# Patient Record
Sex: Male | Born: 1960
Health system: Southern US, Community
[De-identification: ages and names within clinical notes are randomized; demographics above are authoritative.]

## PROBLEM LIST (undated history)

## (undated) DIAGNOSIS — G4730 Sleep apnea, unspecified: Secondary | ICD-10-CM

## (undated) DIAGNOSIS — J189 Pneumonia, unspecified organism: Secondary | ICD-10-CM

## (undated) DIAGNOSIS — R0902 Hypoxemia: Secondary | ICD-10-CM

## (undated) DIAGNOSIS — G8929 Other chronic pain: Secondary | ICD-10-CM

## (undated) DIAGNOSIS — K9041 Non-celiac gluten sensitivity: Secondary | ICD-10-CM

## (undated) DIAGNOSIS — J449 Chronic obstructive pulmonary disease, unspecified: Secondary | ICD-10-CM

## (undated) DIAGNOSIS — T7840XA Allergy, unspecified, initial encounter: Secondary | ICD-10-CM

## (undated) DIAGNOSIS — M1990 Unspecified osteoarthritis, unspecified site: Secondary | ICD-10-CM

## (undated) HISTORY — DX: Sleep apnea, unspecified: G47.30

## (undated) HISTORY — DX: Unspecified osteoarthritis, unspecified site: M19.90

## (undated) HISTORY — DX: Hypoxemia: R09.02

## (undated) HISTORY — PX: MOLE REMOVAL: SHX2046

## (undated) HISTORY — DX: Non-celiac gluten sensitivity: K90.41

## (undated) HISTORY — DX: Pneumonia, unspecified organism: J18.9

## (undated) HISTORY — PX: BACK SURGERY: SHX140

## (undated) HISTORY — DX: Allergy, unspecified, initial encounter: T78.40XA

## (undated) HISTORY — PX: INGUINAL HERNIA REPAIR: SUR1180

## (undated) HISTORY — DX: Other chronic pain: G89.29

---

## 1981-08-06 DIAGNOSIS — F32A Depression, unspecified: Secondary | ICD-10-CM

## 1981-08-06 HISTORY — DX: Depression, unspecified: F32.A

## 2001-12-15 HISTORY — PX: SPINE SURGERY: SHX786

## 2003-03-04 HISTORY — PX: OTHER SURGICAL HISTORY: SHX169

## 2012-05-12 ENCOUNTER — Ambulatory Visit: Payer: Self-pay | Admitting: Surgery

## 2012-05-12 DIAGNOSIS — Z01810 Encounter for preprocedural cardiovascular examination: Secondary | ICD-10-CM

## 2012-05-12 LAB — BASIC METABOLIC PANEL
Calcium, Total: 8.8 mg/dL (ref 8.5–10.1)
Co2: 28 mmol/L (ref 21–32)
Creatinine: 0.9 mg/dL (ref 0.60–1.30)
Glucose: 65 mg/dL (ref 65–99)
Potassium: 4 mmol/L (ref 3.5–5.1)
Sodium: 138 mmol/L (ref 136–145)

## 2012-05-12 LAB — CBC WITH DIFFERENTIAL/PLATELET
Basophil #: 0.1 10*3/uL (ref 0.0–0.1)
Basophil %: 1 %
HCT: 46 % (ref 40.0–52.0)
HGB: 15.5 g/dL (ref 13.0–18.0)
Lymphocyte #: 2.3 10*3/uL (ref 1.0–3.6)
Lymphocyte %: 29.9 %
MCHC: 33.6 g/dL (ref 32.0–36.0)
Monocyte #: 0.8 x10 3/mm (ref 0.2–1.0)
Neutrophil #: 4.3 10*3/uL (ref 1.4–6.5)
Neutrophil %: 56.3 %
RBC: 4.62 10*6/uL (ref 4.40–5.90)

## 2012-05-17 ENCOUNTER — Ambulatory Visit: Payer: Self-pay | Admitting: Surgery

## 2012-11-11 ENCOUNTER — Ambulatory Visit: Payer: Self-pay | Admitting: Family Medicine

## 2013-03-03 HISTORY — PX: BACK SURGERY: SHX140

## 2013-05-30 ENCOUNTER — Ambulatory Visit: Payer: Self-pay | Admitting: Family Medicine

## 2013-08-31 ENCOUNTER — Ambulatory Visit: Payer: Self-pay | Admitting: Family Medicine

## 2014-02-01 ENCOUNTER — Ambulatory Visit: Payer: Self-pay | Admitting: Family Medicine

## 2014-03-03 HISTORY — PX: NECK SURGERY: SHX720

## 2014-06-23 NOTE — Op Note (Signed)
PATIENT NAME:  Ronald Ellis, Ronald Ellis MR#:  [id number] DATE OF BIRTH:  06-24-1960  DATE OF PROCEDURE:  05/17/2012  PREOPERATIVE DIAGNOSIS: Bilateral recurrent inguinal hernias.   POSTOPERATIVE  DIAGNOSIS:  Bilateral recurrent inguinal hernias.   PROCEDURE:  Laparoscopic preperitoneal repair of bilateral recurrent inguinal hernias.  SURGEON: Phoebe Perch, M.D.   ANESTHESIA: General with endotracheal tube.   INDICATIONS: This is a patient with bilateral recurrent inguinal hernias. He has had  3 operations on the left and 1 on the right. Mesh is present on the left, according to the patient.   Preoperatively, we discussed the rationale for surgery, the options of observation versus risks of bleeding, infection, recurrence, ischemic orchitis, chronic pain syndrome and neuroma. This was all reviewed for he and his wife in the preop holding area. They understood and agreed to proceed.   FINDINGS: Bilateral direct space recurrences. Extensive scar on the left side with palpable but not visible mesh present around the internal ring, extensive scar around the internal ring, and a small indirect space recurrent inguinal indirect sac. On the right side was noted a small indirect sac as well.   DESCRIPTION OF PROCEDURE: The patient was induced to general anesthesia, and given IV antibiotics. VTE prophylaxis was in place. He was prepped and draped in a sterile fashion. Marcaine was infiltrated in the skin and subcutaneous tissues around the periumbilical area (a Foley catheter had been placed as well). An incision was made. Dissection down to the right rectus fascia was performed. It was incised. The muscle retracted laterally. The [place] Surgical dissecting balloon was placed, followed by the [place] Surgical structural balloon, and the preperitoneal space was insufflated; and under direct vision, 2 midline 5 mm ports were placed. Lucious Zou's ligament was delineated. The lateral extent of dissection was determined. The cord was  skeletonized on the right and a small indirect sac was retracted cephalad. The nerve was kept in view at all times laterally.   On the left side, scarified material was brought down bluntly. The hernia sac was retracted cephalad after dissecting it free without making an obvious or visible rent in the hernia sac.   On each side, the pseudosacs from the direct space hernias were retracted and tacked to Eryx Zane's ligament to prevent seroma.   Into the preperitoneal space on each side was placed a large-sized 4 x 6 Atrium laterally-scissored mesh, which was held in place with the [place] Surgical tacker, avoiding the area of the nerve. The preperitoneal space was then desufflated under direct vision. All ports were removed. Fascial edges were approximated with 0 Vicryl figure-of-eight sutures and 4-0 subcuticular Monocryl was used at all skin edges. Steri-Strips, Mastisol and sterile dressings were placed.   The patient tolerated the procedure well. There were no complications. He was taken to the recovery room in stable condition to be discharged in the care of his family. Follow up in 10 days.     ____________________________ Jerrol Banana Burt Knack, MD rec:dm D: 05/17/2012 13:12:00 ET T: 05/17/2012 13:51:45 ET JOB#: [id number]  cc: Jerrol Banana. Burt Knack, MD, <Dictator> Florene Glen MD ELECTRONICALLY SIGNED 05/19/2012 12:28

## 2015-05-13 ENCOUNTER — Emergency Department: Payer: Self-pay

## 2015-05-13 ENCOUNTER — Encounter: Payer: Self-pay | Admitting: Emergency Medicine

## 2015-05-13 ENCOUNTER — Emergency Department
Admission: EM | Admit: 2015-05-13 | Discharge: 2015-05-13 | Disposition: A | Discharge status: Home / Self Care | Payer: Self-pay | Attending: Emergency Medicine | Admitting: Emergency Medicine

## 2015-05-13 DIAGNOSIS — R109 Unspecified abdominal pain: Secondary | ICD-10-CM

## 2015-05-13 DIAGNOSIS — Z87891 Personal history of nicotine dependence: Secondary | ICD-10-CM | POA: Insufficient documentation

## 2015-05-13 DIAGNOSIS — N200 Calculus of kidney: Secondary | ICD-10-CM

## 2015-05-13 HISTORY — DX: Chronic obstructive pulmonary disease, unspecified: J44.9

## 2015-05-13 LAB — URINALYSIS COMPLETE WITH MICROSCOPIC (ARMC ONLY)
BILIRUBIN URINE: NEGATIVE
Bacteria, UA: NONE SEEN
Glucose, UA: NEGATIVE mg/dL
LEUKOCYTES UA: NEGATIVE
Nitrite: NEGATIVE
PH: 5 (ref 5.0–8.0)
PROTEIN: 30 mg/dL — AB
Specific Gravity, Urine: 1.029 (ref 1.005–1.030)

## 2015-05-13 LAB — CBC WITH DIFFERENTIAL/PLATELET
BASOS PCT: 1 %
Basophils Absolute: 0.1 10*3/uL (ref 0–0.1)
EOS ABS: 0.3 10*3/uL (ref 0–0.7)
EOS PCT: 3 %
HCT: 48.2 % (ref 40.0–52.0)
Hemoglobin: 16.5 g/dL (ref 13.0–18.0)
Lymphocytes Relative: 38 %
Lymphs Abs: 4.6 10*3/uL — ABNORMAL HIGH (ref 1.0–3.6)
MCH: 33.5 pg (ref 26.0–34.0)
MCHC: 34.2 g/dL (ref 32.0–36.0)
MCV: 98 fL (ref 80.0–100.0)
MONO ABS: 0.8 10*3/uL (ref 0.2–1.0)
MONOS PCT: 7 %
Neutro Abs: 6.1 10*3/uL (ref 1.4–6.5)
Neutrophils Relative %: 51 %
PLATELETS: 367 10*3/uL (ref 150–440)
RBC: 4.91 MIL/uL (ref 4.40–5.90)
RDW: 13.3 % (ref 11.5–14.5)
WBC: 12 10*3/uL — ABNORMAL HIGH (ref 3.8–10.6)

## 2015-05-13 LAB — COMPREHENSIVE METABOLIC PANEL
ALBUMIN: 4.1 g/dL (ref 3.5–5.0)
ALT: 35 U/L (ref 17–63)
ANION GAP: 8 (ref 5–15)
AST: 26 U/L (ref 15–41)
Alkaline Phosphatase: 65 U/L (ref 38–126)
BILIRUBIN TOTAL: 1.1 mg/dL (ref 0.3–1.2)
BUN: 14 mg/dL (ref 6–20)
CHLORIDE: 108 mmol/L (ref 101–111)
CO2: 22 mmol/L (ref 22–32)
Calcium: 8.8 mg/dL — ABNORMAL LOW (ref 8.9–10.3)
Creatinine, Ser: 1.08 mg/dL (ref 0.61–1.24)
GFR calc Af Amer: >60 mL/min (ref >60)
GFR calc non Af Amer: >60 mL/min (ref >60)
GLUCOSE: 101 mg/dL — AB (ref 65–99)
POTASSIUM: 4.1 mmol/L (ref 3.5–5.1)
SODIUM: 138 mmol/L (ref 135–145)
TOTAL PROTEIN: 7 g/dL (ref 6.5–8.1)

## 2015-05-13 LAB — LIPASE, BLOOD: Lipase: 14 U/L (ref 11–51)

## 2015-05-13 MED ORDER — MORPHINE SULFATE (PF) 4 MG/ML IV SOLN
4.0000 mg | Freq: Once | INTRAVENOUS | Status: AC
  Administered 2015-05-13: 4 mg via INTRAVENOUS
  Filled 2015-05-13: qty 1

## 2015-05-13 MED ORDER — TAMSULOSIN HCL 0.4 MG PO CAPS: 0.4000 mg | ORAL_CAPSULE | Freq: Every day | ORAL | Status: DC

## 2015-05-13 MED ORDER — ONDANSETRON HCL 4 MG/2ML IJ SOLN
4.0000 mg | Freq: Once | INTRAMUSCULAR | Status: AC
  Administered 2015-05-13: 4 mg via INTRAVENOUS
  Filled 2015-05-13: qty 2

## 2015-05-13 MED ORDER — OXYCODONE-ACETAMINOPHEN 5-325 MG PO TABS: 1.0000 | ORAL_TABLET | Freq: Four times a day (QID) | ORAL | Status: DC | PRN

## 2015-05-13 MED ORDER — SODIUM CHLORIDE 0.9 % IV BOLUS (SEPSIS)
1000.0000 mL | Freq: Once | INTRAVENOUS | Status: AC
  Administered 2015-05-13: 1000 mL via INTRAVENOUS

## 2015-05-13 MED ORDER — KETOROLAC TROMETHAMINE 30 MG/ML IJ SOLN
30.0000 mg | Freq: Once | INTRAMUSCULAR | Status: AC
  Administered 2015-05-13: 30 mg via INTRAVENOUS
  Filled 2015-05-13: qty 1

## 2015-05-13 NOTE — ED Notes (Signed)
Pt arrived by POV to ED w/ c/o right side abdominal pain rated at a 10 w/ n/v. Pt has a hx of kidney stones.

## 2015-05-13 NOTE — ED Provider Notes (Signed)
[facility] Emergency Department Provider Note  ____________________________________________  Time seen: Approximately 1:45 PM  I have reviewed the triage vital signs and the nursing notes.   HISTORY  Chief Complaint Abdominal Pain    HPI Ronald Ellis is a 55 y.o. male with a history of kidney stones with presenting to the emergency department today with sudden onset right lower quadrant abdominal pain. He says the pain is sharp and a 10 out of 10. He has not noticed any blood in his urine for the last time he had a stone was about 12-13 years ago. Has vomited multiple times since the pain started. No radiation noted. No difficulty urinating.   Past Medical History  Diagnosis Date  . COPD (chronic obstructive pulmonary disease) (HCC)     There are no active problems to display for this patient.   Past Surgical History  Procedure Laterality Date  . Back surgery    . Hernia repair    . Neck surgery      No current outpatient prescriptions on file.  Allergies Review of patient's allergies indicates no known allergies.  History reviewed. No pertinent family history.  Social History Social History  Substance Use Topics  . Smoking status: Former Research scientist (life sciences)  . Smokeless tobacco: None  . Alcohol Use: None    Review of Systems Constitutional: No fever/chills Eyes: No visual changes. ENT: No sore throat. Cardiovascular: Denies chest pain. Respiratory: Denies shortness of breath. Gastrointestinal: No diarrhea.  No constipation. Genitourinary: Negative for dysuria. Musculoskeletal: Negative for back pain. Skin: Negative for rash. Neurological: Negative for headaches, focal weakness or numbness.  10-point ROS otherwise negative.  ____________________________________________   PHYSICAL EXAM:  VITAL SIGNS: ED Triage Vitals  Enc Vitals Group     BP --      Pulse --      Resp --      Temp --      Temp src --      SpO2 --      Weight --       Height --      Head Cir --      Peak Flow --      Pain Score --      Pain Loc --      Pain Edu? --      Excl. in [place]? --     Constitutional: Alert and oriented. Mild to moderate distress. Cannot find a position of comfort. Has an emesis bag with him on the bed with about 30 cc of clear to brownish tinged vomitus. No coffee grounds. No bright red blood. Eyes: Conjunctivae are normal. PERRL. EOMI. Head: Atraumatic. Nose: No congestion/rhinnorhea. Mouth/Throat: Mucous membranes are moist.   Neck: No stridor.   Cardiovascular: Normal rate, regular rhythm. Grossly normal heart sounds.  Good peripheral circulation. Respiratory: Normal respiratory effort.  No retractions. Lungs CTAB. Gastrointestinal: Soft with right lower quadrant tenderness to palpation. No distention.  No CVA tenderness. Musculoskeletal: No lower extremity tenderness nor edema.  No joint effusions. Neurologic:  Normal speech and language. No gross focal neurologic deficits are appreciated. No gait instability. Skin:  Skin is warm, dry and intact. No rash noted. Psychiatric: Mood and affect are normal. Speech and behavior are normal.  ____________________________________________   LABS (all labs ordered are listed, but only abnormal results are displayed)  Labs Reviewed  URINALYSIS COMPLETEWITH MICROSCOPIC ([facility] ONLY) - Abnormal; Notable for the following:    Color, Urine AMBER (*)    APPearance HAZY (*)  Ketones, ur TRACE (*)    Hgb urine dipstick 3+ (*)    Protein, ur 30 (*)    Squamous Epithelial / LPF 0-5 (*)    All other components within normal limits  CBC WITH DIFFERENTIAL/PLATELET - Abnormal; Notable for the following:    WBC 12.0 (*)    Lymphs Abs 4.6 (*)    All other components within normal limits  COMPREHENSIVE METABOLIC PANEL - Abnormal; Notable for the following:    Glucose, Bld 101 (*)    Calcium 8.8 (*)    All other components within normal limits  LIPASE, BLOOD    ____________________________________________  EKG   ____________________________________________  RADIOLOGY   IMPRESSION: 1. 2 mm right UVJ stone causes mild right hydroureteronephrosis. 2. Other tiny nonobstructing renal calculi.   Electronically Signed By: Misty Stanley M.D. On: 05/13/2015 14:48 ____________________________________________   PROCEDURES   ____________________________________________   INITIAL IMPRESSION / ASSESSMENT AND PLAN / ED COURSE  Pertinent labs & imaging results that were available during my care of the patient were reviewed by me and considered in my medical decision making (see chart for details).  ----------------------------------------- 3:19 PM on 05/13/2015 -----------------------------------------  Patient with 0 out of 10 pain now after Toradol and morphine. He is no longer vomiting. I discussed his lab results as well as his CT imaging which shows a 2 mm right UVJ stone with right mild hydroureteronephrosis. He knows drinking plenty of water home. He'll be discharged with Flomax as well as Percocet. He will follow-up with Dr. Jacqlyn Larsen, who he knows to his wife. We also discussed return precautions is any worsening or concerning symptoms, especially fever, chills worsening pain or nausea or vomiting. Given strainer. ____________________________________________   FINAL CLINICAL IMPRESSION(S) / ED DIAGNOSES  Final diagnoses:  Right flank pain   right-sided kidney stone.    Orbie Pyo, MD 05/13/15 1520

## 2015-05-13 NOTE — Discharge Instructions (Signed)
Kidney Stones °Kidney stones (urolithiasis) are deposits that form inside your kidneys. The intense pain is caused by the stone moving through the urinary tract. When the stone moves, the ureter goes into spasm around the stone. The stone is usually passed in the urine.  °CAUSES  °· A disorder that makes certain neck glands produce too much parathyroid hormone (primary hyperparathyroidism). °· A buildup of uric acid crystals, similar to gout in your joints. °· Narrowing (stricture) of the ureter. °· A kidney obstruction present at birth (congenital obstruction). °· Previous surgery on the kidney or ureters. °· Numerous kidney infections. °SYMPTOMS  °· Feeling sick to your stomach (nauseous). °· Throwing up (vomiting). °· Blood in the urine (hematuria). °· Pain that usually spreads (radiates) to the groin. °· Frequency or urgency of urination. °DIAGNOSIS  °· Taking a history and physical exam. °· Blood or urine tests. °· CT scan. °· Occasionally, an examination of the inside of the urinary bladder (cystoscopy) is performed. °TREATMENT  °· Observation. °· Increasing your fluid intake. °· Extracorporeal shock wave lithotripsy--This is a noninvasive procedure that uses shock waves to break up kidney stones. °· Surgery may be needed if you have severe pain or persistent obstruction. There are various surgical procedures. Most of the procedures are performed with the use of small instruments. Only small incisions are needed to accommodate these instruments, so recovery time is minimized. °The size, location, and chemical composition are all important variables that will determine the proper choice of action for you. Talk to your health care provider to better understand your situation so that you will minimize the risk of injury to yourself and your kidney.  °HOME CARE INSTRUCTIONS  °· Drink enough water and fluids to keep your urine clear or pale yellow. This will help you to pass the stone or stone fragments. °· Strain  all urine through the provided strainer. Keep all particulate matter and stones for your health care provider to see. The stone causing the pain may be as small as a grain of salt. It is very important to use the strainer each and every time you pass your urine. The collection of your stone will allow your health care provider to analyze it and verify that a stone has actually passed. The stone analysis will often identify what you can do to reduce the incidence of recurrences. °· Only take over-the-counter or prescription medicines for pain, discomfort, or fever as directed by your health care provider. °· Keep all follow-up visits as told by your health care provider. This is important. °· Get follow-up X-rays if required. The absence of pain does not always mean that the stone has passed. It may have only stopped moving. If the urine remains completely obstructed, it can cause loss of kidney function or even complete destruction of the kidney. It is your responsibility to make sure X-rays and follow-ups are completed. Ultrasounds of the kidney can show blockages and the status of the kidney. Ultrasounds are not associated with any radiation and can be performed easily in a matter of minutes. °· Make changes to your daily diet as told by your health care provider. You may be told to: °¨ Limit the amount of salt that you eat. °¨ Eat 5 or more servings of fruits and vegetables each day. °¨ Limit the amount of meat, poultry, fish, and eggs that you eat. °· Collect a 24-hour urine sample as told by your health care provider. You may need to collect another urine sample every 6-12   months. °SEEK MEDICAL CARE IF: °· You experience pain that is progressive and unresponsive to any pain medicine you have been prescribed. °SEEK IMMEDIATE MEDICAL CARE IF:  °· Pain cannot be controlled with the prescribed medicine. °· You have a fever or shaking chills. °· The severity or intensity of pain increases over 18 hours and is not  relieved by pain medicine. °· You develop a new onset of abdominal pain. °· You feel faint or pass out. °· You are unable to urinate. °  °This information is not intended to replace advice given to you by your health care provider. Make sure you discuss any questions you have with your health care provider. °  °Document Released: 02/17/2005 Document Revised: 11/08/2014 Document Reviewed: 07/21/2012 °Elsevier Interactive Patient Education ©2016 Else[employer] Inc[employer] ° °

## 2015-07-19 ENCOUNTER — Encounter: Payer: Self-pay | Admitting: Family Medicine

## 2015-07-19 ENCOUNTER — Ambulatory Visit (INDEPENDENT_AMBULATORY_CARE_PROVIDER_SITE_OTHER): Payer: Self-pay | Admitting: Family Medicine

## 2015-07-19 VITALS — BP 108/70 | HR 76 | Temp 98.2°F | Resp 18 | Ht 73.0 in | Wt 196.7 lb

## 2015-07-19 DIAGNOSIS — R5383 Other fatigue: Secondary | ICD-10-CM | POA: Insufficient documentation

## 2015-07-19 DIAGNOSIS — F1190 Opioid use, unspecified, uncomplicated: Secondary | ICD-10-CM

## 2015-07-19 DIAGNOSIS — G8929 Other chronic pain: Principal | ICD-10-CM

## 2015-07-19 DIAGNOSIS — M542 Cervicalgia: Secondary | ICD-10-CM

## 2015-07-19 DIAGNOSIS — M549 Dorsalgia, unspecified: Secondary | ICD-10-CM

## 2015-07-19 NOTE — Progress Notes (Signed)
Name: Ronald Ellis   MRN: [id number]    DOB: 27-Jul-1960   Date:07/19/2015       Progress Note  Subjective  Chief Complaint  Chief Complaint  Patient presents with  . Back Pain    follow up from workers comp  . Neck Pain  . COPD    HPI  Fatigue: Pt. Reports no energy, feels not able to be as active as before (playing with grandkids etc), feeling tired and sluggish, feels like his memory is failing him.  Recently has multiple back and neck surgeries (fusion in cervical spine, and lumbar spine). Now on chronic pain medications (tramadol, Oxycodone, and Tylenol).    Past Medical History  Diagnosis Date  . COPD (chronic obstructive pulmonary disease) [facility])     Past Surgical History  Procedure Laterality Date  . Back surgery    . Hernia repair    . Neck surgery      History reviewed. No pertinent family history.  Social History   Social History  . Marital Status: Single    Spouse Name: N/A  . Number of Children: N/A  . Years of Education: N/A   Occupational History  . Not on file.   Social History Main Topics  . Smoking status: Former Research scientist (life sciences)  . Smokeless tobacco: Not on file  . Alcohol Use: Not on file  . Drug Use: Not on file  . Sexual Activity: Not on file   Other Topics Concern  . Not on file   Social History Narrative     Current outpatient prescriptions:  .  traMADol (ULTRAM-ER) 100 MG 24 hr tablet, Take 100 mg by mouth 4 (four) times daily., Disp: , Rfl:  .  oxyCODONE-acetaminophen (ROXICET) 5-325 MG tablet, Take 1-2 tablets by mouth every 6 (six) hours as needed., Disp: 12 tablet, Rfl: 0  No Known Allergies   Review of Systems  Constitutional: Positive for malaise/fatigue. Negative for fever, chills and weight loss.  Respiratory: Negative for cough.   Cardiovascular: Positive for chest pain (occasional chest tightness.).  Gastrointestinal: Negative for abdominal pain, blood in stool and melena.  Musculoskeletal: Positive for myalgias, back pain  and neck pain.    Objective  Filed Vitals:   07/19/15 1044  BP: 108/70  Pulse: 76  Temp: 98.2 F (36.8 C)  TempSrc: Oral  Resp: 18  Height: 6' 1"  (1.854 m)  Weight: 196 lb 11.2 oz (89.223 kg)  SpO2: 96%    Physical Exam  Constitutional: He is oriented to person, place, and time and well-developed, well-nourished, and in no distress.  HENT:  Head: Normocephalic and atraumatic.  Cardiovascular: Normal rate and regular rhythm.   Pulmonary/Chest: Effort normal and breath sounds normal.  Abdominal: Soft. Bowel sounds are normal.  Musculoskeletal:       Cervical back: He exhibits tenderness, pain and spasm.       Back:  Neurological: He is alert and oriented to person, place, and time.  Psychiatric: Mood, memory, affect and judgment normal.  Nursing note and vitals reviewed.      Assessment & Plan  1. Chronic neck and back pain We'll request records from orthopedic surgery.  2. Chronic, continuous use of opioids We will refer to pain clinic to be able to continue to receive his opioids - Ambulatory referral to Pain Clinic  3. Other fatigue Could be multifactorial (patient has history of depression and has been on SSRI), obtain laboratory evaluation and consider starting back on SSRI if unremarkable - CBC with Differential -  Comprehensive Metabolic Panel (CMET) - TSH - Vitamin D (25 hydroxy)   Ojas Coone Asad A[facility] 07/19/2015 10:50 AM

## 2015-10-15 ENCOUNTER — Ambulatory Visit (INDEPENDENT_AMBULATORY_CARE_PROVIDER_SITE_OTHER): Payer: Self-pay | Admitting: Family Medicine

## 2015-10-15 DIAGNOSIS — M722 Plantar fascial fibromatosis: Secondary | ICD-10-CM | POA: Insufficient documentation

## 2015-10-15 MED ORDER — MELOXICAM 15 MG PO TABS: 15.0000 mg | ORAL_TABLET | Freq: Every day | ORAL | 0 refills | Status: DC

## 2015-10-15 NOTE — Progress Notes (Signed)
Name: Ronald Ellis   MRN: [id number]    DOB: 05/27/1960   Date:10/15/2015       Progress Note  Subjective  Chief Complaint  Chief Complaint  Patient presents with  . Pain    bilateral feet, neck and back    Lower Extremity Injury   Right Foot Pain: Gradual onset,Present for last 2-3 weeks mouth unchanged in intensity, pain present in the heel of right foot, radiates forward to the distal foot, worse in the morning and when he sits down for prolonged periods, no leg pain.  Walking makes it worse,    Past Medical History:  Diagnosis Date  . COPD (chronic obstructive pulmonary disease) ([place])     Past Surgical History:  Procedure Laterality Date  . BACK SURGERY    . HERNIA REPAIR    . NECK SURGERY      No family history on file.  Social History   Social History  . Marital status: Single    Spouse name: N/A  . Number of children: N/A  . Years of education: N/A   Occupational History  . Not on file.   Social History Main Topics  . Smoking status: Former Research scientist (life sciences)  . Smokeless tobacco: Not on file  . Alcohol use Not on file  . Drug use: Unknown  . Sexual activity: Not on file   Other Topics Concern  . Not on file   Social History Narrative  . No narrative on file     Current Outpatient Prescriptions:  .  traMADol (ULTRAM-ER) 100 MG 24 hr tablet, Take 100 mg by mouth 4 (four) times daily., Disp: , Rfl:   No Known Allergies   Review of Systems  Constitutional: Negative for chills and fever.  Musculoskeletal: Positive for joint pain.     Objective  Vitals:   10/15/15 1428  BP: 104/72  Pulse: 100  Resp: 14  Temp: 98.4 F (36.9 C)  TempSrc: Oral  SpO2: 96%  Weight: 194 lb 4.8 oz (88.1 kg)    Physical Exam  Constitutional: He is well-developed, well-nourished, and in no distress.  Musculoskeletal:       Right foot: There is tenderness. There is no swelling.       Feet:  Marked tenderness to palpation over the medial base of the right heel, no  visible erythema, foot is neurovascularly intact, normal range of motion  Psychiatric: Mood, memory, affect and judgment normal.  Nursing note and vitals reviewed.     Assessment & Plan  1. Plantar fasciitis of right foot Obtain x-ray to rule out osseous pathology. Started on NSAID therapy, encouraged stretching exercises, explained the mechanism and possible etiologies for plantar fasciitis. If no relief on conservative therapy, may need referral to podiatry - DG Foot Complete Right; Future - meloxicam (MOBIC) 15 MG tablet; Take 1 tablet (15 mg total) by mouth daily.  Dispense: 30 tablet; Refill: 0   Nita Whitmire Asad A. [facility] 10/15/2015 3:20 PM

## 2015-10-19 ENCOUNTER — Ambulatory Visit: Payer: Self-pay | Admitting: Family Medicine

## 2015-12-05 ENCOUNTER — Encounter: Payer: Self-pay | Admitting: Family Medicine

## 2015-12-05 ENCOUNTER — Ambulatory Visit
Admission: RE | Admit: 2015-12-05 | Discharge: 2015-12-05 | Disposition: A | Discharge status: Home / Self Care | Payer: Self-pay | Source: Ambulatory Visit | Attending: Family Medicine | Admitting: Family Medicine

## 2015-12-05 ENCOUNTER — Ambulatory Visit (INDEPENDENT_AMBULATORY_CARE_PROVIDER_SITE_OTHER): Payer: Self-pay | Admitting: Family Medicine

## 2015-12-05 VITALS — BP 108/70 | HR 73 | Temp 98.9°F | Resp 18 | Ht 73.0 in | Wt 194.2 lb

## 2015-12-05 DIAGNOSIS — M5441 Lumbago with sciatica, right side: Secondary | ICD-10-CM

## 2015-12-05 DIAGNOSIS — M5136 Other intervertebral disc degeneration, lumbar region: Secondary | ICD-10-CM | POA: Insufficient documentation

## 2015-12-05 DIAGNOSIS — R5383 Other fatigue: Secondary | ICD-10-CM

## 2015-12-05 DIAGNOSIS — Z9889 Other specified postprocedural states: Secondary | ICD-10-CM | POA: Insufficient documentation

## 2015-12-05 DIAGNOSIS — F32A Depression, unspecified: Secondary | ICD-10-CM

## 2015-12-05 DIAGNOSIS — M722 Plantar fascial fibromatosis: Secondary | ICD-10-CM

## 2015-12-05 DIAGNOSIS — F329 Major depressive disorder, single episode, unspecified: Secondary | ICD-10-CM

## 2015-12-05 DIAGNOSIS — M79671 Pain in right foot: Secondary | ICD-10-CM | POA: Insufficient documentation

## 2015-12-05 DIAGNOSIS — M7731 Calcaneal spur, right foot: Secondary | ICD-10-CM | POA: Insufficient documentation

## 2015-12-05 MED ORDER — ESCITALOPRAM OXALATE 10 MG PO TABS: 10.0000 mg | ORAL_TABLET | Freq: Every day | ORAL | 0 refills | Status: DC

## 2015-12-05 NOTE — Progress Notes (Signed)
Name: Ronald Ellis   MRN: [id number]    DOB: 07-Jun-1960   Date:12/05/2015       Progress Note  Subjective  Chief Complaint  Chief Complaint  Patient presents with  . Back Pain    pain now radiateing down legs   . Foot Pain    follow up  . Fatigue    Back Pain  This is a chronic problem. The pain is present in the lumbar spine. The pain radiates to the right thigh and right foot. The pain is at a severity of 6/10. Associated symptoms include headaches and leg pain. Pertinent negatives include no bladder incontinence or bowel incontinence. He has tried analgesics (had had lumbar fusion in 2015, now taking Tramadol 50 mg tablets.) for the symptoms.     Past Medical History:  Diagnosis Date  . COPD (chronic obstructive pulmonary disease) ([place])     Past Surgical History:  Procedure Laterality Date  . BACK SURGERY    . HERNIA REPAIR    . NECK SURGERY      No family history on file.  Social History   Social History  . Marital status: Single    Spouse name: N/A  . Number of children: N/A  . Years of education: N/A   Occupational History  . Not on file.   Social History Main Topics  . Smoking status: Former Research scientist (life sciences)  . Smokeless tobacco: Not on file  . Alcohol use Not on file  . Drug use: Unknown  . Sexual activity: Not on file   Other Topics Concern  . Not on file   Social History Narrative  . No narrative on file     Current Outpatient Prescriptions:  .  traMADol (ULTRAM-ER) 100 MG 24 hr tablet, Take 100 mg by mouth 4 (four) times daily., Disp: , Rfl:   No Known Allergies   Review of Systems  Constitutional: Positive for malaise/fatigue.  Gastrointestinal: Negative for bowel incontinence.  Genitourinary: Negative for bladder incontinence.  Musculoskeletal: Positive for back pain and joint pain.  Neurological: Positive for headaches.     Objective  Vitals:   12/05/15 1046  BP: 108/70  Pulse: 73  Resp: 18  Temp: 98.9 F (37.2 C)  TempSrc: Oral    SpO2: 97%  Weight: 194 lb 3.2 oz (88.1 kg)  Height: 6' 1"  (1.854 m)    Physical Exam  Constitutional: He is well-developed, well-nourished, and in no distress.  HENT:  Head: Normocephalic and atraumatic.  Cardiovascular: Normal rate, regular rhythm, S1 normal, S2 normal and normal heart sounds.   No murmur heard. Pulmonary/Chest: Effort normal and breath sounds normal. He has no wheezes. He has no rhonchi.  Musculoskeletal:       Lumbar back: He exhibits tenderness, pain and spasm.       Back:  Right low back tenderness with associated muscle spasm, SLR positive on right side at 60 degrees.  Psychiatric: Mood, memory, affect and judgment normal.  Nursing note and vitals reviewed.      Assessment & Plan  1. Plantar fasciitis of right foot Patient has not been able to obtain the x-ray ordered at last visit. We'll reauthorize x-ray and evaluate.  2. Acute right-sided low back pain with right-sided sciatica Status post lumbar fusion, obtain x-ray of lumbar spine for evaluation - DG Lumbar Spine Complete; Future  3. Fatigue due to depression Started on Lexapro to increase energy, obtain lab work ordered previously to rule out any organic etiology for fatigue. discontinue tramadol. -  escitalopram (LEXAPRO) 10 MG tablet; Take 1 tablet (10 mg total) by mouth at bedtime.  Dispense: 90 tablet; Refill: 0   Caidence Kaseman Asad A[facility] 12/05/2015 11:00 AM

## 2016-01-16 ENCOUNTER — Encounter: Payer: Self-pay | Admitting: Family Medicine

## 2016-01-16 ENCOUNTER — Ambulatory Visit (INDEPENDENT_AMBULATORY_CARE_PROVIDER_SITE_OTHER): Payer: Self-pay | Admitting: Family Medicine

## 2016-01-16 VITALS — BP 110/75 | HR 88 | Temp 98.1°F | Resp 17 | Ht 73.0 in | Wt 200.2 lb

## 2016-01-16 DIAGNOSIS — M722 Plantar fascial fibromatosis: Secondary | ICD-10-CM

## 2016-01-16 DIAGNOSIS — F32A Depression, unspecified: Secondary | ICD-10-CM

## 2016-01-16 DIAGNOSIS — F329 Major depressive disorder, single episode, unspecified: Secondary | ICD-10-CM

## 2016-01-16 DIAGNOSIS — R5383 Other fatigue: Secondary | ICD-10-CM

## 2016-01-16 LAB — COMPREHENSIVE METABOLIC PANEL
ALK PHOS: 72 U/L (ref 40–115)
ALT: 26 U/L (ref 9–46)
AST: 19 U/L (ref 10–35)
Albumin: 4.3 g/dL (ref 3.6–5.1)
BILIRUBIN TOTAL: 0.9 mg/dL (ref 0.2–1.2)
BUN: 11 mg/dL (ref 7–25)
CALCIUM: 9.9 mg/dL (ref 8.6–10.3)
CO2: 26 mmol/L (ref 20–31)
Chloride: 105 mmol/L (ref 98–110)
Creat: 1 mg/dL (ref 0.70–1.33)
GLUCOSE: 101 mg/dL — AB (ref 65–99)
POTASSIUM: 4.9 mmol/L (ref 3.5–5.3)
Sodium: 139 mmol/L (ref 135–146)
TOTAL PROTEIN: 7 g/dL (ref 6.1–8.1)

## 2016-01-16 LAB — TSH: TSH: 1.2 m[IU]/L (ref 0.40–4.50)

## 2016-01-16 MED ORDER — ESCITALOPRAM OXALATE 20 MG PO TABS: 20.0000 mg | ORAL_TABLET | Freq: Every day | ORAL | 0 refills | Status: DC

## 2016-01-16 MED ORDER — MELOXICAM 15 MG PO TABS: 15.0000 mg | ORAL_TABLET | Freq: Every day | ORAL | 2 refills | Status: DC

## 2016-01-16 NOTE — Progress Notes (Signed)
Name: Ronald Ellis   MRN: [id number]    DOB: Nov 25, 1960   Date:01/16/2016       Progress Note  Subjective  Chief Complaint  Chief Complaint  Patient presents with  . Follow-up    6 wk  . Medication Refill    HPI  Fatigue: Pt. Presents for evaluation of fatigue, described as being 'no energy zero focus', started feeling this way in March 2017. He gets 'winter depression' every year but this year, he did not get better with spring and summer. He stays tired all day, lab work to evaluate for the causes of fatigue has not been done. He was started on Lexapro 10 mg daily which does not seem to be helping (but pt. admits to not taking the medication as prescribed).   Past Medical History:  Diagnosis Date  . COPD (chronic obstructive pulmonary disease) ([place])     Past Surgical History:  Procedure Laterality Date  . BACK SURGERY    . HERNIA REPAIR    . NECK SURGERY      History reviewed. No pertinent family history.  Social History   Social History  . Marital status: Single    Spouse name: N/A  . Number of children: N/A  . Years of education: N/A   Occupational History  . Not on file.   Social History Main Topics  . Smoking status: Former Research scientist (life sciences)  . Smokeless tobacco: Never Used  . Alcohol use Not on file  . Drug use: Unknown  . Sexual activity: Not on file   Other Topics Concern  . Not on file   Social History Narrative  . No narrative on file     Current Outpatient Prescriptions:  .  escitalopram (LEXAPRO) 10 MG tablet, Take 1 tablet (10 mg total) by mouth at bedtime., Disp: 90 tablet, Rfl: 0  No Known Allergies   Review of Systems  Constitutional: Positive for malaise/fatigue.  Respiratory: Negative for cough.   Cardiovascular: Negative for chest pain.  Gastrointestinal: Negative for abdominal pain.  Musculoskeletal: Positive for joint pain.  Psychiatric/Behavioral: Positive for depression.    Objective  Vitals:   01/16/16 0959  BP: 110/75    Pulse: 88  Resp: 17  Temp: 98.1 F (36.7 C)  TempSrc: Oral  SpO2: 98%  Weight: 200 lb 3.2 oz (90.8 kg)  Height: 6' 1"  (1.854 m)    Physical Exam  Constitutional: He is oriented to person, place, and time and well-developed, well-nourished, and in no distress.  HENT:  Head: Normocephalic and atraumatic.  Cardiovascular: Normal rate, regular rhythm and normal heart sounds.   No murmur heard. Pulmonary/Chest: Effort normal and breath sounds normal.  Musculoskeletal:       Right foot: There is tenderness. There is no swelling.       Feet:  Neurological: He is alert and oriented to person, place, and time.  Psychiatric: Mood, memory, affect and judgment normal.  Nursing note and vitals reviewed.    Assessment & Plan  1. Fatigue due to depression Increase Lexapro to 20 mg daily, obtain lab work for evaluation of potential causes of fatigue including low testosterone levels - escitalopram (LEXAPRO) 20 MG tablet; Take 1 tablet (20 mg total) by mouth at bedtime.  Dispense: 90 tablet; Refill: 0 - Testosterone  2. Plantar fasciitis of right foot X-ray reveals a calcaneal spur, patient has been taking meloxicam 15 mg daily. Referred to podiatry. - meloxicam (MOBIC) 15 MG tablet; Take 1 tablet (15 mg total) by mouth daily.  Dispense: 30 tablet; Refill: 2 - Ambulatory referral to [place]. [facility] 01/16/2016 10:10 AM

## 2016-01-17 LAB — TESTOSTERONE: Testosterone: 542 ng/dL (ref 250–827)

## 2016-01-17 LAB — CBC WITH DIFFERENTIAL/PLATELET
BASOS PCT: 1 %
Basophils Absolute: 78 cells/uL (ref 0–200)
EOS ABS: 234 {cells}/uL (ref 15–500)
Eosinophils Relative: 3 %
HEMATOCRIT: 50.3 % — AB (ref 38.5–50.0)
HEMOGLOBIN: 16.8 g/dL (ref 13.2–17.1)
LYMPHS ABS: 3042 {cells}/uL (ref 850–3900)
LYMPHS PCT: 39 %
MCH: 33.5 pg — ABNORMAL HIGH (ref 27.0–33.0)
MCHC: 33.4 g/dL (ref 32.0–36.0)
MCV: 100.2 fL — AB (ref 80.0–100.0)
MONO ABS: 624 {cells}/uL (ref 200–950)
MPV: 9.3 fL (ref 7.5–12.5)
Monocytes Relative: 8 %
NEUTROS ABS: 3822 {cells}/uL (ref 1500–7800)
Neutrophils Relative %: 49 %
Platelets: 314 10*3/uL (ref 140–400)
RBC: 5.02 MIL/uL (ref 4.20–5.80)
RDW: 13.8 % (ref 11.0–15.0)
WBC: 7.8 10*3/uL (ref 3.8–10.8)

## 2016-01-17 LAB — VITAMIN D 25 HYDROXY (VIT D DEFICIENCY, FRACTURES): Vit D, 25-Hydroxy: 35 ng/mL (ref 30–100)

## 2016-02-11 ENCOUNTER — Ambulatory Visit: Payer: Self-pay | Admitting: Podiatry

## 2016-02-15 ENCOUNTER — Ambulatory Visit (INDEPENDENT_AMBULATORY_CARE_PROVIDER_SITE_OTHER): Payer: Self-pay | Admitting: Family Medicine

## 2016-02-15 ENCOUNTER — Encounter: Payer: Self-pay | Admitting: Family Medicine

## 2016-02-15 VITALS — BP 120/80 | HR 83 | Temp 98.6°F | Resp 18 | Ht 73.0 in | Wt 200.9 lb

## 2016-02-15 DIAGNOSIS — M549 Dorsalgia, unspecified: Secondary | ICD-10-CM

## 2016-02-15 DIAGNOSIS — M722 Plantar fascial fibromatosis: Secondary | ICD-10-CM

## 2016-02-15 DIAGNOSIS — M542 Cervicalgia: Secondary | ICD-10-CM

## 2016-02-15 DIAGNOSIS — F329 Major depressive disorder, single episode, unspecified: Secondary | ICD-10-CM

## 2016-02-15 DIAGNOSIS — G8929 Other chronic pain: Secondary | ICD-10-CM

## 2016-02-15 DIAGNOSIS — R5383 Other fatigue: Secondary | ICD-10-CM

## 2016-02-15 MED ORDER — HYDROCODONE-ACETAMINOPHEN 5-325 MG PO TABS: 1.0000 | ORAL_TABLET | Freq: Two times a day (BID) | ORAL | 0 refills | Status: DC | PRN

## 2016-02-15 MED ORDER — MELOXICAM 15 MG PO TABS: 15.0000 mg | ORAL_TABLET | Freq: Every day | ORAL | 2 refills | Status: DC

## 2016-02-15 MED ORDER — ESCITALOPRAM OXALATE 20 MG PO TABS: 20.0000 mg | ORAL_TABLET | Freq: Every day | ORAL | 0 refills | Status: DC

## 2016-02-15 NOTE — Progress Notes (Signed)
Name: Ronald Ellis   MRN: [id number]    DOB: 04-May-1960   Date:02/15/2016       Progress Note  Subjective  Chief Complaint  Chief Complaint  Patient presents with  . Follow-up    1 month recheck, medication refills    HPI  Fatigue Has been diagnosed with fatigue, likely caused by depression, started on Lexapro initially at 10 mg, then increase to 20 mg at last visit, feels better, has more energy, no side effects, request consideration of therapy.  Foot Pain Patient has foot pain, rule been diagnosed with plantar fasciitis, taking meloxicam but taking 15 mg twice daily instead of once daily as prescribed because it helps with neck pain, back pain and foot pain. He has been referred to podiatrist but could not keep the appointment and has a follow-up scheduled on December 27.    Past Medical History:  Diagnosis Date  . COPD (chronic obstructive pulmonary disease) ([place])     Past Surgical History:  Procedure Laterality Date  . BACK SURGERY    . HERNIA REPAIR    . NECK SURGERY      No family history on file.  Social History   Social History  . Marital status: Single    Spouse name: N/A  . Number of children: N/A  . Years of education: N/A   Occupational History  . Not on file.   Social History Main Topics  . Smoking status: Former Research scientist (life sciences)  . Smokeless tobacco: Never Used  . Alcohol use Not on file  . Drug use: Unknown  . Sexual activity: Not on file   Other Topics Concern  . Not on file   Social History Narrative  . No narrative on file     Current Outpatient Prescriptions:  .  escitalopram (LEXAPRO) 20 MG tablet, Take 1 tablet (20 mg total) by mouth at bedtime., Disp: 90 tablet, Rfl: 0 .  meloxicam (MOBIC) 15 MG tablet, Take 1 tablet (15 mg total) by mouth daily., Disp: 30 tablet, Rfl: 2  No Known Allergies   ROS  Please see history of present illness for complete discussion of ROS  Objective  Vitals:   02/15/16 1018  BP: 120/80  Pulse: 83    Resp: 18  Temp: 98.6 F (37 C)  TempSrc: Oral  SpO2: 98%  Weight: 200 lb 14.4 oz (91.1 kg)  Height: 6' 1"  (1.854 m)    Physical Exam  Constitutional: He is oriented to person, place, and time and well-developed, well-nourished, and in no distress.  HENT:  Head: Normocephalic and atraumatic.  Cardiovascular: Normal rate, regular rhythm and normal heart sounds.   No murmur heard. Pulmonary/Chest: Effort normal and breath sounds normal.  Musculoskeletal:       Right foot: There is tenderness. There is no swelling.       Feet:  Neurological: He is alert and oriented to person, place, and time.  Psychiatric: Mood, memory, affect and judgment normal.  Nursing note and vitals reviewed.      Assessment & Plan  1. Plantar fasciitis of right foot Advised to take meloxicam 15 mg daily with food, patient has appointment with podiatry - meloxicam (MOBIC) 15 MG tablet; Take 1 tablet (15 mg total) by mouth daily.  Dispense: 30 tablet; Refill: 2  2. Fatigue due to depression Stable and improving, follow up in 3 months - escitalopram (LEXAPRO) 20 MG tablet; Take 1 tablet (20 mg total) by mouth at bedtime.  Dispense: 90 tablet; Refill: 0  3. Chronic neck and back pain Patient has been taking meloxicam 15 mg twice a day as opposed to 15 mg daily as prescribed. He has chronic neck and back pain and states that taking 50 mg twice daily helps relieve both neck and back pain along with the foot pain. Patient had surgery on lumbar spine and cervical spine. We will start on hydrocodone 5 mg twice daily, advised to take medication only as prescribed and as needed. If it works, we'll refer to pain clinic. - HYDROcodone-acetaminophen (NORCO/VICODIN) 5-325 MG tablet; Take 1 tablet by mouth 2 (two) times daily as needed for moderate pain.  Dispense: 60 tablet; Refill: 0   Dayshaun Whobrey [facility] 02/15/2016 10:48 AM

## 2016-02-27 ENCOUNTER — Encounter: Payer: Self-pay | Admitting: Podiatry

## 2016-02-27 ENCOUNTER — Ambulatory Visit (INDEPENDENT_AMBULATORY_CARE_PROVIDER_SITE_OTHER): Payer: Self-pay | Admitting: Podiatry

## 2016-02-27 DIAGNOSIS — M722 Plantar fascial fibromatosis: Secondary | ICD-10-CM

## 2016-02-27 DIAGNOSIS — Z79899 Other long term (current) drug therapy: Secondary | ICD-10-CM

## 2016-02-27 DIAGNOSIS — L603 Nail dystrophy: Secondary | ICD-10-CM

## 2016-02-27 MED ORDER — MELOXICAM 15 MG PO TABS: 15.0000 mg | ORAL_TABLET | Freq: Every day | ORAL | 3 refills | Status: DC

## 2016-02-27 MED ORDER — METHYLPREDNISOLONE 4 MG PO TBPK: ORAL_TABLET | ORAL | 0 refills | Status: DC

## 2016-02-27 NOTE — Progress Notes (Signed)
He presents today as a new patient with a chief complaint of painful right heel times past 6 months. 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 He is also concerned about discoloration of his toenails.  Objective: I have reviewed his past medical history medications allergies surgery social history. Vital signs are stable he is alert and oriented 3. Pulses are strongly palpable. Neurologic sensorium is intact persons once the monofilament deep tendon reflex are intact muscle strength is intact bilateral. He has pain on palpation may continue to go the right heel. Radiographs taken by his primary care provider were reviewed today which do demonstrate plantar distally oriented calcaneal heel spur the soft tissue increase in density of the plantar fascia calcaneal insertion site. He also has some osteoarthritic changes about the first metatarsophalangeal joint. Cutaneous evaluation demonstrates supple hydrated cutis thickening of the hallux nail right possibly secondary to trauma. No open wounds or lesions.  Assessment: Plantar fascitis right nail dystrophy hallux right.  Plan: Discussed etiology pathology conservative versus surgical therapies were discussed for shoe gear stretching exercises ice therapy and shoe modifications. Stretching exercises will be provided to him for plantar fasciitis. I also wrote a prescription for methylprednisolone to be followed by meloxicam. I injected the bilateral heels right heel with Kenalog and local anesthetic and dispensed a plantar fascia brace to be followed by a night splint. Follow up with him in 1 month.

## 2016-02-27 NOTE — Patient Instructions (Signed)

## 2016-03-31 ENCOUNTER — Encounter: Payer: Self-pay | Admitting: Podiatry

## 2016-03-31 ENCOUNTER — Ambulatory Visit (INDEPENDENT_AMBULATORY_CARE_PROVIDER_SITE_OTHER): Payer: Self-pay | Admitting: Podiatry

## 2016-03-31 DIAGNOSIS — B351 Tinea unguium: Secondary | ICD-10-CM

## 2016-03-31 DIAGNOSIS — M79676 Pain in unspecified toe(s): Secondary | ICD-10-CM

## 2016-03-31 DIAGNOSIS — M722 Plantar fascial fibromatosis: Secondary | ICD-10-CM

## 2016-03-31 MED ORDER — TERBINAFINE HCL 250 MG PO TABS: 250.0000 mg | ORAL_TABLET | Freq: Every day | ORAL | 0 refills | Status: DC

## 2016-03-31 NOTE — Progress Notes (Signed)
He presents today for follow-up of his plantar fasciitis and states that he might be 20% better. He states that he was doing much better until just recently where he started to worsen once again. He states that he has not been overly active and he continues his meloxicam. He would like to know the outcome of the pathology.  Objective: Vital signs are stable he is alert and oriented 3. Fungal pathology results demonstrated onychomycosis to his nails. He still has pain on palpation may continue tubercle of the right heel palpable pulses and no calf pain.  Assessment: Pain and limb secondary to onychomycosis and plantar fasciitis right foot.  Plan: After reviewing his most recent labs from November 2017 his liver profile was normal. I started him on Lamisil 250 mg tablets 1 by mouth daily #30 and will follow-up with me in 1 month. We discussed the pros and cons of oral therapy. We also scanned him today for several orthotics and I will follow-up with him once those have arrived.

## 2016-04-28 ENCOUNTER — Encounter: Payer: Self-pay | Admitting: Podiatry

## 2016-04-28 ENCOUNTER — Ambulatory Visit (INDEPENDENT_AMBULATORY_CARE_PROVIDER_SITE_OTHER): Payer: Self-pay | Admitting: Podiatry

## 2016-04-28 DIAGNOSIS — M722 Plantar fascial fibromatosis: Secondary | ICD-10-CM

## 2016-04-28 DIAGNOSIS — Z79899 Other long term (current) drug therapy: Secondary | ICD-10-CM

## 2016-04-28 DIAGNOSIS — L603 Nail dystrophy: Secondary | ICD-10-CM

## 2016-04-28 MED ORDER — TERBINAFINE HCL 250 MG PO TABS
250.0000 mg | ORAL_TABLET | Freq: Every day | ORAL | 0 refills | Status: DC
Start: 2016-04-28 — End: 2017-06-29

## 2016-04-28 NOTE — Progress Notes (Signed)
He presents today for follow-up of his plantar fasciitis. He states that both are feeling very painful at this point. He is also here to pick up his orthotics and be evaluated for nail fungus.  Objective: Vital signs are stable he is alert and oriented 3 denies any problems with the medication for either the foot or the nails. He states he is doing just fine.  Vascular evaluation pulses are intact Extensor was intact deep tendon reflexes are intact he still has pain on palpation medially continue tubercle right heel as well as the left. Toenails appear to be unchanged.  Assessment: Plantar fasciitis bilateral right greater than left. Onychomycosis bilateral.  Plan: Started him on his 90 days of Lamisil today. Blood work was requested. I also dispensed his orthotics today and he was given both oral and written instructions. I also injected the bilateral heels today with Kenalog and local anesthetic. Follow-up with him in 4 months for the nails and 6 weeks with a plantar fasciitis and orthotics.

## 2016-04-30 ENCOUNTER — Telehealth: Payer: Self-pay | Admitting: *Deleted

## 2016-04-30 LAB — HEPATIC FUNCTION PANEL
ALBUMIN: 4.4 g/dL (ref 3.5–5.5)
ALK PHOS: 84 IU/L (ref 39–117)
ALT: 27 IU/L (ref 0–44)
AST: 16 IU/L (ref 0–40)
BILIRUBIN, DIRECT: 0.19 mg/dL (ref 0.00–0.40)
Bilirubin Total: 0.9 mg/dL (ref 0.0–1.2)
TOTAL PROTEIN: 7.2 g/dL (ref 6.0–8.5)

## 2016-04-30 NOTE — Telephone Encounter (Addendum)
-----   Message from Garrel Ridgel, [place] sent at 04/30/2016  7:32 AM EST ----- Blood work looks good and may continue with mediation. Left message informing pt of Dr. Stephenie Acres orders.

## 2016-05-16 ENCOUNTER — Ambulatory Visit: Payer: Self-pay | Admitting: Family Medicine

## 2016-06-09 ENCOUNTER — Encounter: Payer: Self-pay | Admitting: Podiatry

## 2016-06-09 NOTE — Progress Notes (Signed)
This encounter was created in error - please disregard.

## 2016-08-13 ENCOUNTER — Ambulatory Visit: Payer: Self-pay | Admitting: Podiatry

## 2016-10-01 ENCOUNTER — Ambulatory Visit (INDEPENDENT_AMBULATORY_CARE_PROVIDER_SITE_OTHER): Payer: Self-pay | Admitting: Family Medicine

## 2016-10-01 ENCOUNTER — Encounter: Payer: Self-pay | Admitting: Family Medicine

## 2016-10-01 VITALS — BP 120/68 | HR 96 | Temp 98.0°F | Resp 17 | Ht 73.0 in | Wt 200.6 lb

## 2016-10-01 DIAGNOSIS — R0982 Postnasal drip: Secondary | ICD-10-CM

## 2016-10-01 DIAGNOSIS — M62830 Muscle spasm of back: Secondary | ICD-10-CM

## 2016-10-01 MED ORDER — MOMETASONE FUROATE 50 MCG/ACT NA SUSP: 2.0000 | Freq: Every day | NASAL | 0 refills | Status: DC

## 2016-10-01 MED ORDER — BENZONATATE 100 MG PO CAPS: 100.0000 mg | ORAL_CAPSULE | Freq: Three times a day (TID) | ORAL | 0 refills | Status: AC | PRN

## 2016-10-01 MED ORDER — METAXALONE 800 MG PO TABS: 800.0000 mg | ORAL_TABLET | Freq: Three times a day (TID) | ORAL | 0 refills | Status: AC

## 2016-10-01 NOTE — Progress Notes (Signed)
Name: Ronald Ellis   MRN: [id number]    DOB: 10/10/1960   Date:10/01/2016       Progress Note  Subjective  Chief Complaint  Chief Complaint  Patient presents with  . Back Pain  . Cough    Back Pain  This is a recurrent problem. The current episode started in the past 7 days (woke up last week in 'total pain' in his back, since then trying to stretch, ice, and position changes to help relieve the pain.). The pain is present in the lumbar spine. The quality of the pain is described as aching. The pain is at a severity of 7/10. The symptoms are aggravated by position (walking, moving going upstairs makes it worse.). Stiffness is present in the morning. Pertinent negatives include no fever. He has tried analgesics, bed rest, heat and ice for the symptoms.  Cough  This is a recurrent problem. The current episode started in the past 7 days. The cough is productive of sputum (white colored mucus). Associated symptoms include nasal congestion. Pertinent negatives include no chills, ear pain (feels that left ear is clogged up) or fever.      Past Medical History:  Diagnosis Date  . COPD (chronic obstructive pulmonary disease) ([place])     Past Surgical History:  Procedure Laterality Date  . BACK SURGERY    . HERNIA REPAIR    . NECK SURGERY      History reviewed. No pertinent family history.  Social History   Social History  . Marital status: Single    Spouse name: N/A  . Number of children: N/A  . Years of education: N/A   Occupational History  . Not on file.   Social History Main Topics  . Smoking status: Former Research scientist (life sciences)  . Smokeless tobacco: Never Used  . Alcohol use Not on file  . Drug use: Unknown  . Sexual activity: Not on file   Other Topics Concern  . Not on file   Social History Narrative  . No narrative on file     Current Outpatient Prescriptions:  .  escitalopram (LEXAPRO) 20 MG tablet, Take 1 tablet (20 mg total) by mouth at bedtime., Disp: 90 tablet, Rfl:  0 .  meloxicam (MOBIC) 15 MG tablet, Take 1 tablet (15 mg total) by mouth daily., Disp: 30 tablet, Rfl: 3 .  terbinafine (LAMISIL) 250 MG tablet, Take 1 tablet (250 mg total) by mouth daily. (Patient not taking: Reported on 10/01/2016), Disp: 90 tablet, Rfl: 0  No Known Allergies   Review of Systems  Constitutional: Negative for chills and fever.  HENT: Negative for ear pain (feels that left ear is clogged up).   Respiratory: Positive for cough.   Musculoskeletal: Positive for back pain.     Objective  Vitals:   10/01/16 1116  BP: 120/68  Pulse: 96  Resp: 17  Temp: 98 F (36.7 C)  TempSrc: Oral  SpO2: 98%  Weight: 200 lb 9.6 oz (91 kg)  Height: 6' 1"  (1.854 m)    Physical Exam  Constitutional: He is oriented to person, place, and time and well-developed, well-nourished, and in no distress.  HENT:  Head: Normocephalic and atraumatic.  Right Ear: Tympanic membrane and ear canal normal. No drainage or swelling.  Left Ear: Tympanic membrane and ear canal normal. No drainage or swelling.  Nose: Right sinus exhibits maxillary sinus tenderness. Left sinus exhibits maxillary sinus tenderness.  Mouth/Throat: Posterior oropharyngeal erythema present.  Cardiovascular: Normal rate, regular rhythm, S1 normal, S2 normal  and normal heart sounds.   No murmur heard. Pulmonary/Chest: Effort normal and breath sounds normal. He has no wheezes. He has no rhonchi.  Musculoskeletal:       Lumbar back: He exhibits tenderness, pain and spasm.       Back:  Neurological: He is alert and oriented to person, place, and time.  Psychiatric: Mood, memory, affect and judgment normal.  Nursing note and vitals reviewed.     Assessment & Plan  1. Spasm of muscle of lower back History of L4-5 fusion, likely associated muscle spasm and arthritis, starting Skelaxin - metaxalone (SKELAXIN) 800 MG tablet; Take 1 tablet (800 mg total) by mouth 3 (three) times daily.  Dispense: 21 tablet; Refill: 0  2.  Post-nasal drainage Antibiotic not indicated at this time, start on Tessalon and Nasonex spray - benzonatate (TESSALON) 100 MG capsule; Take 1 capsule (100 mg total) by mouth 3 (three) times daily as needed for cough.  Dispense: 20 capsule; Refill: 0 - mometasone (NASONEX) 50 MCG/ACT nasal spray; Place 2 sprays into the nose daily.  Dispense: 17 g; Refill: 0   [name] [facility] 10/01/2016 11:24 AM

## 2017-01-02 ENCOUNTER — Ambulatory Visit: Payer: Self-pay | Admitting: Family Medicine

## 2017-06-29 ENCOUNTER — Ambulatory Visit
Admission: RE | Admit: 2017-06-29 | Discharge: 2017-06-29 | Disposition: A | Discharge status: Home / Self Care | Payer: Medicare Other | Source: Ambulatory Visit | Attending: Family Medicine | Admitting: Family Medicine

## 2017-06-29 ENCOUNTER — Ambulatory Visit (INDEPENDENT_AMBULATORY_CARE_PROVIDER_SITE_OTHER): Payer: Medicare Other | Admitting: Family Medicine

## 2017-06-29 ENCOUNTER — Encounter: Payer: Self-pay | Admitting: Family Medicine

## 2017-06-29 VITALS — BP 110/82 | HR 71 | Temp 98.6°F | Resp 16 | Ht 73.0 in | Wt 198.3 lb

## 2017-06-29 DIAGNOSIS — R0982 Postnasal drip: Secondary | ICD-10-CM

## 2017-06-29 DIAGNOSIS — M542 Cervicalgia: Secondary | ICD-10-CM | POA: Diagnosis not present

## 2017-06-29 DIAGNOSIS — F338 Other recurrent depressive disorders: Secondary | ICD-10-CM | POA: Diagnosis not present

## 2017-06-29 DIAGNOSIS — J441 Chronic obstructive pulmonary disease with (acute) exacerbation: Secondary | ICD-10-CM

## 2017-06-29 DIAGNOSIS — R05 Cough: Secondary | ICD-10-CM | POA: Insufficient documentation

## 2017-06-29 DIAGNOSIS — R61 Generalized hyperhidrosis: Secondary | ICD-10-CM

## 2017-06-29 DIAGNOSIS — M62830 Muscle spasm of back: Secondary | ICD-10-CM | POA: Diagnosis not present

## 2017-06-29 DIAGNOSIS — Z114 Encounter for screening for human immunodeficiency virus [HIV]: Secondary | ICD-10-CM | POA: Diagnosis not present

## 2017-06-29 DIAGNOSIS — R718 Other abnormality of red blood cells: Secondary | ICD-10-CM

## 2017-06-29 DIAGNOSIS — R5383 Other fatigue: Secondary | ICD-10-CM

## 2017-06-29 DIAGNOSIS — E7800 Pure hypercholesterolemia, unspecified: Secondary | ICD-10-CM | POA: Diagnosis not present

## 2017-06-29 DIAGNOSIS — R059 Cough, unspecified: Secondary | ICD-10-CM

## 2017-06-29 DIAGNOSIS — Z23 Encounter for immunization: Secondary | ICD-10-CM | POA: Diagnosis not present

## 2017-06-29 DIAGNOSIS — Z79899 Other long term (current) drug therapy: Secondary | ICD-10-CM

## 2017-06-29 DIAGNOSIS — J3089 Other allergic rhinitis: Secondary | ICD-10-CM | POA: Diagnosis not present

## 2017-06-29 DIAGNOSIS — Z1159 Encounter for screening for other viral diseases: Secondary | ICD-10-CM

## 2017-06-29 DIAGNOSIS — Z113 Encounter for screening for infections with a predominantly sexual mode of transmission: Secondary | ICD-10-CM

## 2017-06-29 DIAGNOSIS — F1921 Other psychoactive substance dependence, in remission: Secondary | ICD-10-CM

## 2017-06-29 DIAGNOSIS — M25562 Pain in left knee: Secondary | ICD-10-CM | POA: Diagnosis not present

## 2017-06-29 DIAGNOSIS — M549 Dorsalgia, unspecified: Secondary | ICD-10-CM

## 2017-06-29 DIAGNOSIS — R739 Hyperglycemia, unspecified: Secondary | ICD-10-CM

## 2017-06-29 DIAGNOSIS — Z5320 Procedure and treatment not carried out because of patient's decision for unspecified reasons: Secondary | ICD-10-CM

## 2017-06-29 DIAGNOSIS — J449 Chronic obstructive pulmonary disease, unspecified: Secondary | ICD-10-CM | POA: Insufficient documentation

## 2017-06-29 DIAGNOSIS — G8929 Other chronic pain: Secondary | ICD-10-CM

## 2017-06-29 LAB — CBC WITH DIFFERENTIAL/PLATELET
BASOS ABS: 62 {cells}/uL (ref 0–200)
Basophils Relative: 0.7 %
EOS ABS: 123 {cells}/uL (ref 15–500)
Eosinophils Relative: 1.4 %
HEMATOCRIT: 47.1 % (ref 38.5–50.0)
HEMOGLOBIN: 15.9 g/dL (ref 13.2–17.1)
Lymphs Abs: 1848 cells/uL (ref 850–3900)
MCH: 32 pg (ref 27.0–33.0)
MCHC: 33.8 g/dL (ref 32.0–36.0)
MCV: 94.8 fL (ref 80.0–100.0)
MPV: 10.4 fL (ref 7.5–12.5)
Monocytes Relative: 7.2 %
NEUTROS ABS: 6134 {cells}/uL (ref 1500–7800)
Neutrophils Relative %: 69.7 %
Platelets: 437 10*3/uL — ABNORMAL HIGH (ref 140–400)
RBC: 4.97 10*6/uL (ref 4.20–5.80)
RDW: 12.1 % (ref 11.0–15.0)
Total Lymphocyte: 21 %
WBC mixed population: 634 cells/uL (ref 200–950)
WBC: 8.8 10*3/uL (ref 3.8–10.8)

## 2017-06-29 LAB — LIPID PANEL
CHOL/HDL RATIO: 5.4 (calc) — AB (ref <5.0)
Cholesterol: 190 mg/dL (ref <200)
HDL: 35 mg/dL — ABNORMAL LOW (ref >40)
LDL Cholesterol (Calc): 136 mg/dL (calc) — ABNORMAL HIGH
NON-HDL CHOLESTEROL (CALC): 155 mg/dL — AB (ref <130)
Triglycerides: 87 mg/dL (ref <150)

## 2017-06-29 LAB — COMPLETE METABOLIC PANEL WITH GFR
AG RATIO: 1.4 (calc) (ref 1.0–2.5)
ALT: 26 U/L (ref 9–46)
AST: 21 U/L (ref 10–35)
Albumin: 4.5 g/dL (ref 3.6–5.1)
Alkaline phosphatase (APISO): 77 U/L (ref 40–115)
BUN: 9 mg/dL (ref 7–25)
CALCIUM: 9.8 mg/dL (ref 8.6–10.3)
CO2: 30 mmol/L (ref 20–32)
Chloride: 102 mmol/L (ref 98–110)
Creat: 1 mg/dL (ref 0.70–1.33)
GFR, EST AFRICAN AMERICAN: 98 mL/min/{1.73_m2} (ref >=60)
GFR, EST NON AFRICAN AMERICAN: 84 mL/min/{1.73_m2} (ref >=60)
GLOBULIN: 3.2 g/dL (ref 1.9–3.7)
Glucose, Bld: 81 mg/dL (ref 65–139)
POTASSIUM: 4.8 mmol/L (ref 3.5–5.3)
Sodium: 137 mmol/L (ref 135–146)
TOTAL PROTEIN: 7.7 g/dL (ref 6.1–8.1)
Total Bilirubin: 1 mg/dL (ref 0.2–1.2)

## 2017-06-29 MED ORDER — FLUTICASONE FUROATE-VILANTEROL 100-25 MCG/INH IN AEPB: 1.0000 | INHALATION_SPRAY | Freq: Every day | RESPIRATORY_TRACT | 0 refills | Status: DC

## 2017-06-29 MED ORDER — AMOXICILLIN-POT CLAVULANATE 875-125 MG PO TABS: 1.0000 | ORAL_TABLET | Freq: Two times a day (BID) | ORAL | 0 refills | Status: DC

## 2017-06-29 MED ORDER — TIZANIDINE HCL 4 MG PO TABS: 4.0000 mg | ORAL_TABLET | Freq: Every day | ORAL | 1 refills | Status: DC

## 2017-06-29 MED ORDER — MOMETASONE FUROATE 50 MCG/ACT NA SUSP: 2.0000 | Freq: Every day | NASAL | 2 refills | Status: DC

## 2017-06-29 MED ORDER — PREDNISONE 20 MG PO TABS: 20.0000 mg | ORAL_TABLET | Freq: Every day | ORAL | 0 refills | Status: DC

## 2017-06-29 MED ORDER — DULOXETINE HCL 30 MG PO CPEP: 30.0000 mg | ORAL_CAPSULE | Freq: Every day | ORAL | 0 refills | Status: DC

## 2017-06-29 MED ORDER — ALBUTEROL SULFATE HFA 108 (90 BASE) MCG/ACT IN AERS
2.0000 | INHALATION_SPRAY | Freq: Four times a day (QID) | RESPIRATORY_TRACT | 0 refills | Status: DC | PRN
Start: 2017-06-29 — End: 2019-08-30

## 2017-06-29 MED ORDER — MELOXICAM 15 MG PO TABS: 15.0000 mg | ORAL_TABLET | Freq: Every day | ORAL | 1 refills | Status: DC

## 2017-06-29 NOTE — Progress Notes (Signed)
Name: Ronald Ellis   MRN: [id number]    DOB: 1960/12/06   Date:06/29/2017       Progress Note  Subjective  Chief Complaint  Chief Complaint  Patient presents with  . URI    HPI  Chronic pain: he has a long history of neck and lower back pain, symptoms started after a work related injury in 2014. He did not have insurance, but finally under disability and is able to return for follow up. He states muscle relaxer seems to help with his ability to sleep by controlling muscle spasms. He has a long history of drug abuse and even though he used to take hydrocodone ( given by his previous PCP) we will not fill medication, and patient is okay with that. He states Mobic seems to help and he would like a refill of that. Pain is constant, causes decrease rom of neck and sometimes radiates to leg  COPD : he states he has noticed a productive cough, night sweats, some sob and wheezing over the past two weeks. He does not have risk factors for TB but is concerned about it also. He states he has decreased smoking but still smokes a couple of cigarettes daily.   Acute left knee pain: going on for the past 6 weeks, no effusion or erythema, no trauma but has been working his yard lately  Seasonal affective: took Lexapro in the past but did not work, doing better since sun is out, however still has fatigue daily. Discussed starting duloxetine to see if it will improve his chronic pain and he is willing to try it. Discussed possible side effects and to titrate medication over the first week.   Hyperglycemia: in the past, and no recent labs, we will recheck it  Dyslipidemia: told by previous pcp that his cholesterol was high, we will recheck it.  Fatigue: it may be multifactorial, we will check labs  Patient Active Problem List   Diagnosis Date Noted  . COPD (chronic obstructive pulmonary disease) ([place]) 06/29/2017  . Right-sided low back pain with right-sided sciatica 12/05/2015  . Plantar fasciitis of  right foot 10/15/2015  . Chronic neck and back pain 07/19/2015  . Fatigue 07/19/2015    Past Surgical History:  Procedure Laterality Date  . BACK SURGERY  2015  . INGUINAL HERNIA REPAIR Bilateral    multiple times  . lumbar discetomy  2005  . NECK SURGERY  2016    History reviewed. No pertinent family history.  Social History   Socioeconomic History  . Marital status: Soil scientist    Spouse name: [name]  . Number of children: 4  . Years of education: Not on file  . Highest education level: Some college, no degree  Occupational History  . 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 Financial resource strain: Not on file  . Food insecurity:    Worry: Not on file    Inability: Not on file  . Transportation needs:    Medical: Not on file    Non-medical: Not on file  Tobacco Use  . Smoking status: Former Smoker    Packs/day: 2.00    Years: 40.00    Pack years: 80.00    Types: Cigarettes    Last attempt to quit: 01/01/2017    Years since quitting: 0.4  . Smokeless tobacco: Never Used  Substance and Sexual Activity  . Alcohol use: Not Currently    Comment:  quit 5-6 years ago, only beer - but glutten free diet now  . Drug use: Yes    Types: Cocaine, Marijuana, Heroin, "Crack" cocaine, Other-see comments    Comment: meths in his 20's-30's  . Sexual activity: Yes    Partners: Female    Birth control/protection: Post-menopausal  Lifestyle  . Physical activity:    Days per week: Not on file    Minutes per session: Not on file  . Stress: Not on file  Relationships  . Social connections:    Talks on phone: Not on file    Gets together: Not on file    Attends religious service: Not on file    Active member of club or organization: Not on file    Attends meetings of clubs or organizations: Not on file    Relationship status: Not on file  . Intimate partner violence:    Fear of current or ex partner: Not on  file    Emotionally abused: Not on file    Physically abused: Not on file    Forced sexual activity: Not on file  Other Topics Concern  . Not on file  Social History Narrative   Approved for disability 06/2017 ( workman's comp back 2014) , secondary to neck and back injury      Current Outpatient Medications:  .  meloxicam (MOBIC) 15 MG tablet, Take 1 tablet (15 mg total) by mouth daily. Hold while on prednisone, Disp: 90 tablet, Rfl: 1 .  mometasone (NASONEX) 50 MCG/ACT nasal spray, Place 2 sprays into the nose daily., Disp: 17 g, Rfl: 2 .  amoxicillin-clavulanate (AUGMENTIN) 875-125 MG tablet, Take 1 tablet by mouth 2 (two) times daily., Disp: 20 tablet, Rfl: 0 .  DULoxetine (CYMBALTA) 30 MG capsule, Take 1-2 capsules (30-60 mg total) by mouth daily. First week take one and after that take 2 daily, Disp: 60 capsule, Rfl: 0 .  predniSONE (DELTASONE) 20 MG tablet, Take 1 tablet (20 mg total) by mouth daily with breakfast., Disp: 10 tablet, Rfl: 0 .  tiZANidine (ZANAFLEX) 4 MG tablet, Take 1 tablet (4 mg total) by mouth at bedtime., Disp: 90 tablet, Rfl: 1  No Known Allergies   ROS  Constitutional: Positive  for fever and weight change.  Respiratory: Positive  for cough and sometimes  shortness of breath.   Cardiovascular: Negative for chest pain or palpitations.  Gastrointestinal: Negative for abdominal pain, no bowel changes.  Musculoskeletal: Positive for gait problem but no  joint swelling.  Skin: Negative for rash.  Neurological: Negative for dizziness , positive for  Headache - from neck pain .  No other specific complaints in a complete review of systems (except as listed in HPI above).  Objective  Vitals:   06/29/17 1018  BP: 110/82  Pulse: 71  Resp: 16  Temp: 98.6 F (37 C)  TempSrc: Oral  SpO2: 99%  Weight: 198 lb 4.8 oz (89.9 kg)  Height: 6' 1"  (1.854 m)    Body mass index is 26.16 kg/m.  Physical Exam  Constitutional: Patient appears well-developed and  well-nourished.  No distress.  HEENT: head atraumatic, normocephalic, pupils equal and reactive to light, ears normal TM, neck supple, throat within normal limits Cardiovascular: Normal rate, regular rhythm and normal heart sounds.  No murmur heard. No BLE edema. Pulmonary/Chest: Effort normal mild rhonchi. No respiratory distress. Abdominal: Soft.  There is no tenderness. Psychiatric: Patient has a normal mood and affect. behavior is normal. Judgment and thought content normal. Muscular Skeletal:  pain with palpation of lumbar spine, decrease rom of neck and pain during palpation Knee crepitus extension of left knee without effusion    PHQ2/9: Depression screen [facility] 2/9 10/01/2016 01/16/2016 10/15/2015 07/19/2015  Decreased Interest 0 0 0 0  Down, Depressed, Hopeless 0 0 1 0  PHQ - 2 Score 0 0 1 0     Fall Risk: Fall Risk  06/29/2017 10/01/2016 01/16/2016 10/15/2015 07/19/2015  Falls in the past year? No No No No No    Functional Status Survey: Is the patient deaf or have difficulty hearing?: No Does the patient have difficulty seeing, even when wearing glasses/contacts?: No Does the patient have difficulty concentrating, remembering, or making decisions?: No Does the patient have difficulty walking or climbing stairs?: No Does the patient have difficulty dressing or bathing?: No Does the patient have difficulty doing errands alone such as visiting a doctor's office or shopping?: No   Assessment & Plan  1. COPD with acute exacerbation (HCC)  - predniSONE (DELTASONE) 20 MG tablet; Take 1 tablet (20 mg total) by mouth daily with breakfast.  Dispense: 10 tablet; Refill: 0 - amoxicillin-clavulanate (AUGMENTIN) 875-125 MG tablet; Take 1 tablet by mouth 2 (two) times daily.  Dispense: 20 tablet; Refill: 0 - DG Chest 2 View; Future - fluticasone furoate-vilanterol (BREO ELLIPTA) 100-25 MCG/INH AEPB; Inhale 1 puff into the lungs daily.  Dispense: 60 each; Refill: 0 - albuterol (PROVENTIL  HFA;VENTOLIN HFA) 108 (90 Base) MCG/ACT inhaler; Inhale 2 puffs into the lungs every 6 (six) hours as needed for wheezing or shortness of breath.  Dispense: 1 Inhaler; Refill: 0  2. Chronic neck and back pain  - meloxicam (MOBIC) 15 MG tablet; Take 1 tablet (15 mg total) by mouth daily. Hold while on prednisone  Dispense: 90 tablet; Refill: 1 - DULoxetine (CYMBALTA) 30 MG capsule; Take 1-2 capsules (30-60 mg total) by mouth daily. First week take one and after that take 2 daily  Dispense: 60 capsule; Refill: 0 - tiZANidine (ZANAFLEX) 4 MG tablet; Take 1 tablet (4 mg total) by mouth at bedtime.  Dispense: 90 tablet; Refill: 1  3. Seasonal affective disorder ([place])  Start duloxetine  4. Spasm of muscle of lower back  Start zanaflex  5. Acute pain of left knee  Advised to return if no improvement with prednisone and once finished start meloxicam, may need to see ortho  6. Post-nasal drainage  Resume nasal spray   7. Perennial allergic rhinitis  - mometasone (NASONEX) 50 MCG/ACT nasal spray; Place 2 sprays into the nose daily.  Dispense: 17 g; Refill: 2  8. Need for vaccination for pneumococcus  - Pneumococcal polysaccharide vaccine 23-valent greater than or equal to 2yo subcutaneous/IM  9. Need for hepatitis C screening test  - Hepatitis C Antibody  10. Night sweats  - COMPLETE METABOLIC PANEL WITH GFR - QuantiFERON-TB Gold Plus - amoxicillin-clavulanate (AUGMENTIN) 875-125 MG tablet; Take 1 tablet by mouth 2 (two) times daily.  Dispense: 20 tablet; Refill: 0 - DG Chest 2 View; Future  11. Cough  - QuantiFERON-TB Gold Plus - amoxicillin-clavulanate (AUGMENTIN) 875-125 MG tablet; Take 1 tablet by mouth 2 (two) times daily.  Dispense: 20 tablet; Refill: 0 - DG Chest 2 View; Future  12. Routine screening for STI (sexually transmitted infection)  - RPR  13. History of drug dependence/abuse ([place])   14. HIV screening declined  - HIV antibody  15. Long-term use of  high-risk medication  - COMPLETE METABOLIC PANEL WITH GFR  16. Abnormal red  blood cells  - CBC with Differential/Platelet  17. Hyperglycemia  - Hemoglobin A1c  18. Pure hypercholesterolemia  - Lipid panel  29. Other fatigue

## 2017-06-30 ENCOUNTER — Telehealth: Payer: Self-pay

## 2017-06-30 LAB — HEMOGLOBIN A1C
HEMOGLOBIN A1C: 5.6 %{Hb} (ref <5.7)
Mean Plasma Glucose: 114 (calc)
eAG (mmol/L): 6.3 (calc)

## 2017-06-30 LAB — QUANTIFERON-TB GOLD PLUS
Mitogen-NIL: >10 IU/mL
NIL: 0.03 [IU]/mL
QUANTIFERON-TB GOLD PLUS: NEGATIVE
TB1-NIL: <0 IU/mL

## 2017-06-30 LAB — HIV ANTIBODY (ROUTINE TESTING W REFLEX): HIV 1&2 Ab, 4th Generation: NONREACTIVE

## 2017-06-30 LAB — RPR: RPR: NONREACTIVE

## 2017-06-30 LAB — HEPATITIS C ANTIBODY
Hepatitis C Ab: NONREACTIVE
SIGNAL TO CUT-OFF: 0.04 (ref <1.00)

## 2017-06-30 NOTE — Telephone Encounter (Signed)
Called pt to sched AWV w/ [facility]. LVM requesting returned call.  

## 2017-08-03 ENCOUNTER — Ambulatory Visit: Payer: Medicare Other | Admitting: Family Medicine

## 2017-08-24 ENCOUNTER — Other Ambulatory Visit: Payer: Self-pay | Admitting: Family Medicine

## 2017-08-24 DIAGNOSIS — M542 Cervicalgia: Secondary | ICD-10-CM

## 2017-08-24 DIAGNOSIS — M549 Dorsalgia, unspecified: Principal | ICD-10-CM

## 2017-08-24 DIAGNOSIS — G8929 Other chronic pain: Principal | ICD-10-CM

## 2017-08-24 MED ORDER — DULOXETINE HCL 60 MG PO CPEP: 60.0000 mg | ORAL_CAPSULE | Freq: Every day | ORAL | 0 refills | Status: DC

## 2017-10-12 ENCOUNTER — Ambulatory Visit (INDEPENDENT_AMBULATORY_CARE_PROVIDER_SITE_OTHER): Payer: Medicare Other | Admitting: Family Medicine

## 2017-10-12 ENCOUNTER — Encounter: Payer: Self-pay | Admitting: Family Medicine

## 2017-10-12 VITALS — BP 110/62 | HR 83 | Temp 97.9°F | Resp 16 | Ht 73.0 in | Wt 196.6 lb

## 2017-10-12 DIAGNOSIS — J449 Chronic obstructive pulmonary disease, unspecified: Secondary | ICD-10-CM | POA: Diagnosis not present

## 2017-10-12 DIAGNOSIS — M549 Dorsalgia, unspecified: Secondary | ICD-10-CM

## 2017-10-12 DIAGNOSIS — M542 Cervicalgia: Secondary | ICD-10-CM | POA: Diagnosis not present

## 2017-10-12 DIAGNOSIS — E7800 Pure hypercholesterolemia, unspecified: Secondary | ICD-10-CM | POA: Diagnosis not present

## 2017-10-12 DIAGNOSIS — J3089 Other allergic rhinitis: Secondary | ICD-10-CM | POA: Diagnosis not present

## 2017-10-12 DIAGNOSIS — R0683 Snoring: Secondary | ICD-10-CM | POA: Diagnosis not present

## 2017-10-12 DIAGNOSIS — G8929 Other chronic pain: Secondary | ICD-10-CM | POA: Diagnosis not present

## 2017-10-12 DIAGNOSIS — G4700 Insomnia, unspecified: Secondary | ICD-10-CM | POA: Diagnosis not present

## 2017-10-12 DIAGNOSIS — F320 Major depressive disorder, single episode, mild: Secondary | ICD-10-CM

## 2017-10-12 DIAGNOSIS — R739 Hyperglycemia, unspecified: Secondary | ICD-10-CM

## 2017-10-12 MED ORDER — DULOXETINE HCL 60 MG PO CPEP: 60.0000 mg | ORAL_CAPSULE | Freq: Every day | ORAL | 0 refills | Status: DC

## 2017-10-12 MED ORDER — TRAZODONE HCL 50 MG PO TABS: 25.0000 mg | ORAL_TABLET | Freq: Every evening | ORAL | 0 refills | Status: DC | PRN

## 2017-10-12 MED ORDER — FLUTICASONE FUROATE-VILANTEROL 100-25 MCG/INH IN AEPB: 1.0000 | INHALATION_SPRAY | Freq: Every day | RESPIRATORY_TRACT | 5 refills | Status: DC

## 2017-10-12 MED ORDER — NAPROXEN 500 MG PO TABS: 500.0000 mg | ORAL_TABLET | Freq: Two times a day (BID) | ORAL | 0 refills | Status: DC

## 2017-10-12 NOTE — Progress Notes (Signed)
Name: Ronald Ellis   MRN: [id number]    DOB: 11-22-1960   Date:10/12/2017       Progress Note  Subjective  Chief Complaint  Chief Complaint  Patient presents with  . Medication Refill  . Pain  . COPD  . Dyslipidemia  . Seasonal Affective    HPI  Chronic pain: he has a long history of neck and lower back pain, symptoms started after a work related injury in 2014. He did not have insurance, but finally under disability 2019  He states muscle relaxer seems to helps with muscle spasms He has a long history of drug abuse and even though he used to take hydrocodone ( given by his previous PCP) we will not fill medication, and patient is okay with that, he states he saw his step father go through opioid addiction and does not want to follow that path. He states Mobic but taking 30 mg daily. Explained not indicated at that dose, we will switch to naproxen. Explained to only take medication as prescribed . He is also on Duloxetine and he states it seems to help. Pain is averaging 4-5/10 , pain can go up to 8-9/10   COPD : he states since the COPD flare back in April he is doing better, but still had a daily cough, that is productive yellowish to white. He only has SOB when active in the heat. He has intermittent wheezing. He is not using Breo daily, explained that it may help with symptoms. Last CXR was normal. He is only smoking cigars about once a month  Left knee pain: had an injury March , and states pain is not as intense, he states aggravated by using steps but not constant, no effusion or redness.   Major Depression: took Lexapro in the past but did not work, used to be seasonal affective disorder before back injury, however his life changed significantly. He used to be very active and adventures ( used to scuba dive/rock climbing, riding dirt bikes...). He raised his children and grandchildren outdoors and now he cannot do any of that. He started on Duloxetine 60 mg and states it helps with  pain and mood. PHq9 is still high but he states that if he can improve his sleep he will feel better, we will try trazodone   Hyperglycemia: last hgbA1C was normal, no polyphagia, polyuria or polydipsia  Thrombocytosis: on his last labs during COPD flare  Dyslipidemia: he has a very low HDL, LDL not very high, no significant family history of heart disease. He eats well but not able to be as active.   Snoring: loud , also has pauses during sleep, advised sleep study, he states he will go if covered by insurance , he states he would never wear a CPAP so we will hold off on referral for now   Patient Active Problem List   Diagnosis Date Noted  . COPD (chronic obstructive pulmonary disease) ([place]) 06/29/2017  . Right-sided low back pain with right-sided sciatica 12/05/2015  . Plantar fasciitis of right foot 10/15/2015  . Chronic neck and back pain 07/19/2015  . Fatigue 07/19/2015    Past Surgical History:  Procedure Laterality Date  . BACK SURGERY  2015  . INGUINAL HERNIA REPAIR Bilateral    multiple times  . lumbar discetomy  2005  . NECK SURGERY  2016    History reviewed. No pertinent family history.  Social History   Socioeconomic History  . Marital status: Soil scientist    Spouse  name: [name]  . Number of children: 4  . Years of education: Not on file  . Highest education level: Some college, no degree  Occupational History  . 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 Financial resource strain: Not hard at all  . Food insecurity:    Worry: Never true    Inability: Never true  . Transportation needs:    Medical: No    Non-medical: No  Tobacco Use  . Smoking status: Former Smoker    Packs/day: 2.00    Years: 40.00    Pack years: 80.00    Types: Cigarettes    Last attempt to quit: 01/02/2012    Years since quitting: 5.7  . Smokeless tobacco: Never Used  . Tobacco comment: currently just a couple  cigars once a month   Substance and Sexual Activity  . Alcohol use: Not Currently    Comment: quit 5-6 years ago, only beer - but glutten free diet now  . Drug use: Yes    Types: Cocaine, Marijuana, Heroin, "Crack" cocaine, Other-see comments    Comment: meths in his 20's-30's  . Sexual activity: Yes    Partners: Female    Birth control/protection: Post-menopausal  Lifestyle  . Physical activity:    Days per week: 0 days    Minutes per session: 0 min  . Stress: Not at all  Relationships  . Social connections:    Talks on phone: More than three times a week    Gets together: Never    Attends religious service: Never    Active member of club or organization: No    Attends meetings of clubs or organizations: Never    Relationship status: Living with partner  . Intimate partner violence:    Fear of current or ex partner: No    Emotionally abused: No    Physically abused: No    Forced sexual activity: No  Other Topics Concern  . Not on file  Social History Narrative   Approved for disability 06/2017 ( workman's comp back 2014) , secondary to neck and back injury      Current Outpatient Medications:  .  acetaminophen (TYLENOL) 500 MG tablet, Take 500 mg by mouth every 6 (six) hours as needed., Disp: , Rfl:  .  albuterol (PROVENTIL HFA;VENTOLIN HFA) 108 (90 Base) MCG/ACT inhaler, Inhale 2 puffs into the lungs every 6 (six) hours as needed for wheezing or shortness of breath., Disp: 1 Inhaler, Rfl: 0 .  Ascorbic Acid (VITAMIN C PO), Take by mouth., Disp: , Rfl:  .  b complex vitamins tablet, Take 1 tablet by mouth daily., Disp: , Rfl:  .  BIOTIN PO, Take by mouth., Disp: , Rfl:  .  DULoxetine (CYMBALTA) 60 MG capsule, Take 1 capsule (60 mg total) by mouth daily., Disp: 90 capsule, Rfl: 0 .  fluticasone furoate-vilanterol (BREO ELLIPTA) 100-25 MCG/INH AEPB, Inhale 1 puff into the lungs daily., Disp: 60 each, Rfl: 0 .  MAGNESIUM PO, Take by mouth., Disp: , Rfl:  .  mometasone  (NASONEX) 50 MCG/ACT nasal spray, Place 2 sprays into the nose daily., Disp: 17 g, Rfl: 2 .  tiZANidine (ZANAFLEX) 4 MG tablet, Take 1 tablet (4 mg total) by mouth at bedtime., Disp: 90 tablet, Rfl: 1  Allergies  Allergen Reactions  . Gluten Meal      ROS  Constitutional: Negative for fever or weight change.  Respiratory: Positive  for cough and shortness of breath.   Cardiovascular: Negative for chest pain or palpitations.  Gastrointestinal: Negative for abdominal pain, no bowel changes.  Musculoskeletal: Negative for gait problem or joint swelling.  Skin: Negative for rash.   Neurological: Negative for dizziness or headache.  No other specific complaints in a complete review of systems (except as listed in HPI above).  Objective  Vitals:   10/12/17 1044  BP: 110/62  Pulse: 83  Resp: 16  Temp: 97.9 F (36.6 C)  TempSrc: Oral  SpO2: 97%  Weight: 196 lb 9.6 oz (89.2 kg)  Height: 6' 1"  (1.854 m)    Body mass index is 25.94 kg/m.  Physical Exam  Constitutional: Patient appears well-developed and well-nourished. Overweight.  No distress.  HEENT: head atraumatic, normocephalic, pupils equal and reactive to light,  neck supple, throat within normal limits Cardiovascular: Normal rate, regular rhythm and normal heart sounds.  No murmur heard. No BLE edema. Pulmonary/Chest: Effort normal , productive cough, scattered rhonchi but worse on right base. No respiratory distress. Abdominal: Soft.  There is no tenderness. Psychiatric: Patient has a normal mood and affect. behavior is normal. Judgment and thought content normal. Muscular Skeletal: pain during palpation of right lower back, positive straight leg raise of right side  PHQ2/9: Depression screen [facility] 2/9 10/12/2017 10/01/2016 01/16/2016 10/15/2015 07/19/2015  Decreased Interest 1 0 0 0 0  Down, Depressed, Hopeless 1 0 0 1 0  PHQ - 2 Score 2 0 0 1 0  Altered sleeping 3 - - - -  Tired, decreased energy 1 - - - -  Change in  appetite 1 - - - -  Feeling bad or failure about yourself  1 - - - -  Trouble concentrating 1 - - - -  Moving slowly or fidgety/restless 1 - - - -  Suicidal thoughts 0 - - - -  PHQ-9 Score 10 - - - -  Difficult doing work/chores Not difficult at all - - - -    Fall Risk: Fall Risk  10/12/2017 06/29/2017 10/01/2016 01/16/2016 10/15/2015  Falls in the past year? No No No No No     Functional Status Survey: Is the patient deaf or have difficulty hearing?: No Does the patient have difficulty seeing, even when wearing glasses/contacts?: No Does the patient have difficulty concentrating, remembering, or making decisions?: No Does the patient have difficulty walking or climbing stairs?: Yes(patient has to take it slow and must use his left leg first) Does the patient have difficulty dressing or bathing?: No Does the patient have difficulty doing errands alone such as visiting a doctor's office or shopping?: No    Assessment & Plan  1. Chronic neck and back pain  - DULoxetine (CYMBALTA) 60 MG capsule; Take 1 capsule (60 mg total) by mouth daily.  Dispense: 90 capsule; Refill: 0  2. Mild major depression ([place])  phq 9 is still high we will try correct insomnia first  3. Chronic obstructive pulmonary disease, unspecified COPD type (HCC)  - naproxen (NAPROSYN) 500 MG tablet; Take 1 tablet (500 mg total) by mouth 2 (two) times daily with a meal.  Dispense: 180 tablet; Refill: 0 - fluticasone furoate-vilanterol (BREO ELLIPTA) 100-25 MCG/INH AEPB; Inhale 1 puff into the lungs daily.  Dispense: 60 each; Refill: 5  4. Perennial allergic rhinitis  stable  5. Dyslipidemia  He will try to increase physical activity   6. Hyperglycemia  On life style modification   7. Insomnia, unspecified type  - traZODone (  DESYREL) 50 MG tablet; Take 0.5-1 tablets (25-50 mg total) by mouth at bedtime as needed for sleep.  Dispense: 30 tablet; Refill: 0

## 2018-01-23 ENCOUNTER — Other Ambulatory Visit: Payer: Self-pay | Admitting: Family Medicine

## 2018-01-23 DIAGNOSIS — R61 Generalized hyperhidrosis: Secondary | ICD-10-CM

## 2018-01-23 DIAGNOSIS — M549 Dorsalgia, unspecified: Secondary | ICD-10-CM

## 2018-01-23 DIAGNOSIS — R05 Cough: Secondary | ICD-10-CM

## 2018-01-23 DIAGNOSIS — R059 Cough, unspecified: Secondary | ICD-10-CM

## 2018-01-23 DIAGNOSIS — G4700 Insomnia, unspecified: Secondary | ICD-10-CM

## 2018-01-23 DIAGNOSIS — J441 Chronic obstructive pulmonary disease with (acute) exacerbation: Secondary | ICD-10-CM

## 2018-01-23 DIAGNOSIS — M542 Cervicalgia: Secondary | ICD-10-CM

## 2018-01-23 DIAGNOSIS — G8929 Other chronic pain: Secondary | ICD-10-CM

## 2018-02-05 ENCOUNTER — Encounter: Payer: Self-pay | Admitting: Family Medicine

## 2018-02-05 ENCOUNTER — Telehealth: Payer: Self-pay | Admitting: Family Medicine

## 2018-02-05 ENCOUNTER — Ambulatory Visit (INDEPENDENT_AMBULATORY_CARE_PROVIDER_SITE_OTHER): Payer: Medicare Other | Admitting: Family Medicine

## 2018-02-05 VITALS — BP 132/84 | HR 97 | Temp 98.7°F | Resp 18 | Ht 73.0 in | Wt 198.8 lb

## 2018-02-05 DIAGNOSIS — K900 Celiac disease: Secondary | ICD-10-CM | POA: Diagnosis not present

## 2018-02-05 DIAGNOSIS — J441 Chronic obstructive pulmonary disease with (acute) exacerbation: Secondary | ICD-10-CM | POA: Diagnosis not present

## 2018-02-05 DIAGNOSIS — R351 Nocturia: Secondary | ICD-10-CM | POA: Diagnosis not present

## 2018-02-05 DIAGNOSIS — J3089 Other allergic rhinitis: Secondary | ICD-10-CM | POA: Diagnosis not present

## 2018-02-05 DIAGNOSIS — Z1211 Encounter for screening for malignant neoplasm of colon: Secondary | ICD-10-CM | POA: Diagnosis not present

## 2018-02-05 DIAGNOSIS — Z1212 Encounter for screening for malignant neoplasm of rectum: Secondary | ICD-10-CM | POA: Diagnosis not present

## 2018-02-05 LAB — HEMOCCULT GUIAC POC 1CARD (OFFICE): Fecal Occult Blood, POC: NEGATIVE

## 2018-02-05 MED ORDER — BENZONATATE 100 MG PO CAPS: 100.0000 mg | ORAL_CAPSULE | Freq: Three times a day (TID) | ORAL | 0 refills | Status: DC | PRN

## 2018-02-05 MED ORDER — BUDESONIDE-FORMOTEROL FUMARATE 160-4.5 MCG/ACT IN AERO: 2.0000 | INHALATION_SPRAY | Freq: Two times a day (BID) | RESPIRATORY_TRACT | 3 refills | Status: DC

## 2018-02-05 MED ORDER — MOMETASONE FUROATE 50 MCG/ACT NA SUSP: 2.0000 | Freq: Every day | NASAL | 2 refills | Status: DC

## 2018-02-05 MED ORDER — AZITHROMYCIN 250 MG PO TABS: ORAL_TABLET | ORAL | 0 refills | Status: DC

## 2018-02-05 MED ORDER — PREDNISONE 10 MG PO TABS: ORAL_TABLET | ORAL | 0 refills | Status: AC

## 2018-02-05 NOTE — Telephone Encounter (Signed)
Ronald Ellis - could you please look into this patient's ability to afford [employer] - it was over $600.  I have sent in [employer] in the mean time. Was hoping you could provide some assistance. Thanks!

## 2018-02-05 NOTE — Progress Notes (Signed)
Name: Ronald Ellis   MRN: [id number]    DOB: Apr 21, 1960   Date:02/05/2018       Progress Note  Subjective  Chief Complaint  Chief Complaint  Patient presents with  . URI    cough, congested for 3 weeks    HPI  Pt presents with concern for 3 weeks of cough, chest congestion, and now has bilateral lower lung pain (pain in the mid back, worse with deep inspiration.). Cough is productive - yellow and white sputum.  Chest tightness, wheezing, nasal congestion, ear pain and pressure, body aches, subjective fevers and chills.  Using albuterol inhaler daily; does have Breo but last refill was $500, also taking mucinex and sudafed regularly. Not using nasonex daily - advised to do so.  Prostate concerns: Weak flow, can achieve erection but it takes a lot of extra time.  He notes taking saw palmetto, but now it is affecting his BM's - stool is in sections now, not coming out easily, having to strain/push.  Nocturia has worsened and he has stopped drinking fluids after 6pm.  He notes these symptoms are worsening x38month  Did have early colonoscopy due to celiac disease - has not seen GI in almost 20 years for colonoscopy.  No blood in stool, no dark and tarry stools, no diarrhea.  IPSS Questionnaire (AUA-7): Over the past month.   1)  How often have you had a sensation of not emptying your bladder completely after you finish urinating?  5 - Almost always  2)  How often have you had to urinate again less than two hours after you finished urinating? 4 - More than half the time  3)  How often have you found you stopped and started again several times when you urinated?  0 - Not at all  4) How difficult have you found it to postpone urination?  0 - Not at all  5) How often have you had a weak urinary stream?  5 - Almost always  6) How often have you had to push or strain to begin urination?  0 - Not at all  7) How many times did you most typically get up to urinate from the time you went to bed until  the time you got up in the morning?  4 - 4 times  Total score:  0-7 mildly symptomatic   8-19 moderately symptomatic   20-35 severely symptomatic  Score of 18.  Patient Active Problem List   Diagnosis Date Noted  . COPD (ch[place] obstructive pulmonary disease) [place] 06/29/2017  . Right-sided low back pain with right-sided sciatica 12/05/2015  . Plantar fasciitis of right foot 10/15/2015  . Chronic neck and back pain 07/19/2015  . Fatigue 07/19/2015    Social History   Tobacco Use  . Smoking status: Former Smoker    Packs/day: 2.00    Years: 40.00    Pack years: 80.00    Types: Cigarettes    Last attempt to quit: 01/02/2012    Years since quitting: 6.0  . Smokeless tobacco: Never Used  . Tobacco comment: currently just a couple cigars once a month   Substance Use Topics  . Alcohol use: Not Currently    Comment: quit 5-6 years ago, only beer - but glutten free diet now     Current Outpatient Medications:  .  acetaminophen (TYLENOL) 500 MG tablet, Take 500 mg by mouth every 6 (six) hours as needed., Disp: , Rfl:  .  albuterol (PROVENTIL HFA;VENTOLIN HFA) 108 (  90 Base) MCG/ACT inhaler, Inhale 2 puffs into the lungs every 6 (six) hours as needed for wheezing or shortness of breath., Disp: 1 Inhaler, Rfl: 0 .  Ascorbic Acid (VITAMIN C PO), Take by mouth., Disp: , Rfl:  .  b complex vitamins tablet, Take 1 tablet by mouth daily., Disp: , Rfl:  .  BIOTIN PO, Take by mouth., Disp: , Rfl:  .  DULoxetine (CYMBALTA) 60 MG capsule, TAKE ONE CAPSULE BY MOUTH DAILY, Disp: 90 capsule, Rfl: 0 .  fluticasone furoate-vilanterol (BREO ELLIPTA) 100-25 MCG/INH AEPB, Inhale 1 puff into the lungs daily., Disp: 60 each, Rfl: 5 .  MAGNESIUM PO, Take by mouth., Disp: , Rfl:  .  mometasone (NASONEX) 50 MCG/ACT nasal spray, Place 2 sprays into the nose daily., Disp: 17 g, Rfl: 2 .  naproxen (NAPROSYN) 500 MG tablet, Take 1 tablet (500 mg total) by mouth 2 (two) times daily with a meal., Disp: 180 tablet,  Rfl: 0 .  tiZANidine (ZANAFLEX) 4 MG tablet, Take 1 tablet (4 mg total) by mouth at bedtime., Disp: 90 tablet, Rfl: 1 .  traZODone (DESYREL) 50 MG tablet, TAKE 1/2 TO 1 TABLET BY MOUTH DAILY AT BEDTIME AS NEEDED FOR SLEEP, Disp: 30 tablet, Rfl: 0  Allergies  Allergen Reactions  . Gluten Meal     I personally reviewed active problem list, medication list, allergies with the patient/caregiver today.  ROS  Ten systems reviewed and is negative except as mentioned in HPI.  Objective  Vitals:   02/05/18 1116  BP: 132/84  Pulse: 97  Resp: 18  Temp: 98.7 F (37.1 C)  TempSrc: Oral  SpO2: 98%  Weight: 198 lb 12.8 oz (90.2 kg)  Height: 6' 1"  (1.854 m)   Body mass index is 26.23 kg/m.  Nursing Note and Vital Signs reviewed.  Physical Exam  Constitutional: Patient appears well-developed and well-nourished.  No distress.  HEENT: head atraumatic, normocephalic, pupils equal and reactive to light, Bilateral TM's without erythema or effusion,  bilateral maxillary and frontal sinuses are non-tender, neck supple without lymphadenopathy, throat within normal limits - no erythema or exudate, no tonsillar swelling Cardiovascular: Normal rate, regular rhythm and normal heart sounds.  No murmur heard. No BLE edema. Pulmonary/Chest: Effort normal and breath sounds find rhonchi in the bases, mild expiratory wheezes in the LEFT upper lobe. No respiratory distress. Abdominal: Soft, bowel sounds normal, there is no tenderness. Psychiatric: Patient has a normal mood and affect. behavior is normal. Judgment and thought content normal. RECTAL: Prostate enlarged but with normal consistency, no rectal masses or hemorrhoids palpable. Hemoccult negative  No results found for this or any previous visit (from the past 72 hour(s)).  Assessment & Plan  1. Chronic obstructive pulmonary disease with acute exacerbation (HCC) - budesonide-formoterol (SYMBICORT) 160-4.5 MCG/ACT inhaler; Inhale 2 puffs into the  lungs 2 (two) times daily.  Dispense: 1 Inhaler; Refill: 3 - predniSONE (DELTASONE) 10 MG tablet; Take 5 tablets (50 mg total) by mouth daily with breakfast for 1 day, THEN 4 tablets (40 mg total) daily with breakfast for 1 day, THEN 3 tablets (30 mg total) daily with breakfast for 1 day, THEN 2 tablets (20 mg total) daily with breakfast for 1 day, THEN 1 tablet (10 mg total) daily with breakfast for 1 day.  Dispense: 15 tablet; Refill: 0 - azithromycin (ZITHROMAX) 250 MG tablet; Day1: Take 2 tablet; Days 2-5: Take 1 tablet daily  Dispense: 6 tablet; Refill: 0 - benzonatate (TESSALON PERLES) 100 MG capsule; Take  1-2 capsules (100-200 mg total) by mouth 3 (three) times daily as needed.  Dispense: 40 capsule; Refill: 0  2. Perennial allergic rhinitis - mometasone (NASONEX) 50 MCG/ACT nasal spray; Place 2 sprays into the nose daily.  Dispense: 17 g; Refill: 2  3. Nocturia - PSA  4. Encounter for colorectal cancer screening - Ambulatory referral to Gastroenterology - POCT Occult Blood Stool  5. Celiac disease - Ambulatory referral to Gastroenterology  -Red flags and when to present for emergency care or RTC including fever >101.7F, chest pain, shortness of breath, new/worsening/un-resolving symptoms, reviewed with patient at time of visit. Follow up and care instructions discussed and provided in AVS.

## 2018-02-05 NOTE — Patient Instructions (Signed)
Chronic Obstructive Pulmonary Disease Exacerbation  Chronic obstructive pulmonary disease (COPD) is a common lung problem. In COPD, the flow of air from the lungs is limited. COPD exacerbations are times that breathing gets worse and you need extra treatment. Without treatment they can be life threatening. If they happen often, your lungs can become more damaged. If your COPD gets worse, your doctor may treat you with:  ? Medicines.  ? Oxygen.  ? Different ways to clear your airway, such as using a mask.    Follow these instructions at home:  ? Do not smoke.  ? Avoid tobacco smoke and other things that bother your lungs.  ? If given, take your antibiotic medicine as told. Finish the medicine even if you start to feel better.  ? Only take medicines as told by your doctor.  ? Drink enough fluids to keep your pee (urine) clear or pale yellow (unless your doctor has told you not to).  ? Use a cool mist machine (vaporizer).  ? If you use oxygen or a machine that turns liquid medicine into a mist (nebulizer), continue to use them as told.  ? Keep up with shots (vaccinations) as told by your doctor.  ? Exercise regularly.  ? Eat healthy foods.  ? Keep all doctor visits as told.  Get help right away if:  ? You are very short of breath and it gets worse.  ? You have trouble talking.  ? You have bad chest pain.  ? You have blood in your spit (sputum).  ? You have a fever.  ? You keep throwing up (vomiting).  ? You feel weak, or you pass out (faint).  ? You feel confused.  ? You keep getting worse.  This information is not intended to replace advice given to you by your health care provider. Make sure you discuss any questions you have with your health care provider.  Document Released: 02/06/2011 Document Revised: 07/26/2015 Document Reviewed: 10/22/2012  Elsevier Interactive Patient Education ? 2017 Elsevier Inc.

## 2018-02-08 ENCOUNTER — Ambulatory Visit: Payer: Self-pay | Admitting: Pharmacist

## 2018-02-08 DIAGNOSIS — J441 Chronic obstructive pulmonary disease with (acute) exacerbation: Secondary | ICD-10-CM

## 2018-02-08 NOTE — Chronic Care Management (AMB) (Signed)
  Chronic Care Management   Note  02/08/2018 Name: Ronald Ellis MRN: [id number] DOB: 28-Dec-1960  57 y.o. year old male referred to Chronic Care Management by Raelyn Ensign, NP for medication assistance for [facility]. Chronic conditions include COPD, chronic pain. Last office visit with Raelyn Ensign was 02/05/18  Was unable to reach patient via telephone today and have left HIPAA compliant voicemail asking patient to return my call. (unsuccessful outreach #1).  Plan: Will follow-up within 3-5  business days via telephone.   Ruben Reason, PharmD Clinical Pharmacist [facility] [phone]

## 2018-02-08 NOTE — Telephone Encounter (Signed)
Left HIPAA compliant message for patient to call me back on my work # or at the [facility] office tomorrow.   Ruben Reason, PharmD Clinical Pharmacist [facility] [phone]

## 2018-02-11 ENCOUNTER — Ambulatory Visit: Payer: Self-pay | Admitting: Pharmacist

## 2018-02-11 DIAGNOSIS — J441 Chronic obstructive pulmonary disease with (acute) exacerbation: Secondary | ICD-10-CM

## 2018-02-11 DIAGNOSIS — J3089 Other allergic rhinitis: Secondary | ICD-10-CM

## 2018-02-11 NOTE — Chronic Care Management (AMB) (Signed)
  Care Management   Note  02/11/2018 Name: Ronald Ellis MRN: [id number] DOB: 07-08-1960   Seif Teichert is a 57 y.o. year old male who sees Steele Sizer, MD for primary care. Raelyn Ensign asked the CCM team to consult the patient for medication assistance [facility] Referral was placed 12/6//19. Telephone outreach to patient today to introduce care management services.   Plan: Mr. Millette very politely declined services. He has obtained health insurance through [employer] and his Breo prescription cost $45.   Ruben Reason, PharmD Clinical Pharmacist [place] [facility] [phone]

## 2018-02-15 ENCOUNTER — Telehealth: Payer: Self-pay | Admitting: Family Medicine

## 2018-02-15 NOTE — Telephone Encounter (Signed)
Please call patient to come in for PSA.

## 2018-02-17 NOTE — Telephone Encounter (Signed)
Left message for patient

## 2018-03-01 ENCOUNTER — Encounter: Payer: Self-pay | Admitting: *Deleted

## 2018-03-19 ENCOUNTER — Telehealth: Payer: Self-pay

## 2018-03-19 NOTE — Telephone Encounter (Signed)
Pt is calling to schedule a colonoscopy

## 2018-03-22 NOTE — Telephone Encounter (Signed)
Returned patients call this morning to schedule him for his colonoscopy.  LVM for him to call me back.  Thanks [employer]

## 2018-04-06 ENCOUNTER — Encounter: Payer: Self-pay | Admitting: Nurse Practitioner

## 2018-04-06 ENCOUNTER — Ambulatory Visit
Admission: RE | Admit: 2018-04-06 | Discharge: 2018-04-06 | Disposition: A | Discharge status: Home / Self Care | Payer: Medicare Other | Source: Ambulatory Visit | Attending: Nurse Practitioner | Admitting: Nurse Practitioner

## 2018-04-06 ENCOUNTER — Ambulatory Visit
Admission: RE | Admit: 2018-04-06 | Discharge: 2018-04-06 | Disposition: A | Discharge status: Home / Self Care | Payer: Medicare Other | Attending: Nurse Practitioner | Admitting: Nurse Practitioner

## 2018-04-06 ENCOUNTER — Ambulatory Visit (INDEPENDENT_AMBULATORY_CARE_PROVIDER_SITE_OTHER): Payer: Medicare Other | Admitting: Nurse Practitioner

## 2018-04-06 VITALS — BP 116/68 | HR 85 | Temp 98.4°F | Resp 16 | Ht 73.0 in | Wt 203.8 lb

## 2018-04-06 DIAGNOSIS — M25562 Pain in left knee: Secondary | ICD-10-CM | POA: Insufficient documentation

## 2018-04-06 DIAGNOSIS — J441 Chronic obstructive pulmonary disease with (acute) exacerbation: Secondary | ICD-10-CM | POA: Diagnosis not present

## 2018-04-06 MED ORDER — BENZONATATE 100 MG PO CAPS: 100.0000 mg | ORAL_CAPSULE | Freq: Three times a day (TID) | ORAL | 0 refills | Status: DC | PRN

## 2018-04-06 NOTE — Patient Instructions (Signed)
-   Go across the street to get xray of your left knee - You should get a phone call within the next 2 weeks about your ortho referral - Use a knee sleeve and tylenol and naproxen for pain relief.  RICE Therapy for Routine Care of Injuries Many injuries can be cared for with rest, ice, compression, and elevation (RICE therapy). This includes:  Resting the injured part.  Putting ice on the injury.  Putting pressure (compression) on the injury.  Raising the injured part (elevation). Using RICE therapy can help to lessen pain and swelling. Supplies needed:  Ice.  Plastic bag.  Towel.  Elastic bandage.  Pillow or pillows to raise (elevate) your injured body part. How to care for your injury with RICE therapy Rest Limit your normal activities, and try not to use the injured part of your body. You can go back to your normal activities when your doctor says it is okay to do them and you feel okay. Ask your doctor if you should do exercises to help your injury get better. Ice Put ice on the injured area. Do not put ice on your bare skin.  Put ice in a plastic bag.  Place a towel between your skin and the bag.  Leave the ice on for 20 minutes, 2-3 times a day. Use ice on as many days as told by your doctor.  Compression Compression means putting pressure on the injured area. This can be done with an elastic bandage. If an elastic bandage has been put on your injury:  Do not wrap the bandage too tight. Wrap the bandage more loosely if part of your body away from the bandage is blue, swollen, cold, painful, or loses feeling (gets numb).  Take off the bandage and put it on again. Do this every 3-4 hours or as told by your doctor.  See your doctor if the bandage seems to make your problems worse.  Elevation Elevation means keeping the injured area raised. If you can, raise the injured area above your heart or the center of your chest. Contact a doctor if:  You keep having pain and  swelling.  Your symptoms get worse. Get help right away if:  You have sudden bad pain at your injury or lower than your injury.  You have redness or more swelling around your injury.  You have tingling or numbness at your injury or lower than your injury, and it does not go away when you take off the bandage. Summary  Many injuries can be cared for using rest, ice, compression, and elevation (RICE therapy).  You can go back to your normal activities when you feel okay and your doctor says it is okay.  Put ice on the injured area as told by your doctor.  Get help if your symptoms get worse or if you keep having pain and swelling. This information is not intended to replace advice given to you by your health care provider. Make sure you discuss any questions you have with your health care provider. Document Released: 08/06/2007 Document Revised: 11/07/2016 Document Reviewed: 11/07/2016 Elsevier Interactive Patient Education  2019 [employer].

## 2018-04-06 NOTE — Progress Notes (Signed)
Name: Ronald Ellis   MRN: [id number]    DOB: [birth year]   Date:04/06/2018       Progress Note  Subjective  Chief Complaint  Chief Complaint  Patient presents with  . Knee Pain    patient present today with left knee pain. it's hard to get comfortable and it wakes him up at night. patient stated that it hurts right under his kneecap.  Tried: Meloxicam, Naproxen  & Tylenol  . Medication Refill    tessalon perls    HPI  Left knee pain ongoing for a month, increasing in severity and frequency. Tried Tylenol and meloxicam initially, ran out of meloxicam has been using Naprosyn with minimal relief.  States pain was so bad yesterday that he was very irritable with others and decided to come in.  Pain is in left knee medial, denies weakness.  Pain is worse with walking and bending and prolonged positioning.  States recently pain has woken him up from sleep.  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  Denies precipitating injury.   Patient Active Problem List   Diagnosis Date Noted  . Celiac disease 02/05/2018  . COPD (chronic obstructive pulmonary disease) ([place]) 06/29/2017  . Right-sided low back pain with right-sided sciatica 12/05/2015  . Plantar fasciitis of right foot 10/15/2015  . Chronic neck and back pain 07/19/2015  . Fatigue 07/19/2015    Past Medical History:  Diagnosis Date  . Chronic pain   . COPD (chronic obstructive pulmonary disease) ([place])     Past Surgical History:  Procedure Laterality Date  . BACK SURGERY  2015  . INGUINAL HERNIA REPAIR Bilateral    multiple times  . lumbar discetomy  2005  . NECK SURGERY  2016    Social History   Tobacco Use  . Smoking status: Former Smoker    Packs/day: 2.00    Years: 40.00    Pack years: 80.00    Types: Cigarettes    Last attempt to quit: 01/02/2012    Years since quitting: 6.2  . Smokeless tobacco: Never Used  . Tobacco comment: currently just a couple cigars once a month    Substance Use Topics  . Alcohol use: Not Currently    Comment: quit 5-6 years ago, only beer - but glutten free diet now     Current Outpatient Medications:  .  acetaminophen (TYLENOL) 500 MG tablet, Take 500 mg by mouth every 6 (six) hours as needed., Disp: , Rfl:  .  albuterol (PROVENTIL HFA;VENTOLIN HFA) 108 (90 Base) MCG/ACT inhaler, Inhale 2 puffs into the lungs every 6 (six) hours as needed for wheezing or shortness of breath., Disp: 1 Inhaler, Rfl: 0 .  b complex vitamins tablet, Take 1 tablet by mouth daily., Disp: , Rfl:  .  benzonatate (TESSALON PERLES) 100 MG capsule, Take 1-2 capsules (100-200 mg total) by mouth 3 (three) times daily as needed., Disp: 60 capsule, Rfl: 0 .  BIOTIN PO, Take by mouth., Disp: , Rfl:  .  budesonide-formoterol (SYMBICORT) 160-4.5 MCG/ACT inhaler, Inhale 2 puffs into the lungs 2 (two) times daily., Disp: 1 Inhaler, Rfl: 3 .  DULoxetine (CYMBALTA) 60 MG capsule, TAKE ONE CAPSULE BY MOUTH DAILY, Disp: 90 capsule, Rfl: 0 .  fluticasone furoate-vilanterol (BREO ELLIPTA) 100-25 MCG/INH AEPB, Inhale 1 puff into the lungs daily., Disp: 60 each, Rfl: 5 .  MAGNESIUM PO, Take by mouth., Disp: , Rfl:  .  mometasone (NASONEX) 50 MCG/ACT nasal spray, Place 2 sprays  into the nose daily., Disp: 17 g, Rfl: 2 .  naproxen (NAPROSYN) 500 MG tablet, Take 1 tablet (500 mg total) by mouth 2 (two) times daily with a meal., Disp: 180 tablet, Rfl: 0 .  tiZANidine (ZANAFLEX) 4 MG tablet, Take 1 tablet (4 mg total) by mouth at bedtime., Disp: 90 tablet, Rfl: 1 .  traZODone (DESYREL) 50 MG tablet, TAKE 1/2 TO 1 TABLET BY MOUTH DAILY AT BEDTIME AS NEEDED FOR SLEEP, Disp: 30 tablet, Rfl: 0 .  VITAMIN A PO, Take 150 mg by mouth., Disp: , Rfl:  .  Ascorbic Acid (VITAMIN C PO), Take by mouth., Disp: , Rfl:   Allergies  Allergen Reactions  . Gluten Meal     ROS  No other specific complaints in a complete review of systems (except as listed in HPI above).  Objective  Vitals:    04/06/18 0945  BP: 116/68  Pulse: 85  Resp: 16  Temp: 98.4 F (36.9 C)  TempSrc: Oral  SpO2: 99%  Weight: 203 lb 12.8 oz (92.4 kg)  Height: 6' 1"  (1.854 m)   Body mass index is 26.89 kg/m.  Nursing Note and Vital Signs reviewed.  Physical Exam Constitutional:      Appearance: Normal appearance.  HENT:     Head: Normocephalic and atraumatic.     Nose: Nose normal.  Cardiovascular:     Rate and Rhythm: Normal rate.  Pulmonary:     Effort: Pulmonary effort is normal.  Musculoskeletal:     Left knee: He exhibits normal range of motion, no swelling, no effusion, no erythema and no bony tenderness. Tenderness found. Medial joint line tenderness noted.  Skin:    General: Skin is warm and dry.     Findings: No rash.  Neurological:     General: No focal deficit present.     Mental Status: He is alert and oriented to person, place, and time.  Psychiatric:        Mood and Affect: Mood normal.        Thought Content: Thought content normal.       No results found for this or any previous visit (from the past 48 hour(s)).  Assessment & Plan  1. Acute pain of left knee Declines refill of NSAID, already taking muscle relaxer - DG Knee Complete 4 Views Left; Future - Ambulatory referral to Orthopedic Surgery  2. Chronic obstructive pulmonary disease with acute exacerbation (HCC) Requesting refill - benzonatate (TESSALON PERLES) 100 MG capsule; Take 1-2 capsules (100-200 mg total) by mouth 3 (three) times daily as needed.  Dispense: 60 capsule; Refill: 0

## 2018-04-13 DIAGNOSIS — M25561 Pain in right knee: Secondary | ICD-10-CM | POA: Diagnosis not present

## 2018-04-14 ENCOUNTER — Ambulatory Visit: Payer: Medicare Other | Admitting: Family Medicine

## 2018-04-21 ENCOUNTER — Telehealth: Payer: Self-pay | Admitting: Family Medicine

## 2018-04-21 DIAGNOSIS — M549 Dorsalgia, unspecified: Principal | ICD-10-CM

## 2018-04-21 DIAGNOSIS — M542 Cervicalgia: Secondary | ICD-10-CM

## 2018-04-21 DIAGNOSIS — G8929 Other chronic pain: Principal | ICD-10-CM

## 2018-04-21 NOTE — Telephone Encounter (Signed)
He was seen by Benjamine Mola for an acute problem, he needs follow up with me, sending 30 days, needs appointment

## 2018-04-21 NOTE — Telephone Encounter (Signed)
Refill request for general medication: Cymbalta 60 mg  Last office visit: 04/06/2018  Last physical exam: None indicated  Follow-ups on file. None indicated

## 2018-04-22 NOTE — Telephone Encounter (Signed)
Pt made appt for 3.25.2020 (your first availability)

## 2018-04-26 DIAGNOSIS — M25561 Pain in right knee: Secondary | ICD-10-CM | POA: Diagnosis not present

## 2018-04-28 ENCOUNTER — Other Ambulatory Visit: Payer: Self-pay | Admitting: Family Medicine

## 2018-04-28 DIAGNOSIS — J449 Chronic obstructive pulmonary disease, unspecified: Secondary | ICD-10-CM

## 2018-04-28 NOTE — Telephone Encounter (Signed)
Refill request for general medication: Naproxen 500 mg  Last office visit: 04/06/2018  Last physical exam: None indicated  Follow-ups on file. 05/26/2018

## 2018-04-29 DIAGNOSIS — M25562 Pain in left knee: Secondary | ICD-10-CM | POA: Diagnosis not present

## 2018-04-29 DIAGNOSIS — M25561 Pain in right knee: Secondary | ICD-10-CM | POA: Diagnosis not present

## 2018-05-03 ENCOUNTER — Other Ambulatory Visit: Payer: Self-pay | Admitting: Family Medicine

## 2018-05-03 DIAGNOSIS — J3089 Other allergic rhinitis: Secondary | ICD-10-CM

## 2018-05-25 ENCOUNTER — Other Ambulatory Visit: Payer: Self-pay

## 2018-05-25 ENCOUNTER — Ambulatory Visit (INDEPENDENT_AMBULATORY_CARE_PROVIDER_SITE_OTHER): Payer: Medicare Other | Admitting: Family Medicine

## 2018-05-25 ENCOUNTER — Encounter: Payer: Self-pay | Admitting: Family Medicine

## 2018-05-25 VITALS — BP 132/88 | HR 57 | Temp 98.5°F | Resp 16 | Ht 73.0 in | Wt 197.7 lb

## 2018-05-25 DIAGNOSIS — Z79899 Other long term (current) drug therapy: Secondary | ICD-10-CM | POA: Diagnosis not present

## 2018-05-25 DIAGNOSIS — M25562 Pain in left knee: Secondary | ICD-10-CM

## 2018-05-25 DIAGNOSIS — E7800 Pure hypercholesterolemia, unspecified: Secondary | ICD-10-CM

## 2018-05-25 DIAGNOSIS — R739 Hyperglycemia, unspecified: Secondary | ICD-10-CM | POA: Diagnosis not present

## 2018-05-25 DIAGNOSIS — R0989 Other specified symptoms and signs involving the circulatory and respiratory systems: Secondary | ICD-10-CM

## 2018-05-25 DIAGNOSIS — M25561 Pain in right knee: Secondary | ICD-10-CM

## 2018-05-25 DIAGNOSIS — J441 Chronic obstructive pulmonary disease with (acute) exacerbation: Secondary | ICD-10-CM

## 2018-05-25 DIAGNOSIS — M542 Cervicalgia: Secondary | ICD-10-CM | POA: Diagnosis not present

## 2018-05-25 DIAGNOSIS — F325 Major depressive disorder, single episode, in full remission: Secondary | ICD-10-CM | POA: Diagnosis not present

## 2018-05-25 DIAGNOSIS — M549 Dorsalgia, unspecified: Secondary | ICD-10-CM

## 2018-05-25 DIAGNOSIS — F338 Other recurrent depressive disorders: Secondary | ICD-10-CM

## 2018-05-25 DIAGNOSIS — G4700 Insomnia, unspecified: Secondary | ICD-10-CM

## 2018-05-25 DIAGNOSIS — G8929 Other chronic pain: Secondary | ICD-10-CM

## 2018-05-25 MED ORDER — LEVOFLOXACIN 500 MG PO TABS: 500.0000 mg | ORAL_TABLET | Freq: Every day | ORAL | 0 refills | Status: DC

## 2018-05-25 MED ORDER — PREDNISONE 20 MG PO TABS: 20.0000 mg | ORAL_TABLET | Freq: Every day | ORAL | 0 refills | Status: DC

## 2018-05-25 MED ORDER — FLUTICASONE-UMECLIDIN-VILANT 100-62.5-25 MCG/INH IN AEPB: 1.0000 | INHALATION_SPRAY | Freq: Every day | RESPIRATORY_TRACT | 5 refills | Status: DC

## 2018-05-25 MED ORDER — DULOXETINE HCL 60 MG PO CPEP: 60.0000 mg | ORAL_CAPSULE | Freq: Every day | ORAL | 1 refills | Status: DC

## 2018-05-25 MED ORDER — BENZONATATE 200 MG PO CAPS: 200.0000 mg | ORAL_CAPSULE | Freq: Three times a day (TID) | ORAL | 0 refills | Status: DC | PRN

## 2018-05-25 NOTE — Progress Notes (Signed)
Name: Ronald Ellis   MRN: [id number]    DOB: 02/28/1961   Date:05/25/2018       Progress Note  Subjective  Chief Complaint  Chief Complaint  Patient presents with  . Cough    Productive cough for the past week-yellow phlegm and has been taking Mucinex with no relief  . Nasal Congestion  . Hand Pain    Burn his index finger and wanted it looked at     HPI  Chronic pain:he has a long history of neck and lower back pain, symptoms started after a work related injury in 2014. He did not have insurance, but finally under disability 2019  He states muscle relaxer seems to helps with muscle spasms and only taking prn. He has a long history of drug abuse and even though he used to take hydrocodone ( given by his previous PCP), he is now only on Tylenol about 2500 mg daily , naproxen at lunch and states duloxetine has helped, pain is still daily but down from 8/10 to 4/10 .   COPD :third flare in the past year. He has a chronic cough,  that is productive yellowish to white. He only has SOB when active in the heat only unless he has a flare, this episodes started about one week ago with increase in cough and sputum productive, also feels congestion in his chest, no fever or chills. Recent travel to [facility] returned one week ago, but no  know exposure to COVID-19. He has been using Breo, we will switch to Trelegy , discussed prednisone and to avoid with nsaid use and take second dose by 5 pm   Bilateral knee pain: seen by Ortho and had steroid injection and is doing well now, it locks on him at times, but no effusion, pain is very mild   Major Depression: took Lexapro in the past but did not work, used to be seasonal affective disorder before back injury, however his life changed significantly. He used to be very active and adventures ( used to scuba dive/rock climbing, riding dirt bikes...). He raised his children and grandchildren outdoors and now he cannot do any of that. He started on  Duloxetine 60 mg last Spring and is doing very well mood wise, he states even this past winter he has done well. He also noticed improvement of pain control and denies side effects   Hyperglycemia: last hgbA1C was normal, no polyphagia, polyuria or polydipsia, recheck today   Thrombocytosis: we will recheck labs today   Dyslipidemia: he has a very low HDL, LDL not very high, no significant family history of heart disease. He eats well but not able to be as active. We will recheck today   Snoring: loud , also has pauses during sleep, advised sleep study, he states he will go if covered by insurance , he states he would never wear a CPAP, therefore we will not schedule it, discussed tenis ball on his back   Patient Active Problem List   Diagnosis Date Noted  . Celiac disease 02/05/2018  . COPD (chronic obstructive pulmonary disease) ([place]) 06/29/2017  . Right-sided low back pain with right-sided sciatica 12/05/2015  . Plantar fasciitis of right foot 10/15/2015  . Chronic neck and back pain 07/19/2015  . Fatigue 07/19/2015    Past Surgical History:  Procedure Laterality Date  . BACK SURGERY  2015  . INGUINAL HERNIA REPAIR Bilateral    multiple times  . lumbar discetomy  2005  . NECK SURGERY  2016    No family history on file.  Social History   Socioeconomic History  . Marital status: Soil scientist    Spouse name: [name]  . Number of children: 4  . Years of education: Not on file  . Highest education level: Some college, no degree  Occupational History  . 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 Financial resource strain: Not hard at all  . Food insecurity:    Worry: Never true    Inability: Never true  . Transportation needs:    Medical: No    Non-medical: No  Tobacco Use  . Smoking status: Former Smoker    Packs/day: 2.00    Years: 40.00    Pack years: 80.00    Types: Cigarettes    Last attempt to  quit: 01/02/2012    Years since quitting: 6.3  . Smokeless tobacco: Never Used  . Tobacco comment: currently just a couple cigars once a month   Substance and Sexual Activity  . Alcohol use: Not Currently    Comment: quit 5-6 years ago, only beer - but glutten free diet now  . Drug use: Yes    Types: Cocaine, Marijuana, Heroin, "Crack" cocaine, Other-see comments    Comment: meths in his 20's-30's  . Sexual activity: Yes    Partners: Female    Birth control/protection: Post-menopausal  Lifestyle  . Physical activity:    Days per week: 0 days    Minutes per session: 0 min  . Stress: Not at all  Relationships  . Social connections:    Talks on phone: More than three times a week    Gets together: Never    Attends religious service: Never    Active member of club or organization: No    Attends meetings of clubs or organizations: Never    Relationship status: Living with partner  . Intimate partner violence:    Fear of current or ex partner: No    Emotionally abused: No    Physically abused: No    Forced sexual activity: No  Other Topics Concern  . Not on file  Social History Narrative   Approved for disability 06/2017 ( workman's comp back 2014) , secondary to neck and back injury      Current Outpatient Medications:  .  acetaminophen (TYLENOL) 500 MG tablet, Take 500 mg by mouth every 6 (six) hours as needed., Disp: , Rfl:  .  albuterol (PROVENTIL HFA;VENTOLIN HFA) 108 (90 Base) MCG/ACT inhaler, Inhale 2 puffs into the lungs every 6 (six) hours as needed for wheezing or shortness of breath., Disp: 1 Inhaler, Rfl: 0 .  Ascorbic Acid (VITAMIN C PO), Take by mouth., Disp: , Rfl:  .  b complex vitamins tablet, Take 1 tablet by mouth daily., Disp: , Rfl:  .  benzonatate (TESSALON PERLES) 100 MG capsule, Take 1-2 capsules (100-200 mg total) by mouth 3 (three) times daily as needed., Disp: 60 capsule, Rfl: 0 .  BIOTIN PO, Take by mouth., Disp: , Rfl:  .  budesonide-formoterol  (SYMBICORT) 160-4.5 MCG/ACT inhaler, Inhale 2 puffs into the lungs 2 (two) times daily., Disp: 1 Inhaler, Rfl: 3 .  DULoxetine (CYMBALTA) 60 MG capsule, TAKE ONE CAPSULE BY MOUTH DAILY, Disp: 30 capsule, Rfl: 0 .  fluticasone furoate-vilanterol (BREO ELLIPTA) 100-25 MCG/INH AEPB, Inhale 1 puff into the lungs daily., Disp: 60 each, Rfl: 5 .  MAGNESIUM PO, Take by mouth., Disp: ,  Rfl:  .  mometasone (NASONEX) 50 MCG/ACT nasal spray, USE 2 SPRAYS IN EACH NOSTRIL DAILY, Disp: 17 g, Rfl: 2 .  naproxen (NAPROSYN) 500 MG tablet, TAKE 1 TABLET BY MOUTH TWICE DAILY WITH MEALS, Disp: 180 tablet, Rfl: 0 .  tiZANidine (ZANAFLEX) 4 MG tablet, Take 1 tablet (4 mg total) by mouth at bedtime., Disp: 90 tablet, Rfl: 1 .  traZODone (DESYREL) 50 MG tablet, TAKE 1/2 TO 1 TABLET BY MOUTH DAILY AT BEDTIME AS NEEDED FOR SLEEP, Disp: 30 tablet, Rfl: 0 .  VITAMIN A PO, Take 150 mg by mouth., Disp: , Rfl:   Allergies  Allergen Reactions  . Gluten Meal     I personally reviewed active problem list, medication list, allergies, family history, social history with the patient/caregiver today.   ROS  Constitutional: Negative for fever or weight change.  Respiratory: positive  for cough and mild   shortness of breath. With activity    Cardiovascular: Negative for chest pain or palpitations.  Gastrointestinal: Negative for abdominal pain, no bowel changes.  Musculoskeletal: Negative for gait problem or joint swelling.  Skin: Negative for rash.  Neurological: Negative for dizziness or headache.  No other specific complaints in a complete review of systems (except as listed in HPI above).  Objective  Vitals:   05/25/18 1403  BP: 132/88  Pulse: (!) 57  Resp: 16  Temp: 98.5 F (36.9 C)  TempSrc: Oral  SpO2: 97%  Weight: 197 lb 11.2 oz (89.7 kg)  Height: 6' 1"  (1.854 m)    Body mass index is 26.08 kg/m.  Physical Exam   Constitutional: Patient appears well-developed and well-nourished. OverweightNo  distress.  HEENT: head atraumatic, normocephalic, pupils equal and reactive to light,  neck supple, throat within normal limits Cardiovascular: Normal rate, regular rhythm and normal heart sounds.  No murmur heard. No BLE edema. Pulmonary/Chest: Effort normal and breath, crackles on right lower base . No respiratory distress. Abdominal: Soft.  There is no tenderness. Psychiatric: Patient has a normal mood and affect. behavior is normal. Judgment and thought content normal Muscular Skeletal: tender during palpation of lumbar spine, negative straight leg raise Skin: second degree burn on right index finger, blister still present, goes over PID, but normal rom, discussed keeping area clean and use neosporin or A& D ointment    PHQ2/9: Depression screen [facility] 2/9 05/25/2018 04/06/2018 02/05/2018 10/12/2017 10/01/2016  Decreased Interest 0 0 0 1 0  Down, Depressed, Hopeless 0 0 0 1 0  PHQ - 2 Score 0 0 0 2 0  Altered sleeping 0 0 1 3 -  Tired, decreased energy 0 0 0 1 -  Change in appetite 0 0 0 1 -  Feeling bad or failure about yourself  0 0 0 1 -  Trouble concentrating 0 0 0 1 -  Moving slowly or fidgety/restless 0 0 0 1 -  Suicidal thoughts 0 0 0 0 -  PHQ-9 Score 0 0 1 10 -  Difficult doing work/chores Not difficult at all Not difficult at all Not difficult at all Not difficult at all -     Fall Risk: Fall Risk  05/25/2018 04/06/2018 02/05/2018 10/12/2017 06/29/2017  Falls in the past year? 0 0 0 No No  Number falls in past yr: 0 0 0 - -  Injury with Fall? 0 0 0 - -     Assessment & Plan  1. Chronic obstructive pulmonary disease with acute exacerbation (HCC)  - benzonatate (TESSALON) 200 MG capsule; Take  1 capsule (200 mg total) by mouth 3 (three) times daily as needed.  Dispense: 90 capsule; Refill: 0 - Fluticasone-Umeclidin-Vilant (TRELEGY ELLIPTA) 100-62.5-25 MCG/INH AEPB; Inhale 1 puff into the lungs daily.  Dispense: 60 each; Refill: 5 - levofloxacin (LEVAQUIN) 500 MG tablet; Take 1  tablet (500 mg total) by mouth daily.  Dispense: 7 tablet; Refill: 0  2. Chronic neck and back pain  - DULoxetine (CYMBALTA) 60 MG capsule; Take 1 capsule (60 mg total) by mouth daily.  Dispense: 90 capsule; Refill: 1  3. Major depression in remission ([place])  Continue medication   4. Seasonal affective disorder ([place])   5. Long-term use of high-risk medication  - COMPLETE METABOLIC PANEL WITH GFR - CBC with Differential/Platelet  6. Insomnia, unspecified type  Continue prn medication   7. Pure hypercholesterolemia  - Lipid panel  8. Hyperglycemia  - Hemoglobin A1c  9. Chronic pain of both knees  Keep follow up with ortho   10. Respiratory crackles at right lung base  We will hold off on CXR because of COVID -19 we will hold off on checking it now, but advised to call back if no improvement, also discussed risk of levaquin including tendon lesions - levofloxacin (LEVAQUIN) 500 MG tablet; Take 1 tablet (500 mg total) by mouth daily.  Dispense: 7 tablet; Refill: 0

## 2018-05-26 ENCOUNTER — Ambulatory Visit: Payer: Medicare Other | Admitting: Family Medicine

## 2018-05-26 LAB — COMPLETE METABOLIC PANEL WITH GFR
AG Ratio: 1.7 (calc) (ref 1.0–2.5)
ALT: 33 U/L (ref 9–46)
AST: 24 U/L (ref 10–35)
Albumin: 4.1 g/dL (ref 3.6–5.1)
Alkaline phosphatase (APISO): 64 U/L (ref 35–144)
BILIRUBIN TOTAL: 0.6 mg/dL (ref 0.2–1.2)
BUN: 22 mg/dL (ref 7–25)
CO2: 26 mmol/L (ref 20–32)
Calcium: 9.8 mg/dL (ref 8.6–10.3)
Chloride: 103 mmol/L (ref 98–110)
Creat: 0.92 mg/dL (ref 0.70–1.33)
GFR, Est African American: 107 mL/min/{1.73_m2} (ref >=60)
GFR, Est Non African American: 93 mL/min/{1.73_m2} (ref >=60)
Globulin: 2.4 g/dL (calc) (ref 1.9–3.7)
Glucose, Bld: 86 mg/dL (ref 65–99)
Potassium: 4.6 mmol/L (ref 3.5–5.3)
Sodium: 136 mmol/L (ref 135–146)
Total Protein: 6.5 g/dL (ref 6.1–8.1)

## 2018-05-26 LAB — CBC WITH DIFFERENTIAL/PLATELET
Absolute Monocytes: 835 cells/uL (ref 200–950)
Basophils Absolute: 46 cells/uL (ref 0–200)
Basophils Relative: 0.4 %
Eosinophils Absolute: 174 cells/uL (ref 15–500)
Eosinophils Relative: 1.5 %
HEMATOCRIT: 44.7 % (ref 38.5–50.0)
Hemoglobin: 15.6 g/dL (ref 13.2–17.1)
LYMPHS ABS: 2796 {cells}/uL (ref 850–3900)
MCH: 33.8 pg — ABNORMAL HIGH (ref 27.0–33.0)
MCHC: 34.9 g/dL (ref 32.0–36.0)
MCV: 97 fL (ref 80.0–100.0)
MPV: 10.4 fL (ref 7.5–12.5)
Monocytes Relative: 7.2 %
NEUTROS ABS: 7749 {cells}/uL (ref 1500–7800)
Neutrophils Relative %: 66.8 %
Platelets: 260 10*3/uL (ref 140–400)
RBC: 4.61 10*6/uL (ref 4.20–5.80)
RDW: 12.4 % (ref 11.0–15.0)
Total Lymphocyte: 24.1 %
WBC: 11.6 10*3/uL — ABNORMAL HIGH (ref 3.8–10.8)

## 2018-05-26 LAB — HEMOGLOBIN A1C
Hgb A1c MFr Bld: 5.4 % of total Hgb (ref <5.7)
Mean Plasma Glucose: 108 (calc)
eAG (mmol/L): 6 (calc)

## 2018-05-26 LAB — LIPID PANEL
Cholesterol: 185 mg/dL (ref <200)
HDL: 46 mg/dL (ref >=40)
LDL Cholesterol (Calc): 120 mg/dL (calc) — ABNORMAL HIGH
Non-HDL Cholesterol (Calc): 139 mg/dL (calc) — ABNORMAL HIGH (ref <130)
Total CHOL/HDL Ratio: 4 (calc) (ref <5.0)
Triglycerides: 88 mg/dL (ref <150)

## 2018-10-26 ENCOUNTER — Other Ambulatory Visit: Payer: Self-pay | Admitting: Family Medicine

## 2018-10-26 DIAGNOSIS — J449 Chronic obstructive pulmonary disease, unspecified: Secondary | ICD-10-CM

## 2018-11-26 ENCOUNTER — Encounter: Payer: Self-pay | Admitting: Family Medicine

## 2018-11-26 ENCOUNTER — Other Ambulatory Visit: Payer: Self-pay

## 2018-11-26 ENCOUNTER — Ambulatory Visit (INDEPENDENT_AMBULATORY_CARE_PROVIDER_SITE_OTHER): Payer: Medicare Other | Admitting: Family Medicine

## 2018-11-26 VITALS — BP 116/80 | HR 90 | Temp 97.8°F | Resp 16 | Ht 73.0 in | Wt 189.4 lb

## 2018-11-26 DIAGNOSIS — Z1211 Encounter for screening for malignant neoplasm of colon: Secondary | ICD-10-CM | POA: Diagnosis not present

## 2018-11-26 DIAGNOSIS — F1921 Other psychoactive substance dependence, in remission: Secondary | ICD-10-CM | POA: Insufficient documentation

## 2018-11-26 DIAGNOSIS — Z87891 Personal history of nicotine dependence: Secondary | ICD-10-CM | POA: Diagnosis not present

## 2018-11-26 DIAGNOSIS — L909 Atrophic disorder of skin, unspecified: Secondary | ICD-10-CM | POA: Diagnosis not present

## 2018-11-26 DIAGNOSIS — Z23 Encounter for immunization: Secondary | ICD-10-CM

## 2018-11-26 DIAGNOSIS — R238 Other skin changes: Secondary | ICD-10-CM

## 2018-11-26 DIAGNOSIS — J449 Chronic obstructive pulmonary disease, unspecified: Secondary | ICD-10-CM

## 2018-11-26 DIAGNOSIS — L84 Corns and callosities: Secondary | ICD-10-CM

## 2018-11-26 DIAGNOSIS — G8929 Other chronic pain: Secondary | ICD-10-CM

## 2018-11-26 DIAGNOSIS — M549 Dorsalgia, unspecified: Secondary | ICD-10-CM

## 2018-11-26 DIAGNOSIS — F321 Major depressive disorder, single episode, moderate: Secondary | ICD-10-CM | POA: Diagnosis not present

## 2018-11-26 DIAGNOSIS — M542 Cervicalgia: Secondary | ICD-10-CM

## 2018-11-26 DIAGNOSIS — R454 Irritability and anger: Secondary | ICD-10-CM

## 2018-11-26 MED ORDER — BREO ELLIPTA 100-25 MCG/INH IN AEPB: 1.0000 | INHALATION_SPRAY | Freq: Every day | RESPIRATORY_TRACT | 0 refills | Status: DC

## 2018-11-26 MED ORDER — HYDROXYZINE HCL 10 MG PO TABS: 10.0000 mg | ORAL_TABLET | Freq: Three times a day (TID) | ORAL | 0 refills | Status: DC | PRN

## 2018-11-26 MED ORDER — DULOXETINE HCL 60 MG PO CPEP: 60.0000 mg | ORAL_CAPSULE | Freq: Every day | ORAL | 1 refills | Status: DC

## 2018-11-26 MED ORDER — PREDNISONE 10 MG PO TABS: 10.0000 mg | ORAL_TABLET | Freq: Every day | ORAL | 0 refills | Status: DC

## 2018-11-26 MED ORDER — ARIPIPRAZOLE 2 MG PO TABS: 2.0000 mg | ORAL_TABLET | Freq: Every day | ORAL | 0 refills | Status: DC

## 2018-11-26 MED ORDER — NAPROXEN 500 MG PO TABS: 500.0000 mg | ORAL_TABLET | Freq: Two times a day (BID) | ORAL | 1 refills | Status: DC

## 2018-11-26 NOTE — Progress Notes (Signed)
Name: Ronald Ellis   MRN: [id number]    DOB: 1961/01/09   Date:11/26/2018       Progress Note  Subjective  Chief Complaint  Chief Complaint  Patient presents with  . Depression  . Back Pain  . COPD    He has a productive cough and chest tightness. He is requesting more Levaquin and Prednisone. He thinks that all symptoms are related to COPD.  Marland Kitchen Fatigue    HPI   Chronic pain:he has a long history of neck and lower back pain, symptoms started after a work related injury in 2014. He did not have insurance, but finally under disability2019He states muscle relaxer seems to helps with muscle spasmsand only taking prn, he still has tizanidine at home. He has a long history of drug abuse and even though he used to take hydrocodone ( given by his previous PCP), he is now only on Tylenol about 2000 mg per day , naproxen at lunch and states duloxetine has helped, pain is still daily but down from 8/10 to average 5/10. He states his back pain is mostly with position shifting, but he also has pain and numbness on hands since neck surgery and knees intermittently   COPD :forth  flare in the past year. He has a chronic cough,  that is productive yellowish to white.  also feels congestion in his chest, no fever or chills. He states that he improves with prednisone, but gradually gets worse again. This time around it started about one month ago, when he started helping his daughter remodeling her house ( the place has been very dusty) He states he also does not like Trelegy and asked to go back on Breo or Symbicort    Major Depression: took Lexapro in the past but did not work,used to be seasonal affective disorder before back injury, however his life changed significantly. He used to be very active and adventures ( used to scuba dive/rock climbing, riding dirt bikes...). He raised his children and grandchildren outdoors and now he cannot do any of that. He started on Duloxetine 60 mg  Spring 2019  and  was  doing very well mood wise, however recently he has noticed that he has been very moody, anger outburst that is worse in the evening. He loses his patient with his girlfriend and grandchildren and makes him feel bad Discussed options and we will add Abilify 2 mg and see if improves, also hydroxyzine prn   Hyperglycemia:last hgbA1C was normal, no polyphagia, polyuria or polydipsia  Thrombocytosis: last platelets was back to normal range   Dyslipidemia:he has a very low HDL, LDL not very high, no significant family history of heart disease. He eats well but not able to be as active.  The 10-year ASCVD risk score Mikey Bussing DC Jr., et al., 2013) is: 5.1%   Values used to calculate the score:     Age: 59 years     Sex: Male     Is Non-Hispanic African American: No     Diabetic: No     Tobacco smoker: No     Systolic Blood Pressure: 938 mmHg     Is BP treated: No     HDL Cholesterol: 46 mg/dL     Total Cholesterol: 185 mg/dL  Snoring: loud , also has pauses during sleep, advised sleep study, he states he will go if covered by insurance , he states he would never wear a CPAP, therefore we will not schedule it, discussed tenis ball on  his back . Unchanged   Foot pain: he has notice a bump on right 5th toe and it is now painful with some skin irritation/[place]. Discussed Tdap and we will refer him to podiatrist   Patient Active Problem List   Diagnosis Date Noted  . History of drug dependence/abuse ([place]) 11/26/2018  . Celiac disease 02/05/2018  . COPD (chronic obstructive pulmonary disease) ([place]) 06/29/2017  . Right-sided low back pain with right-sided sciatica 12/05/2015  . Plantar fasciitis of right foot 10/15/2015  . Chronic neck and back pain 07/19/2015  . Fatigue 07/19/2015    Past Surgical History:  Procedure Laterality Date  . BACK SURGERY  2015  . INGUINAL HERNIA REPAIR Bilateral    multiple times  . lumbar discetomy  2005  . NECK SURGERY  2016    History  reviewed. No pertinent family history.  Social History   Socioeconomic History  . Marital status: Soil scientist    Spouse name: [name]  . Number of children: 4  . Years of education: Not on file  . Highest education level: Some college, no degree  Occupational History  . 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 Financial resource strain: Not hard at all  . Food insecurity    Worry: Never true    Inability: Never true  . Transportation needs    Medical: No    Non-medical: No  Tobacco Use  . Smoking status: Former Smoker    Packs/day: 1.50    Years: 40.00    Pack years: 60.00    Types: Cigarettes    Quit date: 01/02/2012    Years since quitting: 6.9  . Smokeless tobacco: Never Used  . Tobacco comment: currently just a couple cigars once a month   Substance and Sexual Activity  . Alcohol use: Not Currently    Comment: quit 5-6 years ago, only beer - but glutten free diet now  . Drug use: Yes    Types: Cocaine, Marijuana, Heroin, "Crack" cocaine, Other-see comments    Comment: meths in his 20's-30's  . Sexual activity: Yes    Partners: Female    Birth control/protection: Post-menopausal  Lifestyle  . Physical activity    Days per week: 0 days    Minutes per session: 0 min  . Stress: Not at all  Relationships  . Social Herbalist on phone: More than three times a week    Gets together: Never    Attends religious service: Never    Active member of club or organization: No    Attends meetings of clubs or organizations: Never    Relationship status: Living with partner  . Intimate partner violence    Fear of current or ex partner: No    Emotionally abused: No    Physically abused: No    Forced sexual activity: No  Other Topics Concern  . Not on file  Social History Narrative   Approved for disability 06/2017 ( workman's comp back 2014) , secondary to neck and back injury      Current  Outpatient Medications:  .  acetaminophen (TYLENOL) 500 MG tablet, Take 500 mg by mouth every 6 (six) hours as needed., Disp: , Rfl:  .  albuterol (PROVENTIL HFA;VENTOLIN HFA) 108 (90 Base) MCG/ACT inhaler, Inhale 2 puffs into the lungs every 6 (six) hours as needed for wheezing or shortness of breath., Disp: 1 Inhaler, Rfl: 0 .  Ascorbic Acid (VITAMIN C PO), Take by mouth., Disp: , Rfl:  .  b complex vitamins tablet, Take 1 tablet by mouth daily., Disp: , Rfl:  .  BIOTIN PO, Take by mouth., Disp: , Rfl:  .  DULoxetine (CYMBALTA) 60 MG capsule, Take 1 capsule (60 mg total) by mouth daily., Disp: 90 capsule, Rfl: 1 .  MAGNESIUM PO, Take by mouth., Disp: , Rfl:  .  mometasone (NASONEX) 50 MCG/ACT nasal spray, USE 2 SPRAYS IN EACH NOSTRIL DAILY, Disp: 17 g, Rfl: 2 .  naproxen (NAPROSYN) 500 MG tablet, Take 1 tablet (500 mg total) by mouth 2 (two) times daily with a meal., Disp: 180 tablet, Rfl: 1 .  tiZANidine (ZANAFLEX) 4 MG tablet, Take 1 tablet (4 mg total) by mouth at bedtime., Disp: 90 tablet, Rfl: 1 .  traZODone (DESYREL) 50 MG tablet, TAKE 1/2 TO 1 TABLET BY MOUTH DAILY AT BEDTIME AS NEEDED FOR SLEEP, Disp: 30 tablet, Rfl: 0 .  VITAMIN A PO, Take 150 mg by mouth., Disp: , Rfl:  .  ARIPiprazole (ABILIFY) 2 MG tablet, Take 1 tablet (2 mg total) by mouth daily., Disp: 30 tablet, Rfl: 0 .  fluticasone furoate-vilanterol (BREO ELLIPTA) 100-25 MCG/INH AEPB, Inhale 1 puff into the lungs daily., Disp: 180 each, Rfl: 0 .  hydrOXYzine (ATARAX/VISTARIL) 10 MG tablet, Take 1 tablet (10 mg total) by mouth 3 (three) times daily as needed., Disp: 30 tablet, Rfl: 0 .  predniSONE (DELTASONE) 10 MG tablet, Take 1 tablet (10 mg total) by mouth daily with breakfast., Disp: 10 tablet, Rfl: 0  Allergies  Allergen Reactions  . Gluten Meal     I personally reviewed active problem list, medication list, allergies, family history, social history, health maintenance with the patient/caregiver  today.   ROS  Constitutional: Negative for fever or weight change.  Respiratory: Positive for cough and intermittent  shortness of breath.   Cardiovascular: Negative for chest pain or palpitations.  Gastrointestinal: Negative for abdominal pain, no bowel changes.  Musculoskeletal: Negative for gait problem or joint swelling.  Skin: Negative for rash.  Neurological: Negative for dizziness or headache.  No other specific complaints in a complete review of systems (except as listed in HPI above).  Objective  Vitals:   11/26/18 1035  BP: 116/80  Pulse: 90  Resp: 16  Temp: 97.8 F (36.6 C)  TempSrc: Temporal  SpO2: 98%  Weight: 189 lb 6.4 oz (85.9 kg)  Height: 6' 1"  (1.854 m)    Body mass index is 24.99 kg/m.  Physical Exam  Constitutional: Patient appears well-developed and well-nourishedNo distress.  HEENT: head atraumatic, normocephalic, pupils equal and reactive to light Cardiovascular: Normal rate, regular rhythm and normal heart sounds.  No murmur heard. No BLE edema. Pulmonary/Chest: Effort normal and breath sounds normal. No respiratory distress. Abdominal: Soft.  There is no tenderness. Psychiatric: Patient has a normal mood and affect. behavior is normal. Judgment and thought content normal. Muscular Skeletal: pain during palpation of lumbar spine, negative straight leg raise  PHQ2/9: Depression screen [facility] 2/9 11/26/2018 11/26/2018 05/25/2018 04/06/2018 02/05/2018  Decreased Interest 1 0 0 0 0  Down, Depressed, Hopeless 1 0 0 0 0  PHQ - 2 Score 2 0 0 0 0  Altered sleeping 2 0 0 0 1  Tired, decreased energy 1 0 0 0 0  Change in appetite 2 0 0 0 0  Feeling bad or failure about yourself  0 0 0 0 0  Trouble concentrating 3 0 0  0 0  Moving slowly or fidgety/restless 1 0 0 0 0  Suicidal thoughts 0 0 0 0 0  PHQ-9 Score 11 0 0 0 1  Difficult doing work/chores Very difficult - Not difficult at all Not difficult at all Not difficult at all    phq 9 is positive  GAD 7 :  Generalized Anxiety Score 11/26/2018 10/12/2017  Nervous, Anxious, on Edge 3 1  Control/stop worrying 1 0  Worry too much - different things 1 0  Trouble relaxing 1 1  Restless 2 1  Easily annoyed or irritable 3 1  Afraid - awful might happen 0 0  Total GAD 7 Score 11 4  Anxiety Difficulty Very difficult -      Fall Risk: Fall Risk  11/26/2018 05/25/2018 04/06/2018 02/05/2018 10/12/2017  Falls in the past year? 1 0 0 0 No  Number falls in past yr: 0 0 0 0 -  Injury with Fall? 0 0 0 0 -     Functional Status Survey: Is the patient deaf or have difficulty hearing?: No Does the patient have difficulty seeing, even when wearing glasses/contacts?: No Does the patient have difficulty concentrating, remembering, or making decisions?: No Does the patient have difficulty walking or climbing stairs?: No Does the patient have difficulty dressing or bathing?: No Does the patient have difficulty doing errands alone such as visiting a doctor's office or shopping?: No    Assessment & Plan  1. Moderate major depression (HCC)  - ARIPiprazole (ABILIFY) 2 MG tablet; Take 1 tablet (2 mg total) by mouth daily.  Dispense: 30 tablet; Refill: 0 - hydrOXYzine (ATARAX/VISTARIL) 10 MG tablet; Take 1 tablet (10 mg total) by mouth 3 (three) times daily as needed.  Dispense: 30 tablet; Refill: 0  2. Colon cancer screening  cologuard  3. History of tobacco use  - CT CHEST LUNG CA SCREEN LOW DOSE W/O CM; Future  4. Chronic neck and back pain  - DULoxetine (CYMBALTA) 60 MG capsule; Take 1 capsule (60 mg total) by mouth daily.  Dispense: 90 capsule; Refill: 1  5. Chronic obstructive pulmonary disease, unspecified COPD type ([place])  - predniSONE (DELTASONE) 10 MG tablet; Take 1 tablet (10 mg total) by mouth daily with breakfast.  Dispense: 10 tablet; Refill: 0 - naproxen (NAPROSYN) 500 MG tablet; Take 1 tablet (500 mg total) by mouth 2 (two) times daily with a meal.  Dispense: 180 tablet; Refill: 1 -  fluticasone furoate-vilanterol (BREO ELLIPTA) 100-25 MCG/INH AEPB; Inhale 1 puff into the lungs daily.  Dispense: 180 each; Refill: 0  6. Outbursts of anger  - ARIPiprazole (ABILIFY) 2 MG tablet; Take 1 tablet (2 mg total) by mouth daily.  Dispense: 30 tablet; Refill: 0 - hydrOXYzine (ATARAX/VISTARIL) 10 MG tablet; Take 1 tablet (10 mg total) by mouth 3 (three) times daily as needed.  Dispense: 30 tablet; Refill: 0  7. Skin breakdown  - Ambulatory referral to Podiatry  8. Corn of toe  - Ambulatory referral to Podiatry   9. History of drug dependence/abuse ([place])  Doing well , denies any drugs of abuse, alcohol or tobacco use at this time

## 2018-12-01 ENCOUNTER — Telehealth: Payer: Self-pay | Admitting: *Deleted

## 2018-12-01 NOTE — Telephone Encounter (Signed)
error 

## 2018-12-01 NOTE — Telephone Encounter (Signed)
Received referral for low dose lung cancer screening CT scan. Message left at phone number listed in EMR for patient to call me back to facilitate scheduling scan.  

## 2018-12-06 ENCOUNTER — Telehealth: Payer: Self-pay | Admitting: *Deleted

## 2018-12-06 DIAGNOSIS — Z87891 Personal history of nicotine dependence: Secondary | ICD-10-CM

## 2018-12-06 DIAGNOSIS — Z122 Encounter for screening for malignant neoplasm of respiratory organs: Secondary | ICD-10-CM

## 2018-12-06 DIAGNOSIS — Z1211 Encounter for screening for malignant neoplasm of colon: Secondary | ICD-10-CM | POA: Diagnosis not present

## 2018-12-06 NOTE — Telephone Encounter (Signed)
Received referral for initial lung cancer screening scan. Contacted patient and obtained smoking history,(former, quit 2013, 01/02/12, 40 packyear) as well as answering questions related to screening process. Patient denies signs of lung cancer such as weight loss or hemoptysis. Patient denies comorbidity that would prevent curative treatment if lung cancer were found. Patient is scheduled for shared decision making visit and CT scan on 12/16/18 at 145pm.

## 2018-12-06 NOTE — Telephone Encounter (Signed)
Received referral for low dose lung cancer screening CT scan. Message left at phone number listed in EMR for patient to call me back to facilitate scheduling scan.  

## 2018-12-08 ENCOUNTER — Ambulatory Visit: Payer: Medicare Other | Admitting: Podiatry

## 2018-12-08 ENCOUNTER — Other Ambulatory Visit: Payer: Self-pay

## 2018-12-08 ENCOUNTER — Ambulatory Visit (INDEPENDENT_AMBULATORY_CARE_PROVIDER_SITE_OTHER): Payer: Medicare Other

## 2018-12-08 ENCOUNTER — Encounter: Payer: Self-pay | Admitting: Podiatry

## 2018-12-08 DIAGNOSIS — M722 Plantar fascial fibromatosis: Secondary | ICD-10-CM

## 2018-12-08 DIAGNOSIS — Q828 Other specified congenital malformations of skin: Secondary | ICD-10-CM

## 2018-12-08 DIAGNOSIS — M2041 Other hammer toe(s) (acquired), right foot: Secondary | ICD-10-CM

## 2018-12-08 DIAGNOSIS — L603 Nail dystrophy: Secondary | ICD-10-CM | POA: Diagnosis not present

## 2018-12-08 MED ORDER — TERBINAFINE HCL 250 MG PO TABS: 250.0000 mg | ORAL_TABLET | Freq: Every day | ORAL | 0 refills | Status: DC

## 2018-12-08 NOTE — Progress Notes (Signed)
Subjective:  Patient ID: Ronald Ellis, male    DOB: 1960-11-04,  MRN: [id number] HPI Chief Complaint  Patient presents with  . Toe Pain    5th toe right - callused, aching x 3 months, tried callus remover, shoes uncomfortable  . Nail Problem    Toenails - thick and dicsolored - "They were almost clear with the medication he gave me and it started to come back"  . Plantar Fasciitis    Follow up bilateral heels   "Sometimes still has pain"  . New Patient (Initial Visit)    Est pt 2018    58 y.o. male presents with the above complaint.   ROS: Denies fever chills nausea vomiting muscle aches pains calf pain back pain chest pain shortness of breath.  Past Medical History:  Diagnosis Date  . Chronic pain   . COPD (chronic obstructive pulmonary disease) ([place])    Past Surgical History:  Procedure Laterality Date  . BACK SURGERY  2015  . INGUINAL HERNIA REPAIR Bilateral    multiple times  . lumbar discetomy  2005  . NECK SURGERY  2016    Current Outpatient Medications:  .  acetaminophen (TYLENOL) 500 MG tablet, Take 500 mg by mouth every 6 (six) hours as needed., Disp: , Rfl:  .  albuterol (PROVENTIL HFA;VENTOLIN HFA) 108 (90 Base) MCG/ACT inhaler, Inhale 2 puffs into the lungs every 6 (six) hours as needed for wheezing or shortness of breath., Disp: 1 Inhaler, Rfl: 0 .  ARIPiprazole (ABILIFY) 2 MG tablet, Take 1 tablet (2 mg total) by mouth daily., Disp: 30 tablet, Rfl: 0 .  Ascorbic Acid (VITAMIN C PO), Take by mouth., Disp: , Rfl:  .  b complex vitamins tablet, Take 1 tablet by mouth daily., Disp: , Rfl:  .  BIOTIN PO, Take by mouth., Disp: , Rfl:  .  DULoxetine (CYMBALTA) 60 MG capsule, Take 1 capsule (60 mg total) by mouth daily., Disp: 90 capsule, Rfl: 1 .  fluticasone furoate-vilanterol (BREO ELLIPTA) 100-25 MCG/INH AEPB, Inhale 1 puff into the lungs daily., Disp: 180 each, Rfl: 0 .  hydrOXYzine (ATARAX/VISTARIL) 10 MG tablet, Take 1 tablet (10 mg total) by mouth 3 (three)  times daily as needed., Disp: 30 tablet, Rfl: 0 .  MAGNESIUM PO, Take by mouth., Disp: , Rfl:  .  mometasone (NASONEX) 50 MCG/ACT nasal spray, USE 2 SPRAYS IN EACH NOSTRIL DAILY, Disp: 17 g, Rfl: 2 .  naproxen (NAPROSYN) 500 MG tablet, Take 1 tablet (500 mg total) by mouth 2 (two) times daily with a meal., Disp: 180 tablet, Rfl: 1 .  predniSONE (DELTASONE) 10 MG tablet, Take 1 tablet (10 mg total) by mouth daily with breakfast., Disp: 10 tablet, Rfl: 0 .  terbinafine (LAMISIL) 250 MG tablet, Take 1 tablet (250 mg total) by mouth daily., Disp: 30 tablet, Rfl: 0 .  tiZANidine (ZANAFLEX) 4 MG tablet, Take 1 tablet (4 mg total) by mouth at bedtime., Disp: 90 tablet, Rfl: 1 .  traZODone (DESYREL) 50 MG tablet, TAKE 1/2 TO 1 TABLET BY MOUTH DAILY AT BEDTIME AS NEEDED FOR SLEEP, Disp: 30 tablet, Rfl: 0 .  VITAMIN A PO, Take 150 mg by mouth., Disp: , Rfl:   Allergies  Allergen Reactions  . Gluten Meal    Review of Systems Objective:  There were no vitals filed for this visit.  General: Well developed, nourished, in no acute distress, alert and oriented x3   Dermatological: Skin is warm, dry and supple bilateral. Nails x 10  are well maintained; remaining integument appears unremarkable at this time. There are no open sores, no preulcerative lesions, no rash or signs of infection present.  Vascular: Dorsalis Pedis artery and Posterior Tibial artery pedal pulses are 2/4 bilateral with immedate capillary fill time. Pedal hair growth present. No varicosities and no lower extremity edema present bilateral.   Neruologic: Grossly intact via light touch bilateral. Vibratory intact via tuning fork bilateral. Protective threshold with Semmes Wienstein monofilament intact to all pedal sites bilateral. Patellar and Achilles deep tendon reflexes 2+ bilateral. No Babinski or clonus noted bilateral.   Musculoskeletal: No gross boney pedal deformities bilateral. No pain, crepitus, or limitation noted with foot and  ankle range of motion bilateral. Muscular strength 5/5 in all groups tested bilateral.  Adductovarus rotated hammertoe deformity fifth right resulting in reactive hyper keratoma overlying the PIPJ.  Also has pain palpation medial calcaneal tubercles of the bilateral heels.  Gait: Unassisted, Nonantalgic.    Radiographs:  Radiographs taken today do not demonstrate any type of major osseous abnormalities.  Assessment & Plan:   Assessment: Plantar fasciitis bilateral.  Adductovarus rotated hammertoe deformity with corn.  Painful thickened toenails hallux left.  Discussed to be  Plan: Allergy pathology conservative surgical therapies at this point took samples of the nail to be sent for pathologic evaluation reinjected the bilateral heels with 20 mg Kenalog 5 mg Marcaine point maximal tenderness bilateral.  Also debrided reactive hyperkeratotic lesion overlying the fifth PIPJ.  Discussed appropriate shoe gear stretching exercise ice therapy sugar modifications.     Max T. [place], [place]

## 2018-12-10 ENCOUNTER — Ambulatory Visit: Payer: Self-pay | Admitting: Podiatry

## 2018-12-11 LAB — COLOGUARD: Cologuard: NEGATIVE

## 2018-12-15 ENCOUNTER — Encounter: Payer: Self-pay | Admitting: Oncology

## 2018-12-15 ENCOUNTER — Encounter: Payer: Self-pay | Admitting: Family Medicine

## 2018-12-16 ENCOUNTER — Inpatient Hospital Stay: Payer: Medicare Other | Attending: Oncology | Admitting: Nurse Practitioner

## 2018-12-16 ENCOUNTER — Ambulatory Visit
Admission: RE | Admit: 2018-12-16 | Discharge: 2018-12-16 | Disposition: A | Discharge status: Home / Self Care | Payer: Medicare Other | Source: Ambulatory Visit | Attending: Nurse Practitioner | Admitting: Nurse Practitioner

## 2018-12-16 ENCOUNTER — Other Ambulatory Visit: Payer: Self-pay

## 2018-12-16 DIAGNOSIS — Z87891 Personal history of nicotine dependence: Secondary | ICD-10-CM | POA: Insufficient documentation

## 2018-12-16 DIAGNOSIS — Z122 Encounter for screening for malignant neoplasm of respiratory organs: Secondary | ICD-10-CM | POA: Diagnosis not present

## 2018-12-16 NOTE — Progress Notes (Signed)
Virtual Visit via Video Enabled Telemedicine Note   I connected with Ronald Ellis on 12/16/18 at 1:45 PM EST by video enabled telemedicine visit and verified that I am speaking with the correct person using two identifiers.   I discussed the limitations, risks, security and privacy concerns of performing an evaluation and management service by telemedicine and the availability of in-person appointments. I also discussed with the patient that there may be a patient responsible charge related to this service. The patient expressed understanding and agreed to proceed.   Other persons participating in the visit and their role in the encounter: Burgess Estelle, RN- checking in patient & navigation  Patient's location: [place]  Provider's location: Clinic  Chief Complaint: Low Dose CT Screening  Patient agreed to evaluation by telemedicine to discuss shared decision making for consideration of low dose CT lung cancer screening.    In accordance with CMS guidelines, patient has met eligibility criteria including age, absence of signs or symptoms of lung cancer.  Social History   Tobacco Use  . Smoking status: Former Smoker    Packs/day: 1.00    Years: 40.00    Pack years: 40.00    Types: Cigarettes    Quit date: 01/02/2012    Years since quitting: 6.9  . Smokeless tobacco: Never Used  . Tobacco comment: currently just a couple cigars once a month   Substance Use Topics  . Alcohol use: Not Currently    Comment: quit 5-6 years ago, only beer - but glutten free diet now     A shared decision-making session was conducted prior to the performance of CT scan. This includes one or more decision aids, includes benefits and harms of screening, follow-up diagnostic testing, over-diagnosis, false positive rate, and total radiation exposure.   Counseling on the importance of adherence to annual lung cancer LDCT screening, impact of co-morbidities, and ability or willingness to undergo diagnosis  and treatment is imperative for compliance of the program.   Counseling on the importance of continued smoking cessation for former smokers; the importance of smoking cessation for current smokers, and information about tobacco cessation interventions have been given to patient including [facility] and [employer] [place] programs.   Written order for lung cancer screening with LDCT has been given to the patient and any and all questions have been answered to the best of my abilities.    Yearly follow up will be coordinated by Burgess Estelle, Thoracic Navigator.  I discussed the assessment and treatment plan with the patient. The patient was provided an opportunity to ask questions and all were answered. The patient agreed with the plan and demonstrated an understanding of the instructions.   The patient was advised to call back or seek an in-person evaluation if the symptoms worsen or if the condition fails to improve as anticipated.   I provided 15 minutes of face-to-face video visit time during this encounter, and > 50% was spent counseling as documented under my assessment & plan.   Beckey Rutter, DNP, AGNP-C [place] [facility] [phone] (work cell) [phone] (office)

## 2018-12-17 ENCOUNTER — Encounter: Payer: Self-pay | Admitting: Family Medicine

## 2018-12-17 DIAGNOSIS — I700 Atherosclerosis of aorta: Secondary | ICD-10-CM | POA: Insufficient documentation

## 2018-12-20 ENCOUNTER — Telehealth: Payer: Self-pay | Admitting: *Deleted

## 2018-12-20 NOTE — Telephone Encounter (Signed)
Notified patient of LDCT lung cancer screening program results with recommendation for 12 month follow up imaging. Also notified of incidental findings noted below and is encouraged to discuss further with PCP who will receive a copy of this note and/or the CT report. Patient verbalizes understanding.   IMPRESSION: 1. Lung-RADS 2S, benign appearance or behavior. Continue annual screening with low-dose chest CT without contrast in 12 months. 2. The "S" modifier above refers to potentially clinically significant non lung cancer related findings. Specifically, there is evidence of interstitial lung disease as detailed above. Outpatient referral to Pulmonology for further evaluation is recommended. 3. Mild diffuse bronchial wall thickening with mild centrilobular and moderate paraseptal emphysema; imaging findings suggestive of underlying COPD.  Aortic Atherosclerosis (ICD10-I70.0) and Emphysema (ICD10-J43.9).

## 2018-12-28 ENCOUNTER — Encounter: Payer: Self-pay | Admitting: Family Medicine

## 2018-12-28 ENCOUNTER — Other Ambulatory Visit: Payer: Self-pay

## 2018-12-28 ENCOUNTER — Ambulatory Visit (INDEPENDENT_AMBULATORY_CARE_PROVIDER_SITE_OTHER): Payer: Medicare Other | Admitting: Family Medicine

## 2018-12-28 VITALS — BP 118/82 | HR 84 | Temp 97.7°F | Resp 16 | Ht 73.0 in | Wt 196.6 lb

## 2018-12-28 DIAGNOSIS — F321 Major depressive disorder, single episode, moderate: Secondary | ICD-10-CM | POA: Diagnosis not present

## 2018-12-28 DIAGNOSIS — R454 Irritability and anger: Secondary | ICD-10-CM

## 2018-12-28 DIAGNOSIS — I700 Atherosclerosis of aorta: Secondary | ICD-10-CM

## 2018-12-28 MED ORDER — HYDROXYZINE HCL 10 MG PO TABS: 10.0000 mg | ORAL_TABLET | Freq: Three times a day (TID) | ORAL | 2 refills | Status: DC | PRN

## 2018-12-28 MED ORDER — ARIPIPRAZOLE 2 MG PO TABS: 2.0000 mg | ORAL_TABLET | Freq: Every day | ORAL | 0 refills | Status: DC

## 2018-12-28 NOTE — Progress Notes (Signed)
Name: Ronald Ellis   MRN: [id number]    DOB: [birth year]   Date:12/28/2018       Progress Note  Subjective  Chief Complaint  Chief Complaint  Patient presents with  . Follow-up    1 month F/U  . Outbursts of anger    States the Abilify and Hydroxyzine is really helping with taking the edge off and lowering his outburst. But states he is not sleeping at well and only got 30 pills of the hydroxyzine and would like to take it as night to help.    HPI  Atherosclerosis Aorta: on CT scan for lung cancer screen, he does not want to take statin therapy, not on aspirin because he is taking naproxen  Major Depression: took Lexapro in the past but did not work,used to be seasonal affective disorder before back injury, however his life changed significantly. He used to be very active and adventures ( used to scuba dive/rock climbing, riding dirt bikes...). He raised his children and grandchildren outdoors and now he cannot do any of that. He started on Duloxetine 60 mg  Spring 2019  and was  doing very well mood wise, however when he came in September he noticed he was getting agitated and having angry outburst, we added Ability 2 mg and hydroxizine up to TID and he is doing better , he states anger outburst are down. He noticed not sleeping , he wakes up moody and has to take hydroxyzine that he only got a 30 pills but instructed to take it TID, we will give him 90 pills to be able to take one at night   Patient Active Problem List   Diagnosis Date Noted  . Atherosclerosis of aorta ([place]) 12/17/2018  . Personal history of tobacco use, presenting hazards to health 12/16/2018  . History of drug dependence/abuse ([place]) 11/26/2018  . Celiac disease 02/05/2018  . COPD (chronic obstructive pulmonary disease) ([place]) 06/29/2017  . Right-sided low back pain with right-sided sciatica 12/05/2015  . Plantar fasciitis of right foot 10/15/2015  . Chronic neck and back pain 07/19/2015  . Fatigue 07/19/2015     Past Surgical History:  Procedure Laterality Date  . BACK SURGERY  2015  . INGUINAL HERNIA REPAIR Bilateral    multiple times  . lumbar discetomy  2005  . NECK SURGERY  2016    History reviewed. No pertinent family history.  Social History   Socioeconomic History  . Marital status: Soil scientist    Spouse name: [name]  . Number of children: 4  . Years of education: Not on file  . Highest education level: Some college, no degree  Occupational History  . 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 Financial resource strain: Not hard at all  . Food insecurity    Worry: Never true    Inability: Never true  . Transportation needs    Medical: No    Non-medical: No  Tobacco Use  . Smoking status: Former Smoker    Packs/day: 1.00    Years: 40.00    Pack years: 40.00    Types: Cigarettes    Quit date: 01/02/2012    Years since quitting: 6.9  . Smokeless tobacco: Never Used  . Tobacco comment: currently just a couple cigars once a month   Substance and Sexual Activity  . Alcohol use: Not Currently    Comment: quit 5-6 years ago, only beer - but glutten  free diet now  . Drug use: Yes    Types: Cocaine, Marijuana, Heroin, "Crack" cocaine, Other-see comments    Comment: meths in his 20's-30's  . Sexual activity: Yes    Partners: Female    Birth control/protection: Post-menopausal  Lifestyle  . Physical activity    Days per week: 0 days    Minutes per session: 0 min  . Stress: Not at all  Relationships  . Social Herbalist on phone: More than three times a week    Gets together: Never    Attends religious service: Never    Active member of club or organization: No    Attends meetings of clubs or organizations: Never    Relationship status: Living with partner  . Intimate partner violence    Fear of current or ex partner: No    Emotionally abused: No    Physically abused: No    Forced sexual  activity: No  Other Topics Concern  . Not on file  Social History Narrative   Approved for disability 06/2017 ( workman's comp back 2014) , secondary to neck and back injury      Current Outpatient Medications:  .  acetaminophen (TYLENOL) 500 MG tablet, Take 500 mg by mouth every 6 (six) hours as needed., Disp: , Rfl:  .  albuterol (PROVENTIL HFA;VENTOLIN HFA) 108 (90 Base) MCG/ACT inhaler, Inhale 2 puffs into the lungs every 6 (six) hours as needed for wheezing or shortness of breath., Disp: 1 Inhaler, Rfl: 0 .  ARIPiprazole (ABILIFY) 2 MG tablet, Take 1 tablet (2 mg total) by mouth daily., Disp: 30 tablet, Rfl: 0 .  Ascorbic Acid (VITAMIN C PO), Take by mouth., Disp: , Rfl:  .  b complex vitamins tablet, Take 1 tablet by mouth daily., Disp: , Rfl:  .  BIOTIN PO, Take by mouth., Disp: , Rfl:  .  DULoxetine (CYMBALTA) 60 MG capsule, Take 1 capsule (60 mg total) by mouth daily., Disp: 90 capsule, Rfl: 1 .  fluticasone furoate-vilanterol (BREO ELLIPTA) 100-25 MCG/INH AEPB, Inhale 1 puff into the lungs daily., Disp: 180 each, Rfl: 0 .  hydrOXYzine (ATARAX/VISTARIL) 10 MG tablet, Take 1 tablet (10 mg total) by mouth 3 (three) times daily as needed., Disp: 30 tablet, Rfl: 0 .  MAGNESIUM PO, Take by mouth., Disp: , Rfl:  .  mometasone (NASONEX) 50 MCG/ACT nasal spray, USE 2 SPRAYS IN EACH NOSTRIL DAILY, Disp: 17 g, Rfl: 2 .  naproxen (NAPROSYN) 500 MG tablet, Take 1 tablet (500 mg total) by mouth 2 (two) times daily with a meal., Disp: 180 tablet, Rfl: 1 .  terbinafine (LAMISIL) 250 MG tablet, Take 1 tablet (250 mg total) by mouth daily., Disp: 30 tablet, Rfl: 0 .  tiZANidine (ZANAFLEX) 4 MG tablet, Take 1 tablet (4 mg total) by mouth at bedtime., Disp: 90 tablet, Rfl: 1 .  traZODone (DESYREL) 50 MG tablet, TAKE 1/2 TO 1 TABLET BY MOUTH DAILY AT BEDTIME AS NEEDED FOR SLEEP, Disp: 30 tablet, Rfl: 0 .  VITAMIN A PO, Take 150 mg by mouth., Disp: , Rfl:  .  predniSONE (DELTASONE) 10 MG tablet, Take 1  tablet (10 mg total) by mouth daily with breakfast. (Patient not taking: Reported on 12/28/2018), Disp: 10 tablet, Rfl: 0  Allergies  Allergen Reactions  . Gluten Meal     I personally reviewed active problem list, medication list, allergies, family history, social history, health maintenance with the patient/caregiver today.   ROS  Ten systems  reviewed and is negative except as mentioned in HPI   Objective  Vitals:   12/28/18 1053  BP: 118/82  Pulse: 84  Resp: 16  Temp: 97.7 F (36.5 C)  TempSrc: Temporal  SpO2: 99%  Weight: 196 lb 9.6 oz (89.2 kg)  Height: 6' 1"  (1.854 m)    Body mass index is 25.94 kg/m.  Physical Exam  Constitutional: Patient appears well-developed and well-nourished. Obese No distress.  HEENT: head atraumatic, normocephalic, pupils equal and reactive to light Cardiovascular: Normal rate, regular rhythm and normal heart sounds.  No murmur heard. No BLE edema. Pulmonary/Chest: Effort normal and breath sounds normal. No respiratory distress. Abdominal: Soft.  There is no tenderness. Psychiatric: Patient has a normal mood and affect. behavior is normal. Judgment and thought content normal.  Recent Results (from the past 2160 hour(s))  Cologuard     Status: None   Collection Time: 12/06/18 12:00 AM  Result Value Ref Range   Cologuard Negative Negative     PHQ2/9: Depression screen [facility] 2/9 12/28/2018 11/26/2018 11/26/2018 05/25/2018 04/06/2018  Decreased Interest 1 1 0 0 0  Down, Depressed, Hopeless 0 1 0 0 0  PHQ - 2 Score 1 2 0 0 0  Altered sleeping 1 2 0 0 0  Tired, decreased energy 1 1 0 0 0  Change in appetite 1 2 0 0 0  Feeling bad or failure about yourself  0 0 0 0 0  Trouble concentrating 0 3 0 0 0  Moving slowly or fidgety/restless 0 1 0 0 0  Suicidal thoughts 0 0 0 0 0  PHQ-9 Score 4 11 0 0 0  Difficult doing work/chores Not difficult at all Very difficult - Not difficult at all Not difficult at all    phq 9 is positive  GAD 7 :  Generalized Anxiety Score 12/28/2018 11/26/2018 10/12/2017  Nervous, Anxious, on Edge 1 3 1   Control/stop worrying 2 1 0  Worry too much - different things 2 1 0  Trouble relaxing 2 1 1   Restless 1 2 1   Easily annoyed or irritable 3 3 1   Afraid - awful might happen 0 0 0  Total GAD 7 Score 11 11 4   Anxiety Difficulty Very difficult Very difficult -     Fall Risk: Fall Risk  12/28/2018 11/26/2018 05/25/2018 04/06/2018 02/05/2018  Falls in the past year? 1 1 0 0 0  Number falls in past yr: 0 0 0 0 0  Injury with Fall? 0 0 0 0 0     Functional Status Survey: Is the patient deaf or have difficulty hearing?: No Does the patient have difficulty seeing, even when wearing glasses/contacts?: No Does the patient have difficulty concentrating, remembering, or making decisions?: No Does the patient have difficulty walking or climbing stairs?: No Does the patient have difficulty dressing or bathing?: No Does the patient have difficulty doing errands alone such as visiting a doctor's office or shopping?: No    Assessment & Plan  1. Moderate major depression (HCC)  - hydrOXYzine (ATARAX/VISTARIL) 10 MG tablet; Take 1 tablet (10 mg total) by mouth 3 (three) times daily as needed.  Dispense: 90 tablet; Refill: 2 - ARIPiprazole (ABILIFY) 2 MG tablet; Take 1 tablet (2 mg total) by mouth daily.  Dispense: 90 tablet; Refill: 0  2. Outbursts of anger  - hydrOXYzine (ATARAX/VISTARIL) 10 MG tablet; Take 1 tablet (10 mg total) by mouth 3 (three) times daily as needed.  Dispense: 90 tablet; Refill: 2 - ARIPiprazole (  ABILIFY) 2 MG tablet; Take 1 tablet (2 mg total) by mouth daily.  Dispense: 90 tablet; Refill: 0   3. Atherosclerosis of aorta [facility])  He will think about statins, he states he will cut down on aspirin

## 2019-01-04 ENCOUNTER — Telehealth: Payer: Self-pay | Admitting: Family Medicine

## 2019-01-04 NOTE — Telephone Encounter (Signed)
Pt called and stated that he had an appointment 12/28/18 for his lungs. Pt states that he is not getting better and would like to know if prednisone could be called in. Please advise

## 2019-01-05 ENCOUNTER — Ambulatory Visit: Payer: Medicare Other | Admitting: Podiatry

## 2019-01-06 NOTE — Telephone Encounter (Signed)
Left message from Dr. Ancil Boozer recommendations of him seeing Pulmonologist since he had a round of Prednisone less than 2 months ago.

## 2019-01-14 ENCOUNTER — Ambulatory Visit (INDEPENDENT_AMBULATORY_CARE_PROVIDER_SITE_OTHER): Payer: Medicare Other

## 2019-01-14 VITALS — BP 141/80 | HR 82 | Ht 73.0 in | Wt 200.0 lb

## 2019-01-14 DIAGNOSIS — Z0000 Encounter for general adult medical examination without abnormal findings: Secondary | ICD-10-CM | POA: Diagnosis not present

## 2019-01-14 NOTE — Progress Notes (Signed)
Subjective:   Ronald Ellis is a 58 y.o. male who presents for an Initial Medicare Annual Wellness Visit.  Virtual Visit via Telephone Note  I connected with Ronald Ellis on 01/14/19 at 10:00 AM EST by telephone and verified that I am speaking with the correct person using two identifiers.  Medicare Annual Wellness visit completed telephonically due to Covid-19 pandemic.   Location: Patient: home Provider: office   I discussed the limitations, risks, security and privacy concerns of performing an evaluation and management service by telephone and the availability of in person appointments. The patient expressed understanding and agreed to proceed.  Some vital signs may be absent or patient reported.   [name], LPN    Review of Systems   Cardiac Risk Factors include: advanced age (>61mn, >>63women);male gender    Objective:    Today's Vitals   01/14/19 1010 01/14/19 1013  BP: (!) 141/80   Pulse: 82   SpO2: 97%   Weight: 200 lb (90.7 kg)   Height: 6' 1"  (1.854 m)   PainSc:  5    Body mass index is 26.39 kg/m.  Advanced Directives 01/14/2019 10/01/2016 02/15/2016 01/16/2016 12/05/2015 10/15/2015 07/19/2015  Does Patient Have a Medical Advance Directive? No No No No No No No  Would patient like information on creating a medical advance directive? Yes (MAU/Ambulatory/Procedural Areas - Information given) - - No - patient declined information No - patient declined information No - patient declined information No - patient declined information    Current Medications (verified) Outpatient Encounter Medications as of 01/14/2019  Medication Sig  . acetaminophen (TYLENOL) 500 MG tablet Take 500 mg by mouth every 6 (six) hours as needed.  .Marland Kitchenalbuterol (PROVENTIL HFA;VENTOLIN HFA) 108 (90 Base) MCG/ACT inhaler Inhale 2 puffs into the lungs every 6 (six) hours as needed for wheezing or shortness of breath.  . ARIPiprazole (ABILIFY) 2 MG tablet Take 1 tablet (2 mg total) by mouth  daily.  . Ascorbic Acid (VITAMIN C PO) Take by mouth.  .Marland Kitchenb complex vitamins tablet Take 1 tablet by mouth daily.  . DULoxetine (CYMBALTA) 60 MG capsule Take 1 capsule (60 mg total) by mouth daily.  . fluticasone furoate-vilanterol (BREO ELLIPTA) 100-25 MCG/INH AEPB Inhale 1 puff into the lungs daily.  . hydrOXYzine (ATARAX/VISTARIL) 10 MG tablet Take 1 tablet (10 mg total) by mouth 3 (three) times daily as needed.  .Marland KitchenMAGNESIUM PO Take by mouth.  . mometasone (NASONEX) 50 MCG/ACT nasal spray USE 2 SPRAYS IN EACH NOSTRIL DAILY  . naproxen (NAPROSYN) 500 MG tablet Take 1 tablet (500 mg total) by mouth 2 (two) times daily with a meal.  . tiZANidine (ZANAFLEX) 4 MG tablet Take 1 tablet (4 mg total) by mouth at bedtime.  . traZODone (DESYREL) 50 MG tablet TAKE 1/2 TO 1 TABLET BY MOUTH DAILY AT BEDTIME AS NEEDED FOR SLEEP (Patient taking differently: Pt taking 2 tablets qhs)  . BIOTIN PO Take by mouth.  . terbinafine (LAMISIL) 250 MG tablet Take 1 tablet (250 mg total) by mouth daily. (Patient not taking: Reported on 01/14/2019)  . [DISCONTINUED] VITAMIN A PO Take 150 mg by mouth.   No facility-administered encounter medications on file as of 01/14/2019.     Allergies (verified) Gluten meal   History: Past Medical History:  Diagnosis Date  . Chronic pain   . [place]    Past Surgical History:  Procedure Laterality Date  . BACK SURGERY  2015  .  INGUINAL HERNIA REPAIR Bilateral    multiple times  . lumbar discetomy  2005  . NECK SURGERY  2016   History reviewed. No pertinent family history. Social History   Socioeconomic History  . Marital status: Soil scientist    Spouse name: Ronald Ellis  . Number of children: 4  . Years of education: Not on file  . Highest education level: Some college, no degree  Occupational History  . 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 Financial  resource strain: Not hard at all  . Food insecurity    Worry: Never true    Inability: Never true  . Transportation needs    Medical: No    Non-medical: No  Tobacco Use  . Smoking status: Former Smoker    Packs/day: 1.00    Years: 40.00    Pack years: 40.00    Types: Cigarettes    Quit date: 01/02/2012    Years since quitting: 7.0  . Smokeless tobacco: Never Used  . Tobacco comment: currently just a couple cigars once a month   Substance and Sexual Activity  . Alcohol use: Not Currently    Comment: quit 5-6 years ago, only beer - but glutten free diet now  . Drug use: Not Currently    Types: Cocaine, Marijuana, Heroin, "Crack" cocaine, Other-see comments    Comment: meths in his 20's-30's  . Sexual activity: Yes    Partners: Female  Lifestyle  . Physical activity    Days per week: 1 day    Minutes per session: 30 min  . Stress: Only a little  Relationships  . Social Herbalist on phone: More than three times a week    Gets together: Never    Attends religious service: Never    Active member of club or organization: No    Attends meetings of clubs or organizations: Never    Relationship status: Living with partner  Other Topics Concern  . Not on file  Social History Narrative   Approved for disability 06/2017 ( workman's comp back 2014) , secondary to neck and back injury    Tobacco Counseling Counseling given: Not Answered Comment: currently just a couple cigars once a month    Clinical Intake:  Pre-visit preparation completed: Yes  Pain : 0-10 Pain Score: 5  Pain Type: Chronic pain Pain Location: Back(neck, shoulders) Pain Orientation: Lower Pain Descriptors / Indicators: Aching, Discomfort, Sore Pain Onset: More than a month ago Pain Frequency: Constant     BMI - recorded: 26.39 Nutritional Status: BMI 25 -29 Overweight Nutritional Risks: None Diabetes: No  How often do you need to have someone help you when you read instructions,  pamphlets, or other written materials from your doctor or pharmacy?: 1 - Never  Interpreter Needed?: No  Information entered by :: [name] LPN  Activities of Daily Living In your present state of health, do you have any difficulty performing the following activities: 01/14/2019 12/28/2018  Hearing? N N  Comment declines hearing aids -  Vision? N N  Difficulty concentrating or making decisions? N N  Walking or climbing stairs? Y N  Comment - -  Dressing or bathing? N N  Doing errands, shopping? N N  Preparing Food and eating ? N -  Using the Toilet? N -  In the past six months, have you accidently leaked urine? N -  Do you have problems with loss  of bowel control? N -  Managing your Medications? N -  Managing your Finances? N -  Housekeeping or managing your Housekeeping? N -  Some recent data might be hidden     Immunizations and Health Maintenance Immunization History  Administered Date(s) Administered  . Pneumococcal Polysaccharide-23 06/29/2017  . Tdap 11/26/2018   There are no preventive care reminders to display for this patient.  Patient Care Team: Steele Sizer, MD as PCP - General (Family Medicine)  Indicate any recent Medical Services you may have received from other than [facility] providers in the past year (date may be approximate).    Assessment:   This is a routine wellness examination for [place].  Hearing/Vision screen  Hearing Screening   125Hz  250Hz  500Hz  1000Hz  2000Hz  3000Hz  4000Hz  6000Hz  8000Hz   Right ear:           Left ear:           Comments: Pt denies hearing difficulty  Vision Screening Comments: Past due for eye exam. Not established with provider. Declines referral.   Dietary issues and exercise activities discussed: Current Exercise Habits: Home exercise routine, Type of exercise: walking, Time (Minutes): 30, Frequency (Times/Week): 1, Weekly Exercise (Minutes/Week): 30, Intensity: Mild, Exercise limited by: orthopedic  condition(s);respiratory conditions(s)  Goals    . Weight (lb) < 190 lb (86.2 kg)     Pt would like to lose 10 lbs over the next year with diet and exercise      Depression Screen PHQ 2/9 Scores 01/14/2019 12/28/2018 11/26/2018 11/26/2018  PHQ - 2 Score 2 1 2  0  PHQ- 9 Score 5 4 11  0    Fall Risk Fall Risk  01/14/2019 12/28/2018 11/26/2018 05/25/2018 04/06/2018  Falls in the past year? 1 1 1  0 0  Number falls in past yr: 1 0 0 0 0  Injury with Fall? 0 0 0 0 0  Risk for fall due to : Impaired balance/gait;History of fall(s) - - - -  Follow up Falls prevention discussed - - - -   FALL RISK PREVENTION PERTAINING TO THE HOME:  Any stairs in or around the home? Yes  If so, do they handrails? Yes   Home free of loose throw rugs in walkways, pet beds, electrical cords, etc? Yes Adequate lighting in your home to reduce risk of falls? Yes   ASSISTIVE DEVICES UTILIZED TO PREVENT FALLS:  Life alert? No  Use of a cane, walker or w/c? Yes  Grab bars in the bathroom? No  Shower chair or bench in shower? No  Elevated toilet seat or a handicapped toilet? No   DME ORDERS:  DME order needed?  No   TIMED UP AND GO:  Was the test performed? No . Telephonic visit.   Education: Fall risk prevention has been discussed.  Intervention(s) required? No   Cognitive Function: Pt declined 6CIT for 2020 AWV        Screening Tests Health Maintenance  Topic Date Due  . INFLUENZA VACCINE  06/01/2019 (Originally 10/02/2018)  . Fecal DNA (Cologuard)  12/05/2021  . TETANUS/TDAP  11/25/2028  . Hepatitis C Screening  Completed  . HIV Screening  Completed    Qualifies for Shingles Vaccine? Yes . Due for Shingrix. Education has been provided regarding the importance of this vaccine. Pt has been advised to call insurance company to determine out of pocket expense. Advised may also receive vaccine at local pharmacy or Health Dept. Verbalized acceptance and understanding.  Tdap: Up to date  Flu  Vaccine: Due for Flu vaccine. Does the patient want to receive this vaccine today?  No . Education has been provided regarding the importance of this vaccine but still declined. Advised may receive this vaccine at local pharmacy or Health Dept. Aware to provide a copy of the vaccination record if obtained from local pharmacy or Health Dept. Verbalized acceptance and understanding.  Pneumococcal Vaccine: Due for Pneumococcal vaccine. Does the patient want to receive this vaccine today?  No . Education has been provided regarding the importance of this vaccine but still declined. Advised may receive this vaccine at local pharmacy or Health Dept. Awar/e to provide a copy of the vaccination record if obtained from local pharmacy or Health Dept. Verbalized acceptance and understanding.  Cancer Screenings:  Colorectal Screening: Cologuard completed 12/06/18. Repeat every 3 years;   Lung Cancer Screening: (Low Dose CT Chest recommended if Age 77-80 years, 30 pack-year currently smoking OR have quit w/in 15years.) does qualify. Completed 12/16/18.  Additional Screening:  Hepatitis C Screening: does qualify; Completed   Vision Screening: Recommended annual ophthalmology exams for early detection of glaucoma and other disorders of the eye. Is the patient up to date with their annual eye exam?  No  Who is the provider or what is the name of the office in which the pt attends annual eye exams? Not established If pt is not established with a provider, would they like to be referred to a provider to establish care? No .   Dental Screening: Recommended annual dental exams for proper oral hygiene  Community Resource Referral:  CRR required this visit?  No      Plan:    I have personally reviewed and addressed the Medicare Annual Wellness questionnaire and have noted the following in the patient's chart:  A. Medical and social history B. Use of alcohol, tobacco or illicit drugs  C. Current medications  and supplements D. Functional ability and status E.  Nutritional status F.  Physical activity G. Advance directives H. List of other physicians I.  Hospitalizations, surgeries, and ER visits in previous 12 months J.  [place] such as hearing and vision if needed, cognitive and depression L. Referrals and appointments   In addition, I have reviewed and discussed with patient certain preventive protocols, quality metrics, and best practice recommendations. A written personalized care plan for preventive services as well as general preventive health recommendations were provided to patient.   Signed,  [name], LPN Nurse Health Advisor   Nurse Notes: none

## 2019-01-14 NOTE — Patient Instructions (Signed)
[name] , Thank you for taking time to come for your Medicare Wellness Visit. I appreciate your ongoing commitment to your health goals. Please review the following plan we discussed and let me know if I can assist you in the future.   Screening recommendations/referrals: Colonoscopy: Cologuard done 12/06/18. Repeat in 2023 Recommended yearly ophthalmology/optometry visit for glaucoma screening and checkup Recommended yearly dental visit for hygiene and checkup  Vaccinations: Influenza vaccine: due Pneumococcal vaccine: done 06/29/17 Tdap vaccine: done 11/26/18 Shingles vaccine: Shingrix discussed. Please contact your pharmacy for coverage information.   Advanced directives: Advance directive discussed with you today. I have provided a copy for you to complete at home and have notarized. Once this is complete please bring a copy in to our office so we can scan it into your chart.  Conditions/risks identified: Recommend healthy eating and physical activity for desired weight loss.   Next appointment: Please follow up in one year for your Medicare Annual Wellness visit.    Preventive Care 40-64 Years, Male Preventive care refers to lifestyle choices and visits with your health care provider that can promote health and wellness. What does preventive care include?  A yearly physical exam. This is also called an annual well check.  Dental exams once or twice a year.  Routine eye exams. Ask your health care provider how often you should have your eyes checked.  Personal lifestyle choices, including:  Daily care of your teeth and gums.  Regular physical activity.  Eating a healthy diet.  Avoiding tobacco and drug use.  Limiting alcohol use.  Practicing safe sex.  Taking low-dose aspirin every day starting at age 88. What happens during an annual well check? The services and screenings done by your health care provider during your annual well check will depend on your age,  overall health, lifestyle risk factors, and family history of disease. Counseling  Your health care provider may ask you questions about your:  Alcohol use.  Tobacco use.  Drug use.  Emotional well-being.  Home and relationship well-being.  Sexual activity.  Eating habits.  Work and work Statistician. Screening  You may have the following tests or measurements:  Height, weight, and BMI.  Blood pressure.  Lipid and cholesterol levels. These may be checked every 5 years, or more frequently if you are over 26 years old.  Skin check.  Lung cancer screening. You may have this screening every year starting at age 82 if you have a 30-pack-year history of smoking and currently smoke or have quit within the past 15 years.  Fecal occult blood test (FOBT) of the stool. You may have this test every year starting at age 57.  Flexible sigmoidoscopy or colonoscopy. You may have a sigmoidoscopy every 5 years or a colonoscopy every 10 years starting at age 74.  Prostate cancer screening. Recommendations will vary depending on your family history and other risks.  Hepatitis C blood test.  Hepatitis B blood test.  Sexually transmitted disease (STD) testing.  Diabetes screening. This is done by checking your blood sugar (glucose) after you have not eaten for a while (fasting). You may have this done every 1-3 years. Discuss your test results, treatment options, and if necessary, the need for more tests with your health care provider. Vaccines  Your health care provider may recommend certain vaccines, such as:  Influenza vaccine. This is recommended every year.  Tetanus, diphtheria, and acellular pertussis (Tdap, Td) vaccine. You may need a Td booster every 10 years.  Zoster vaccine. You may need this after age 25.  Pneumococcal 13-valent conjugate (PCV13) vaccine. You may need this if you have certain conditions and have not been vaccinated.  Pneumococcal polysaccharide (PPSV23)  vaccine. You may need one or two doses if you smoke cigarettes or if you have certain conditions. Talk to your health care provider about which screenings and vaccines you need and how often you need them. This information is not intended to replace advice given to you by your health care provider. Make sure you discuss any questions you have with your health care provider. Document Released: 03/16/2015 Document Revised: 11/07/2015 Document Reviewed: 12/19/2014 Elsevier Interactive Patient Education  2017 [place] Prevention in the Home Falls can cause injuries. They can happen to people of all ages. There are many things you can do to make your home safe and to help prevent falls. What can I do on the outside of my home?  Regularly fix the edges of walkways and driveways and fix any cracks.  Remove anything that might make you trip as you walk through a door, such as a raised step or threshold.  Trim any bushes or trees on the path to your home.  Use bright outdoor lighting.  Clear any walking paths of anything that might make someone trip, such as rocks or tools.  Regularly check to see if handrails are loose or broken. Make sure that both sides of any steps have handrails.  Any raised decks and porches should have guardrails on the edges.  Have any leaves, snow, or ice cleared regularly.  Use sand or salt on walking paths during winter.  Clean up any spills in your garage right away. This includes oil or grease spills. What can I do in the bathroom?  Use night lights.  Install grab bars by the toilet and in the tub and shower. Do not use towel bars as grab bars.  Use non-skid mats or decals in the tub or shower.  If you need to sit down in the shower, use a plastic, non-slip stool.  Keep the floor dry. Clean up any water that spills on the floor as soon as it happens.  Remove soap buildup in the tub or shower regularly.  Attach bath mats securely with  double-sided non-slip rug tape.  Do not have throw rugs and other things on the floor that can make you trip. What can I do in the bedroom?  Use night lights.  Make sure that you have a light by your bed that is easy to reach.  Do not use any sheets or blankets that are too big for your bed. They should not hang down onto the floor.  Have a firm chair that has side arms. You can use this for support while you get dressed.  Do not have throw rugs and other things on the floor that can make you trip. What can I do in the kitchen?  Clean up any spills right away.  Avoid walking on wet floors.  Keep items that you use a lot in easy-to-reach places.  If you need to reach something above you, use a strong step stool that has a grab bar.  Keep electrical cords out of the way.  Do not use floor polish or wax that makes floors slippery. If you must use wax, use non-skid floor wax.  Do not have throw rugs and other things on the floor that can make you trip. What can I do with  my stairs?  Do not leave any items on the stairs.  Make sure that there are handrails on both sides of the stairs and use them. Fix handrails that are broken or loose. Make sure that handrails are as long as the stairways.  Check any carpeting to make sure that it is firmly attached to the stairs. Fix any carpet that is loose or worn.  Avoid having throw rugs at the top or bottom of the stairs. If you do have throw rugs, attach them to the floor with carpet tape.  Make sure that you have a light switch at the top of the stairs and the bottom of the stairs. If you do not have them, ask someone to add them for you. What else can I do to help prevent falls?  Wear shoes that:  Do not have high heels.  Have rubber bottoms.  Are comfortable and fit you well.  Are closed at the toe. Do not wear sandals.  If you use a stepladder:  Make sure that it is fully opened. Do not climb a closed stepladder.  Make  sure that both sides of the stepladder are locked into place.  Ask someone to hold it for you, if possible.  Clearly mark and make sure that you can see:  Any grab bars or handrails.  First and last steps.  Where the edge of each step is.  Use tools that help you move around (mobility aids) if they are needed. These include:  Canes.  Walkers.  Scooters.  Crutches.  Turn on the lights when you go into a dark area. Replace any light bulbs as soon as they burn out.  Set up your furniture so you have a clear path. Avoid moving your furniture around.  If any of your floors are uneven, fix them.  If there are any pets around you, be aware of where they are.  Review your medicines with your doctor. Some medicines can make you feel dizzy. This can increase your chance of falling. Ask your doctor what other things that you can do to help prevent falls. This information is not intended to replace advice given to you by your health care provider. Make sure you discuss any questions you have with your health care provider. Document Released: 12/14/2008 Document Revised: 07/26/2015 Document Reviewed: 03/24/2014 Elsevier Interactive Patient Education  2017 [employer].

## 2019-02-07 ENCOUNTER — Encounter

## 2019-02-07 ENCOUNTER — Ambulatory Visit: Payer: Medicare Other | Admitting: Podiatry

## 2019-03-09 ENCOUNTER — Encounter: Payer: Self-pay | Admitting: Podiatry

## 2019-03-09 ENCOUNTER — Ambulatory Visit: Payer: Medicare Other | Admitting: Podiatry

## 2019-03-09 ENCOUNTER — Other Ambulatory Visit: Payer: Self-pay

## 2019-03-09 DIAGNOSIS — L603 Nail dystrophy: Secondary | ICD-10-CM

## 2019-03-09 DIAGNOSIS — M722 Plantar fascial fibromatosis: Secondary | ICD-10-CM

## 2019-03-09 MED ORDER — TERBINAFINE HCL 250 MG PO TABS: 250.0000 mg | ORAL_TABLET | Freq: Every day | ORAL | 0 refills | Status: DC

## 2019-03-09 MED ORDER — METHYLPREDNISOLONE 4 MG PO TBPK: ORAL_TABLET | ORAL | 0 refills | Status: DC

## 2019-03-09 NOTE — Progress Notes (Signed)
Ronald Ellis presents today for follow-up of his onychomycosis and his bilateral plantar fasciitis states that they they need new shots.  Objective: Vital signs are stable alert and oriented x3.  Pulses are palpable.  Has pain palpation medial calcaneal tubercles bilaterally that he was doing very good previously.  His first 30 days of pills no problem taking his Lamisil.  Assessment: Recurrent plantar fasciitis.  Long-term therapy with use of Lamisil.  Plan: Go ahead and start him another 90 days of Lamisil.  Am also going to reinject his bilateral heels 20 mg Kenalog 5 mg Marcaine.  Tolerated procedure well without complications follow-up with me in 1 month

## 2019-03-14 ENCOUNTER — Other Ambulatory Visit: Payer: Self-pay | Admitting: Family Medicine

## 2019-03-14 ENCOUNTER — Telehealth: Payer: Self-pay

## 2019-03-14 DIAGNOSIS — J441 Chronic obstructive pulmonary disease with (acute) exacerbation: Secondary | ICD-10-CM

## 2019-03-14 DIAGNOSIS — R05 Cough: Secondary | ICD-10-CM

## 2019-03-14 DIAGNOSIS — R059 Cough, unspecified: Secondary | ICD-10-CM

## 2019-03-14 MED ORDER — BENZONATATE 100 MG PO CAPS: 100.0000 mg | ORAL_CAPSULE | Freq: Two times a day (BID) | ORAL | 0 refills | Status: DC | PRN

## 2019-03-14 NOTE — Telephone Encounter (Signed)
Patient notified cough medication was sent in to his pharmacy. Also states he was checked for covid and was negative.

## 2019-03-14 NOTE — Telephone Encounter (Signed)
Copied from [place] [id number]. Topic: General - Other >> Mar 11, 2019  8:52 AM Burchel, Abbi R wrote: Reason for CRM: Pt called to try to move his appt up to sooner date, but there is no availability.  Pt has persistent cough and would like to know if Dr Ancil Boozer would consider calling in a rx for tessalon pearls for him.  Please advise

## 2019-03-17 DIAGNOSIS — Z03818 Encounter for observation for suspected exposure to other biological agents ruled out: Secondary | ICD-10-CM | POA: Diagnosis not present

## 2019-03-17 DIAGNOSIS — R05 Cough: Secondary | ICD-10-CM | POA: Diagnosis not present

## 2019-03-30 ENCOUNTER — Ambulatory Visit (INDEPENDENT_AMBULATORY_CARE_PROVIDER_SITE_OTHER): Payer: Medicare Other | Admitting: Family Medicine

## 2019-03-30 ENCOUNTER — Encounter: Payer: Self-pay | Admitting: Family Medicine

## 2019-03-30 VITALS — BP 126/80 | HR 82 | Ht 73.0 in | Wt 205.0 lb

## 2019-03-30 DIAGNOSIS — M542 Cervicalgia: Secondary | ICD-10-CM | POA: Diagnosis not present

## 2019-03-30 DIAGNOSIS — R454 Irritability and anger: Secondary | ICD-10-CM | POA: Diagnosis not present

## 2019-03-30 DIAGNOSIS — G8929 Other chronic pain: Secondary | ICD-10-CM

## 2019-03-30 DIAGNOSIS — G4700 Insomnia, unspecified: Secondary | ICD-10-CM

## 2019-03-30 DIAGNOSIS — J441 Chronic obstructive pulmonary disease with (acute) exacerbation: Secondary | ICD-10-CM | POA: Diagnosis not present

## 2019-03-30 DIAGNOSIS — J449 Chronic obstructive pulmonary disease, unspecified: Secondary | ICD-10-CM

## 2019-03-30 DIAGNOSIS — F320 Major depressive disorder, single episode, mild: Secondary | ICD-10-CM

## 2019-03-30 DIAGNOSIS — F321 Major depressive disorder, single episode, moderate: Secondary | ICD-10-CM | POA: Diagnosis not present

## 2019-03-30 DIAGNOSIS — G43011 Migraine without aura, intractable, with status migrainosus: Secondary | ICD-10-CM

## 2019-03-30 DIAGNOSIS — I700 Atherosclerosis of aorta: Secondary | ICD-10-CM

## 2019-03-30 DIAGNOSIS — M549 Dorsalgia, unspecified: Secondary | ICD-10-CM

## 2019-03-30 DIAGNOSIS — F338 Other recurrent depressive disorders: Secondary | ICD-10-CM

## 2019-03-30 MED ORDER — BREO ELLIPTA 100-25 MCG/INH IN AEPB: 1.0000 | INHALATION_SPRAY | Freq: Every day | RESPIRATORY_TRACT | 0 refills | Status: DC

## 2019-03-30 MED ORDER — DULOXETINE HCL 60 MG PO CPEP: 60.0000 mg | ORAL_CAPSULE | Freq: Every day | ORAL | 1 refills | Status: DC

## 2019-03-30 MED ORDER — ARIPIPRAZOLE 5 MG PO TABS: 5.0000 mg | ORAL_TABLET | Freq: Every day | ORAL | 0 refills | Status: DC

## 2019-03-30 MED ORDER — RIZATRIPTAN BENZOATE 10 MG PO TABS: 10.0000 mg | ORAL_TABLET | ORAL | 0 refills | Status: DC | PRN

## 2019-03-30 MED ORDER — PREDNISONE 20 MG PO TABS: 20.0000 mg | ORAL_TABLET | Freq: Two times a day (BID) | ORAL | 0 refills | Status: DC

## 2019-03-30 MED ORDER — PREGABALIN 50 MG PO CAPS: 50.0000 mg | ORAL_CAPSULE | Freq: Three times a day (TID) | ORAL | 0 refills | Status: DC

## 2019-03-30 MED ORDER — SPIRIVA HANDIHALER 18 MCG IN CAPS: 18.0000 ug | ORAL_CAPSULE | Freq: Every day | RESPIRATORY_TRACT | 0 refills | Status: DC

## 2019-03-30 MED ORDER — FLUTICASONE PROPIONATE 50 MCG/ACT NA SUSP: 2.0000 | Freq: Every day | NASAL | 6 refills | Status: DC

## 2019-03-30 NOTE — Progress Notes (Signed)
Name: Ronald Ellis   MRN: [id number]    DOB: [birth year]   Date:03/30/2019       Progress Note  Subjective  Chief Complaint  Chief Complaint  Patient presents with  . Medication Refill  . COPD    3 COVID test all negative- has been giving him a time.  . Depression    Would like to discuss increasing the Abilify he has been taking 2 and has been helping control his symptoms  . Pain  . Hyperglycemia  . Atherosclerosis of aorta  . Fatigue    He wants to sleep all the time    I connected with  Ronald Ellis  on 03/30/19 at 10:40 AM EST by a video enabled telemedicine application and verified that I am speaking with the correct person using two identifiers.  I discussed the limitations of evaluation and management by telemedicine and the availability of in person appointments. The patient expressed understanding and agreed to proceed. Staff also discussed with the patient that there may be a patient responsible charge related to this service. Patient Location: at home  Provider Location: [facility]   HPI  Atherosclerosis Aorta: on CT scan for lung cancer screen, he does not want to take statin therapy, not on aspirin because he is taking naproxen, he is eating healthier, cutting down on gluten  Major Depression: took Lexapro in the past but did not work,used to be seasonal affective disorder before back injury, however his life changed significantly. He used to be very active and adventures ( used to scuba dive/rock climbing, riding dirt bikes...). He raised his children and grandchildren outdoors and now he cannot do any of that. He started on Duloxetine 60 mg Spring 2019and wasdoing very well mood wise, however when he came in September he noticed he was getting agitated and having angry outburst, we added Ability 2 mg and hydroxizine up to TID prn, but he states once he bumped the dose of Abilify to 4 mg he noticed improvement of his symptoms. We will change to 5 mg.  He states he is worse because he is in pain and not feeling well due to COPD flare  Chronic pain:he has a long history of neck and lower back pain, symptoms started after a work related injury in 2014. He did not have insurance, but finally under disability2019He states muscle relaxer seems to helps with muscle spasmsand only taking prn, he still has tizanidine at home.He has a long history of drug abuse and even though he used to take hydrocodone ( given by his previous PCP), he is now only on Tylenol about 2000 mg per day , naproxen at lunch and states duloxetine helps a little , pain is still daily but down from 6/10 to average 5-6/10. He states his back pain is mostly with position shifting, but he also has pain and numbness on hands since neck surgery and knees intermittently   COPD :Four flares in 2020, he states he developed symptoms of chest congestion/feels tight  for the past few weeks. He states cough is much worse especially over the past week  and sputum is yellow and white. He did not like Trelegy and is back on Breo and over the past taking and tessalon perles . He states he has been using Breo twice daily lately, explained not safe, we will add Spiriva. He states he is feeling exhausted at the end of the day.   Headaches:  he takes tylenol daily to control his  back pain, he states that over the past few weeks he has noticed that he wakes up with sharp headache, usually above left eye. He has a history of migraine headaches many years ago. He states headaches has been constant and not better with tylenol . He is also willing to try maxalta and we will try lyrica at night to see if prevents episodes  Hyperglycemia:last hgbA1C was normal, no polyphagia, polyuria or polydipsia  Dyslipidemia:he has a very low HDL, LDL not very high, no significant family history of heart disease. He eats well but not able to be as active.he has atherosclerosis of aorta and even though ASCVD is low,  statin therapy is recommended   The 10-year ASCVD risk score Mikey Bussing DC Jr., et al., 2013) is: 6.4%   Values used to calculate the score:     Age: 59 years     Sex: Male     Is Non-Hispanic African American: No     Diabetic: No     Tobacco smoker: No     Systolic Blood Pressure: 099 mmHg     Is BP treated: No     HDL Cholesterol: 46 mg/dL     Total Cholesterol: 185 mg/dL  Snoring: loud , also has pauses during sleep, advised sleep study, he states he will go if covered by insurance , he states he would never wear a CPAP, therefore we will not schedule it, discussed tenis ball on his back. Unchanged    Patient Active Problem List   Diagnosis Date Noted  . Atherosclerosis of aorta ([place]) 12/17/2018  . Personal history of tobacco use, presenting hazards to health 12/16/2018  . History of drug dependence/abuse ([place]) 11/26/2018  . Celiac disease 02/05/2018  . COPD (chronic obstructive pulmonary disease) ([place]) 06/29/2017  . Right-sided low back pain with right-sided sciatica 12/05/2015  . Plantar fasciitis of right foot 10/15/2015  . Chronic neck and back pain 07/19/2015  . Fatigue 07/19/2015    Past Surgical History:  Procedure Laterality Date  . BACK SURGERY  2015  . INGUINAL HERNIA REPAIR Bilateral    multiple times  . lumbar discetomy  2005  . NECK SURGERY  2016    History reviewed. No pertinent family history.   Current Outpatient Medications:  .  acetaminophen (TYLENOL) 500 MG tablet, Take 500 mg by mouth every 6 (six) hours as needed., Disp: , Rfl:  .  albuterol (PROVENTIL HFA;VENTOLIN HFA) 108 (90 Base) MCG/ACT inhaler, Inhale 2 puffs into the lungs every 6 (six) hours as needed for wheezing or shortness of breath., Disp: 1 Inhaler, Rfl: 0 .  ARIPiprazole (ABILIFY) 5 MG tablet, Take 1 tablet (5 mg total) by mouth daily., Disp: 90 tablet, Rfl: 0 .  Ascorbic Acid (VITAMIN C PO), Take by mouth., Disp: , Rfl:  .  b complex vitamins tablet, Take 1 tablet by mouth daily.,  Disp: , Rfl:  .  benzonatate (TESSALON) 100 MG capsule, Take 1-2 capsules (100-200 mg total) by mouth 2 (two) times daily as needed., Disp: 40 capsule, Rfl: 0 .  BIOTIN PO, Take by mouth., Disp: , Rfl:  .  DULoxetine (CYMBALTA) 60 MG capsule, Take 1 capsule (60 mg total) by mouth daily., Disp: 90 capsule, Rfl: 1 .  fluticasone furoate-vilanterol (BREO ELLIPTA) 100-25 MCG/INH AEPB, Inhale 1 puff into the lungs daily., Disp: 180 each, Rfl: 0 .  hydrOXYzine (ATARAX/VISTARIL) 10 MG tablet, Take 1 tablet (10 mg total) by mouth 3 (three) times daily as needed., Disp: 90 tablet,  Rfl: 2 .  MAGNESIUM PO, Take by mouth., Disp: , Rfl:  .  naproxen (NAPROSYN) 500 MG tablet, Take 1 tablet (500 mg total) by mouth 2 (two) times daily with a meal., Disp: 180 tablet, Rfl: 1 .  terbinafine (LAMISIL) 250 MG tablet, Take 1 tablet (250 mg total) by mouth daily., Disp: 90 tablet, Rfl: 0 .  tiZANidine (ZANAFLEX) 4 MG tablet, Take 1 tablet (4 mg total) by mouth at bedtime., Disp: 90 tablet, Rfl: 1 .  traZODone (DESYREL) 50 MG tablet, TAKE 1/2 TO 1 TABLET BY MOUTH DAILY AT BEDTIME AS NEEDED FOR SLEEP (Patient taking differently: Pt taking 2 tablets qhs), Disp: 30 tablet, Rfl: 0 .  fluticasone (FLONASE) 50 MCG/ACT nasal spray, Place 2 sprays into both nostrils daily., Disp: 16 g, Rfl: 6 .  predniSONE (DELTASONE) 20 MG tablet, Take 1 tablet (20 mg total) by mouth 2 (two) times daily with a meal., Disp: 10 tablet, Rfl: 0 .  pregabalin (LYRICA) 50 MG capsule, Take 1 capsule (50 mg total) by mouth 3 (three) times daily., Disp: 90 capsule, Rfl: 0 .  rizatriptan (MAXALT) 10 MG tablet, Take 1 tablet (10 mg total) by mouth as needed for migraine. May repeat in 2 hours if needed, Disp: 10 tablet, Rfl: 0 .  tiotropium (SPIRIVA HANDIHALER) 18 MCG inhalation capsule, Place 1 capsule (18 mcg total) into inhaler and inhale daily., Disp: 90 capsule, Rfl: 0  Allergies  Allergen Reactions  . Gluten Meal     I personally reviewed active  problem list, medication list, allergies, family history, social history with the patient/caregiver today.   ROS   Ten systems reviewed and is negative except as mentioned in HPI   Objective  Virtual encounter, vitals not obtained.  Body mass index is 27.05 kg/m.  Physical Exam  Awake, alert and oriented  PHQ2/9: Depression screen [facility] 2/9 03/30/2019 01/14/2019 12/28/2018 11/26/2018 11/26/2018  Decreased Interest 3 1 1 1  0  Down, Depressed, Hopeless 1 1 0 1 0  PHQ - 2 Score 4 2 1 2  0  Altered sleeping 3 1 1 2  0  Tired, decreased energy 3 2 1 1  0  Change in appetite 1 0 1 2 0  Feeling bad or failure about yourself  1 0 0 0 0  Trouble concentrating 1 0 0 3 0  Moving slowly or fidgety/restless 1 0 0 1 0  Suicidal thoughts 0 0 0 0 0  PHQ-9 Score 14 5 4 11  0  Difficult doing work/chores Very difficult Somewhat difficult Not difficult at all Very difficult -  Some recent data might be hidden   PHQ-2/9 Result is positive.    Fall Risk: Fall Risk  03/30/2019 01/14/2019 12/28/2018 11/26/2018 05/25/2018  Falls in the past year? 1 1 1 1  0  Number falls in past yr: 0 1 0 0 0  Injury with Fall? 0 0 0 0 0  Risk for fall due to : - Impaired balance/gait;History of fall(s) - - -  Follow up - Falls prevention discussed - - -     Assessment & Plan  1. Moderate major depression (HCC)  - ARIPiprazole (ABILIFY) 5 MG tablet; Take 1 tablet (5 mg total) by mouth daily.  Dispense: 90 tablet; Refill: 0  2. Outbursts of anger  - ARIPiprazole (ABILIFY) 5 MG tablet; Take 1 tablet (5 mg total) by mouth daily.  Dispense: 90 tablet; Refill: 0  3. COPD with exacerbation ([place])  - predniSONE (DELTASONE) 20 MG tablet; Take 1  tablet (20 mg total) by mouth 2 (two) times daily with a meal.  Dispense: 10 tablet; Refill: 0 - tiotropium (SPIRIVA HANDIHALER) 18 MCG inhalation capsule; Place 1 capsule (18 mcg total) into inhaler and inhale daily.  Dispense: 90 capsule; Refill: 0  Discussed respiratory  clinic and he will send me a message if no better by tomorrow  4. Insomnia, unspecified type  Taking Trazodone prn   5. Chronic neck and back pain  - DULoxetine (CYMBALTA) 60 MG capsule; Take 1 capsule (60 mg total) by mouth daily.  Dispense: 90 capsule; Refill: 1  6. Chronic obstructive pulmonary disease, unspecified COPD type (HCC)  - fluticasone furoate-vilanterol (BREO ELLIPTA) 100-25 MCG/INH AEPB; Inhale 1 puff into the lungs daily.  Dispense: 180 each; Refill: 0  7. Intractable migraine without aura and with status migrainosus  - pregabalin (LYRICA) 50 MG capsule; Take 1 capsule (50 mg total) by mouth 3 (three) times daily.  Dispense: 90 capsule; Refill: 0 - rizatriptan (MAXALT) 10 MG tablet; Take 1 tablet (10 mg total) by mouth as needed for migraine. May repeat in 2 hours if needed  Dispense: 10 tablet; Refill: 0  8. Atherosclerosis Aorta  Not interested in statin therapy   9. Seasonal affective disorder ([place])    I discussed the assessment and treatment plan with the patient. The patient was provided an opportunity to ask questions and all were answered. The patient agreed with the plan and demonstrated an understanding of the instructions.  The patient was advised to call back or seek an in-person evaluation if the symptoms worsen or if the condition fails to improve as anticipated.  I provided 25  minutes of non-face-to-face time during this encounter.

## 2019-04-08 ENCOUNTER — Other Ambulatory Visit: Payer: Self-pay | Admitting: Family Medicine

## 2019-04-08 DIAGNOSIS — G43011 Migraine without aura, intractable, with status migrainosus: Secondary | ICD-10-CM

## 2019-04-12 MED ORDER — RIZATRIPTAN BENZOATE 10 MG PO TABS: 10.0000 mg | ORAL_TABLET | ORAL | 0 refills | Status: DC | PRN

## 2019-04-27 ENCOUNTER — Ambulatory Visit (INDEPENDENT_AMBULATORY_CARE_PROVIDER_SITE_OTHER): Payer: Medicare Other | Admitting: Family Medicine

## 2019-04-27 ENCOUNTER — Encounter: Payer: Self-pay | Admitting: Family Medicine

## 2019-04-27 ENCOUNTER — Other Ambulatory Visit: Payer: Self-pay

## 2019-04-27 VITALS — BP 136/84 | HR 89 | Temp 97.8°F | Resp 16 | Ht 72.0 in | Wt 210.6 lb

## 2019-04-27 DIAGNOSIS — J438 Other emphysema: Secondary | ICD-10-CM

## 2019-04-27 DIAGNOSIS — Z23 Encounter for immunization: Secondary | ICD-10-CM | POA: Diagnosis not present

## 2019-04-27 DIAGNOSIS — R9389 Abnormal findings on diagnostic imaging of other specified body structures: Secondary | ICD-10-CM

## 2019-04-27 DIAGNOSIS — G43011 Migraine without aura, intractable, with status migrainosus: Secondary | ICD-10-CM

## 2019-04-27 DIAGNOSIS — G4700 Insomnia, unspecified: Secondary | ICD-10-CM | POA: Diagnosis not present

## 2019-04-27 MED ORDER — TRAZODONE HCL 100 MG PO TABS: 100.0000 mg | ORAL_TABLET | Freq: Every day | ORAL | 0 refills | Status: DC

## 2019-04-27 MED ORDER — AMOXICILLIN-POT CLAVULANATE 875-125 MG PO TABS: 1.0000 | ORAL_TABLET | Freq: Two times a day (BID) | ORAL | 0 refills | Status: DC

## 2019-04-27 MED ORDER — BENZONATATE 200 MG PO CAPS: 200.0000 mg | ORAL_CAPSULE | Freq: Three times a day (TID) | ORAL | 0 refills | Status: DC | PRN

## 2019-04-27 MED ORDER — PREGABALIN 100 MG PO CAPS: 100.0000 mg | ORAL_CAPSULE | Freq: Three times a day (TID) | ORAL | 0 refills | Status: DC

## 2019-04-27 NOTE — Progress Notes (Signed)
Name: Ronald Ellis   MRN: [id number]    DOB: Mar 22, 1960   Date:04/27/2019       Progress Note  Subjective  Chief Complaint  Chief Complaint  Patient presents with  . Cough  . Shortness of Breath  . COPD    Having cough and SOB for last 2 months    HPI  COPD :Four flares in 2020, he was treated for a COPD flare one month ago, we treated with prednisone, but he continues to have productive cough, pain from coughing so much, fatigue, gargling sound on his chest, sob with activity. No fever or chills. He had a chest CT that showed ground glass opacification, we will refer him to Pulmonologist   Headaches:  he takes tylenol daily to control his back pain, he states that over the past 7-8  weeks he has noticed that he wakes up with sharp headache, usually above left eye. He has a history of migraine headaches many years ago. He states headaches has been constant and not better with tylenol . He has been taking Lyrica and seems to help with intensity, pain can go down from 9/10 to 3/10 when he takes Lyrica plus Maxalt, without Maxalt pain is down to around 7/10 . He also has episodes of wave of fatigue. He has nasal congestion and has been using a nasal spray as prescribed, no post-nasal drainage, but very tender to touch on right frontal area     Patient Active Problem List   Diagnosis Date Noted  . Atherosclerosis of aorta ([place]) 12/17/2018  . Personal history of tobacco use, presenting hazards to health 12/16/2018  . History of drug dependence/abuse ([place]) 11/26/2018  . Celiac disease 02/05/2018  . COPD (chronic obstructive pulmonary disease) ([place]) 06/29/2017  . Right-sided low back pain with right-sided sciatica 12/05/2015  . Plantar fasciitis of right foot 10/15/2015  . Chronic neck and back pain 07/19/2015  . Fatigue 07/19/2015    Past Surgical History:  Procedure Laterality Date  . BACK SURGERY  2015  . INGUINAL HERNIA REPAIR Bilateral    multiple times  . lumbar discetomy  2005   . NECK SURGERY  2016    History reviewed. No pertinent family history.  Social History   Tobacco Use  . Smoking status: Former Smoker    Packs/day: 1.00    Years: 40.00    Pack years: 40.00    Types: Cigarettes    Quit date: 01/02/2012    Years since quitting: 7.3  . Smokeless tobacco: Never Used  . Tobacco comment: currently just a couple cigars once a month   Substance Use Topics  . Alcohol use: Not Currently    Comment: quit 5-6 years ago, only beer - but glutten free diet now  . Drug use: Not Currently    Types: Cocaine, Marijuana, Heroin, "Crack" cocaine, Other-see comments    Comment: meths in his 20's-30's     Current Outpatient Medications:  .  acetaminophen (TYLENOL) 500 MG tablet, Take 500 mg by mouth every 6 (six) hours as needed., Disp: , Rfl:  .  albuterol (PROVENTIL HFA;VENTOLIN HFA) 108 (90 Base) MCG/ACT inhaler, Inhale 2 puffs into the lungs every 6 (six) hours as needed for wheezing or shortness of breath., Disp: 1 Inhaler, Rfl: 0 .  ARIPiprazole (ABILIFY) 5 MG tablet, Take 1 tablet (5 mg total) by mouth daily., Disp: 90 tablet, Rfl: 0 .  Ascorbic Acid (VITAMIN C PO), Take by mouth., Disp: , Rfl:  .  b complex  vitamins tablet, Take 1 tablet by mouth daily., Disp: , Rfl:  .  benzonatate (TESSALON) 100 MG capsule, Take 1-2 capsules (100-200 mg total) by mouth 2 (two) times daily as needed., Disp: 40 capsule, Rfl: 0 .  BIOTIN PO, Take by mouth., Disp: , Rfl:  .  DULoxetine (CYMBALTA) 60 MG capsule, Take 1 capsule (60 mg total) by mouth daily., Disp: 90 capsule, Rfl: 1 .  fluticasone (FLONASE) 50 MCG/ACT nasal spray, Place 2 sprays into both nostrils daily., Disp: 16 g, Rfl: 6 .  fluticasone furoate-vilanterol (BREO ELLIPTA) 100-25 MCG/INH AEPB, Inhale 1 puff into the lungs daily., Disp: 180 each, Rfl: 0 .  hydrOXYzine (ATARAX/VISTARIL) 10 MG tablet, Take 1 tablet (10 mg total) by mouth 3 (three) times daily as needed., Disp: 90 tablet, Rfl: 2 .  MAGNESIUM PO, Take  by mouth., Disp: , Rfl:  .  naproxen (NAPROSYN) 500 MG tablet, Take 1 tablet (500 mg total) by mouth 2 (two) times daily with a meal., Disp: 180 tablet, Rfl: 1 .  pregabalin (LYRICA) 50 MG capsule, Take 1 capsule (50 mg total) by mouth 3 (three) times daily., Disp: 90 capsule, Rfl: 0 .  rizatriptan (MAXALT) 10 MG tablet, Take 1 tablet (10 mg total) by mouth as needed for migraine. May repeat in 2 hours if needed, Disp: 10 tablet, Rfl: 0 .  terbinafine (LAMISIL) 250 MG tablet, Take 1 tablet (250 mg total) by mouth daily., Disp: 90 tablet, Rfl: 0 .  tiotropium (SPIRIVA HANDIHALER) 18 MCG inhalation capsule, Place 1 capsule (18 mcg total) into inhaler and inhale daily., Disp: 90 capsule, Rfl: 0 .  tiZANidine (ZANAFLEX) 4 MG tablet, Take 1 tablet (4 mg total) by mouth at bedtime., Disp: 90 tablet, Rfl: 1 .  traZODone (DESYREL) 50 MG tablet, TAKE 1/2 TO 1 TABLET BY MOUTH DAILY AT BEDTIME AS NEEDED FOR SLEEP (Patient taking differently: Pt taking 2 tablets qhs), Disp: 30 tablet, Rfl: 0 .  predniSONE (DELTASONE) 20 MG tablet, Take 1 tablet (20 mg total) by mouth 2 (two) times daily with a meal. (Patient not taking: Reported on 04/27/2019), Disp: 10 tablet, Rfl: 0  Allergies  Allergen Reactions  . Gluten Meal     I personally reviewed active problem list, medication list, allergies, family history, social history, health maintenance with the patient/caregiver today.   ROS  Constitutional: Negative for fever or weight change.  Respiratory: Positive  for cough and shortness of breath.   Cardiovascular: Negative for chest pain or palpitations.  Gastrointestinal: Negative for abdominal pain, no bowel changes.  Musculoskeletal: Negative for gait problem or joint swelling.  Skin: Negative for rash.  Neurological: Negative for dizziness, positive for  headache.  No other specific complaints in a complete review of systems (except as listed in HPI above).   Objective  Vitals:   04/27/19 1517  BP:  136/84  Pulse: 89  Resp: 16  Temp: 97.8 F (36.6 C)  TempSrc: Temporal  SpO2: 98%  Weight: 210 lb 9.6 oz (95.5 kg)  Height: 6' (1.829 m)    Body mass index is 28.56 kg/m.  Physical Exam  Constitutional: Patient appears well-developed and well-nourished. Overweight.  No distress.  HEENT: head atraumatic, normocephalic, pupils equal and reactive to light Cardiovascular: Normal rate, regular rhythm and normal heart sounds.  No murmur heard. No BLE edema. Pulmonary/Chest: Effort normal and some rhonchi on bases. No respiratory distress. Abdominal: Soft.  There is no tenderness. Psychiatric: Patient has a normal mood and affect. behavior is normal.  Judgment and thought content normal.  PHQ2/9: Depression screen [facility] 2/9 04/27/2019 03/30/2019 01/14/2019 12/28/2018 11/26/2018  Decreased Interest 1 3 1 1 1   Down, Depressed, Hopeless 1 1 1  0 1  PHQ - 2 Score 2 4 2 1 2   Altered sleeping 1 3 1 1 2   Tired, decreased energy 1 3 2 1 1   Change in appetite 1 1 0 1 2  Feeling bad or failure about yourself  0 1 0 0 0  Trouble concentrating 1 1 0 0 3  Moving slowly or fidgety/restless 0 1 0 0 1  Suicidal thoughts 0 0 0 0 0  PHQ-9 Score 6 14 5 4 11   Difficult doing work/chores Somewhat difficult Very difficult Somewhat difficult Not difficult at all Very difficult  Some recent data might be hidden    phq 9 is positive   Fall Risk: Fall Risk  04/27/2019 03/30/2019 01/14/2019 12/28/2018 11/26/2018  Falls in the past year? 1 1 1 1 1   Number falls in past yr: 0 0 1 0 0  Injury with Fall? 0 0 0 0 0  Risk for fall due to : - - Impaired balance/gait;History of fall(s) - -  Follow up - - Falls prevention discussed - -     Functional Status Survey: Is the patient deaf or have difficulty hearing?: No Does the patient have difficulty seeing, even when wearing glasses/contacts?: No Does the patient have difficulty concentrating, remembering, or making decisions?: No Does the patient have difficulty  walking or climbing stairs?: No Does the patient have difficulty dressing or bathing?: No Does the patient have difficulty doing errands alone such as visiting a doctor's office or shopping?: No    Assessment & Plan  1. Other emphysema ([place])  - benzonatate (TESSALON) 200 MG capsule; Take 1 capsule (200 mg total) by mouth 3 (three) times daily as needed.  Dispense: 100 capsule; Refill: 0 - amoxicillin-clavulanate (AUGMENTIN) 875-125 MG tablet; Take 1 tablet by mouth 2 (two) times daily.  Dispense: 20 tablet; Refill: 0 - Ambulatory referral to Pulmonology  2. Insomnia, unspecified type  - traZODone (DESYREL) 100 MG tablet; Take 1 tablet (100 mg total) by mouth at bedtime. Pt taking 2 tablets qhs  Dispense: 90 tablet; Refill: 0  3. Intractable migraine without aura and with status migrainosus  - pregabalin (LYRICA) 100 MG capsule; Take 1 capsule (100 mg total) by mouth 3 (three) times daily.  Dispense: 90 capsule; Refill: 0 - amoxicillin-clavulanate (AUGMENTIN) 875-125 MG tablet; Take 1 tablet by mouth 2 (two) times daily.  Dispense: 20 tablet; Refill: 0  4. Abnormal CT of the chest  - Ambulatory referral to Pulmonology  5. Needs flu shot  - Flu Vaccine QUAD 6+ mos PF IM (Fluarix Quad PF)

## 2019-05-02 ENCOUNTER — Encounter: Payer: Self-pay | Admitting: Family Medicine

## 2019-05-03 ENCOUNTER — Other Ambulatory Visit: Payer: Self-pay | Admitting: Family Medicine

## 2019-05-03 DIAGNOSIS — J449 Chronic obstructive pulmonary disease, unspecified: Secondary | ICD-10-CM

## 2019-05-26 ENCOUNTER — Ambulatory Visit: Payer: Medicare Other | Attending: Internal Medicine

## 2019-05-26 DIAGNOSIS — Z23 Encounter for immunization: Secondary | ICD-10-CM

## 2019-05-26 NOTE — Progress Notes (Signed)
   Covid-19 Vaccination Clinic  Name:  Ronald Ellis    MRN: [id number] DOB: 07-24-1960  05/26/2019  Mr. Chatmon was observed post Covid-19 immunization for 15 minutes without incident. He was provided with Vaccine Information Sheet and instruction to access the V-Safe system.   Mr. Obey was instructed to call 911 with any severe reactions post vaccine: Marland Kitchen Difficulty breathing  . Swelling of face and throat  . A fast heartbeat  . A bad rash all over body  . Dizziness and weakness   Immunizations Administered    Name Date Dose VIS Date Route   Pfizer COVID-19 Vaccine 05/26/2019  2:10 PM 0.3 mL 02/11/2019 Intramuscular   Manufacturer: [employer]   Lot: [id number]   [place]: [id number]

## 2019-05-30 ENCOUNTER — Encounter: Payer: Self-pay | Admitting: Family Medicine

## 2019-05-30 ENCOUNTER — Ambulatory Visit (INDEPENDENT_AMBULATORY_CARE_PROVIDER_SITE_OTHER): Payer: Medicare Other | Admitting: Family Medicine

## 2019-05-30 ENCOUNTER — Other Ambulatory Visit: Payer: Self-pay

## 2019-05-30 VITALS — BP 118/74 | HR 88 | Temp 97.3°F | Resp 18 | Ht 72.0 in | Wt 204.0 lb

## 2019-05-30 DIAGNOSIS — M542 Cervicalgia: Secondary | ICD-10-CM

## 2019-05-30 DIAGNOSIS — Z0000 Encounter for general adult medical examination without abnormal findings: Secondary | ICD-10-CM

## 2019-05-30 DIAGNOSIS — D72829 Elevated white blood cell count, unspecified: Secondary | ICD-10-CM | POA: Diagnosis not present

## 2019-05-30 DIAGNOSIS — D229 Melanocytic nevi, unspecified: Secondary | ICD-10-CM

## 2019-05-30 DIAGNOSIS — G43011 Migraine without aura, intractable, with status migrainosus: Secondary | ICD-10-CM

## 2019-05-30 DIAGNOSIS — M5416 Radiculopathy, lumbar region: Secondary | ICD-10-CM

## 2019-05-30 DIAGNOSIS — G8929 Other chronic pain: Secondary | ICD-10-CM

## 2019-05-30 DIAGNOSIS — E785 Hyperlipidemia, unspecified: Secondary | ICD-10-CM | POA: Diagnosis not present

## 2019-05-30 DIAGNOSIS — Z79899 Other long term (current) drug therapy: Secondary | ICD-10-CM | POA: Diagnosis not present

## 2019-05-30 DIAGNOSIS — J438 Other emphysema: Secondary | ICD-10-CM

## 2019-05-30 DIAGNOSIS — M549 Dorsalgia, unspecified: Secondary | ICD-10-CM

## 2019-05-30 MED ORDER — METHYLPREDNISOLONE 32 MG PO TABS: 32.0000 mg | ORAL_TABLET | Freq: Every day | ORAL | 0 refills | Status: DC

## 2019-05-30 MED ORDER — BENZONATATE 200 MG PO CAPS: 200.0000 mg | ORAL_CAPSULE | Freq: Three times a day (TID) | ORAL | 0 refills | Status: DC | PRN

## 2019-05-30 MED ORDER — PREGABALIN 100 MG PO CAPS: 100.0000 mg | ORAL_CAPSULE | Freq: Three times a day (TID) | ORAL | 0 refills | Status: DC

## 2019-05-30 MED ORDER — HYDROCODONE-ACETAMINOPHEN 10-325 MG PO TABS: 1.0000 | ORAL_TABLET | Freq: Three times a day (TID) | ORAL | 0 refills | Status: AC | PRN

## 2019-05-30 NOTE — Progress Notes (Signed)
Name: Ronald Ellis   MRN: [id number]    DOB: [birth year]   Date:05/30/2019       Progress Note  Subjective  Chief Complaint  Chief Complaint  Patient presents with  . Annual Exam  . Medication Refill    6 month F/U  . COPD  . Depression  . Atherosclerosis Aorta  . Pain  . Dyslipidemia    HPI  Patient presents for annual CPE and follow up   COPD: stopped smoking cigars and cough has improved, waiting to see pulmonologist . He states the visit was postponed by pulmonologist, he plans on going as scheduled. He is still coughing and would like a refill of tessalon perles.   Headache:  Improved with lyrica, still present but not as intense, more like a dull feeling now, no longer constant   Acute radiculitis : he has a history of back and neck surgery, his surgery was in [place] and he would not go back. He states he has chronic pain but usually 2-3/10 however since Thursday the pain has been intense and is affecting his ability to walk, sleep and do his regular activities. He states symptoms started suddenly when he flushed his toilette. No previous change in activity or trauma that could have caused the pain. He states it is intense on mid back radiates to both buttocks but goes laterally down mid thigh on the right side. No bowel or bladder incontinence, no rashes. Pain is sharp and intense.   MDD: he was seen recently, phq 9 is still high , worse than last visit but likely from increase in pain . We will continue duloxetine and he will return for follow up if no improvement    USPSTF grade A and B recommendations:  Diet: discussed healthy diet  Exercise: not currently   Depression: phq 9 is positive Depression screen [facility] 2/9 05/30/2019 04/27/2019 03/30/2019 01/14/2019 12/28/2018  Decreased Interest 1 1 3 1 1   Down, Depressed, Hopeless 1 1 1 1  0  PHQ - 2 Score 2 2 4 2 1   Altered sleeping 2 1 3 1 1   Tired, decreased energy 2 1 3 2 1   Change in appetite 2 1 1  0 1  Feeling bad or  failure about yourself  1 0 1 0 0  Trouble concentrating 1 1 1  0 0  Moving slowly or fidgety/restless 0 0 1 0 0  Suicidal thoughts 0 0 0 0 0  PHQ-9 Score 10 6 14 5 4   Difficult doing work/chores Somewhat difficult Somewhat difficult Very difficult Somewhat difficult Not difficult at all  Some recent data might be hidden    Hypertension:  BP Readings from Last 3 Encounters:  05/30/19 118/74  04/27/19 136/84  03/30/19 126/80    Obesity: Wt Readings from Last 3 Encounters:  05/30/19 204 lb (92.5 kg)  04/27/19 210 lb 9.6 oz (95.5 kg)  03/30/19 205 lb (93 kg)   BMI Readings from Last 3 Encounters:  05/30/19 27.67 kg/m  04/27/19 28.56 kg/m  03/30/19 27.05 kg/m     Lipids:  Lab Results  Component Value Date   CHOL 185 05/25/2018   CHOL 190 06/29/2017   Lab Results  Component Value Date   HDL 46 05/25/2018   HDL 35 (L) 06/29/2017   Lab Results  Component Value Date   LDLCALC 120 (H) 05/25/2018   LDLCALC 136 (H) 06/29/2017   Lab Results  Component Value Date   TRIG 88 05/25/2018   TRIG 87 06/29/2017  Lab Results  Component Value Date   CHOLHDL 4.0 05/25/2018   CHOLHDL 5.4 (H) 06/29/2017   No results found for: LDLDIRECT Glucose:  Glucose  Date Value Ref Range Status  05/12/2012 65 65 - 99 mg/dL Final   Glucose, Bld  Date Value Ref Range Status  05/25/2018 86 65 - 99 mg/dL Final    Comment:    .            Fasting reference interval .   06/29/2017 81 65 - 139 mg/dL Final    Comment:    .        Non-fasting reference interval .   01/16/2016 101 (H) 65 - 99 mg/dL Final      Office Visit from 04/06/2018 in [facility]  AUDIT-C Score  0      Significant Other STD testing and prevention (HIV/chl/gon/syphilis): not interested  Hep C: negative   Skin cancer: Discussed monitoring for atypical lesions Colorectal cancer: cologuard is up to date Prostate cancer: discussed USPTF  IPSS Questionnaire (AUA-7): Over the past  month.   1)  How often have you had a sensation of not emptying your bladder completely after you finish urinating?  3 - About half the time  2)  How often have you had to urinate again less than two hours after you finished urinating? 0 - Not at all  3)  How often have you found you stopped and started again several times when you urinated?  0 - Not at all  4) How difficult have you found it to postpone urination?  0 - Not at all  5) How often have you had a weak urinary stream?  0 - Not at all  6) How often have you had to push or strain to begin urination?  0 - Not at all  7) How many times did you most typically get up to urinate from the time you went to bed until the time you got up in the morning?  3 - 3 times  Total score:  0-7 mildly symptomatic   8-19 moderately symptomatic   20-35 severely symptomatic    Lung cancer:  Low Dose CT Chest recommended if Age 58-80 years, 30 pack-year currently smoking OR have quit w/in 15years. Patient does qualify.   AAA:  The USPSTF recommends one-time screening with ultrasonography in men ages 58 to 83 years who have ever smoked ECG:  2014  Advanced Care Planning: A voluntary discussion about advance care planning including the explanation and discussion of advance directives.  Discussed health care proxy and Living will, and the patient was able to identify a health care proxy as wife .  Patient does have a living will at present time.   Patient Active Problem List   Diagnosis Date Noted  . Atherosclerosis of aorta ([place]) 12/17/2018  . Personal history of tobacco use, presenting hazards to health 12/16/2018  . History of drug dependence/abuse ([place]) 11/26/2018  . Celiac disease 02/05/2018  . COPD (chronic obstructive pulmonary disease) ([place]) 06/29/2017  . Right-sided low back pain with right-sided sciatica 12/05/2015  . Plantar fasciitis of right foot 10/15/2015  . Chronic neck and back pain 07/19/2015  . Fatigue 07/19/2015    Past Surgical  History:  Procedure Laterality Date  . BACK SURGERY  2015  . INGUINAL HERNIA REPAIR Bilateral    multiple times  . lumbar discetomy  2005  . NECK SURGERY  2016    History reviewed. No pertinent family  history.  Social History   Socioeconomic History  . Marital status: Significant Other    Spouse name: [name]  . Number of children: 4  . Years of education: Not on file  . Highest education level: Some college, no degree  Occupational History  . 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 Smoking status: Former Smoker    Packs/day: 1.00    Years: 40.00    Pack years: 40.00    Types: Cigarettes    Quit date: 01/02/2012    Years since quitting: 7.4  . Smokeless tobacco: Never Used  . Tobacco comment: currently just a couple cigars once a month   Substance and Sexual Activity  . Alcohol use: Not Currently    Comment: quit 5-6 years ago, only beer - but glutten free diet now  . Drug use: Not Currently    Types: Cocaine, Marijuana, Heroin, "Crack" cocaine, Other-see comments    Comment: meths in his 20's-30's  . Sexual activity: Yes    Partners: Female  Other Topics Concern  . Not on file  Social History Narrative   Approved for disability 06/2017 ( workman's comp back 2014) , secondary to neck and back injury    Social Determinants of Health   Financial Resource Strain: Medium Risk  . Difficulty of Paying Living Expenses: Somewhat hard  Food Insecurity: Food Insecurity Present  . Worried About Charity fundraiser in the Last Year: Sometimes true  . Ran Out of Food in the Last Year: Sometimes true  Transportation Needs: No Transportation Needs  . Lack of Transportation (Medical): No  . Lack of Transportation (Non-Medical): No  Physical Activity: Inactive  . Days of Exercise per Week: 0 days  . Minutes of Exercise per Session: 0 min  Stress: Stress Concern Present  . Feeling of Stress : To some extent  Social  Connections: Somewhat Isolated  . Frequency of Communication with Friends and Family: More than three times a week  . Frequency of Social Gatherings with Friends and Family: Twice a week  . Attends Religious Services: Never  . Active Member of Clubs or Organizations: No  . Attends Archivist Meetings: Never  . Marital Status: Living with partner  Intimate Partner Violence: Not At Risk  . Fear of Current or Ex-Partner: No  . Emotionally Abused: No  . Physically Abused: No  . Sexually Abused: No     Current Outpatient Medications:  .  acetaminophen (TYLENOL) 500 MG tablet, Take 500 mg by mouth every 6 (six) hours as needed., Disp: , Rfl:  .  albuterol (PROVENTIL HFA;VENTOLIN HFA) 108 (90 Base) MCG/ACT inhaler, Inhale 2 puffs into the lungs every 6 (six) hours as needed for wheezing or shortness of breath., Disp: 1 Inhaler, Rfl: 0 .  ARIPiprazole (ABILIFY) 5 MG tablet, Take 1 tablet (5 mg total) by mouth daily., Disp: 90 tablet, Rfl: 0 .  Ascorbic Acid (VITAMIN C PO), Take by mouth., Disp: , Rfl:  .  b complex vitamins tablet, Take 1 tablet by mouth daily., Disp: , Rfl:  .  benzonatate (TESSALON) 200 MG capsule, Take 1 capsule (200 mg total) by mouth 3 (three) times daily as needed., Disp: 100 capsule, Rfl: 0 .  DULoxetine (CYMBALTA) 60 MG capsule, Take 1 capsule (60 mg total) by mouth daily., Disp: 90 capsule, Rfl: 1 .  fluticasone (FLONASE) 50 MCG/ACT nasal spray, Place 2 sprays into both nostrils daily.,  Disp: 16 g, Rfl: 6 .  fluticasone furoate-vilanterol (BREO ELLIPTA) 100-25 MCG/INH AEPB, Inhale 1 puff into the lungs daily., Disp: 180 each, Rfl: 0 .  hydrOXYzine (ATARAX/VISTARIL) 10 MG tablet, Take 1 tablet (10 mg total) by mouth 3 (three) times daily as needed., Disp: 90 tablet, Rfl: 2 .  MAGNESIUM PO, Take by mouth., Disp: , Rfl:  .  pregabalin (LYRICA) 100 MG capsule, Take 1 capsule (100 mg total) by mouth 3 (three) times daily., Disp: 270 capsule, Rfl: 0 .  rizatriptan  (MAXALT) 10 MG tablet, Take 1 tablet (10 mg total) by mouth as needed for migraine. May repeat in 2 hours if needed, Disp: 10 tablet, Rfl: 0 .  terbinafine (LAMISIL) 250 MG tablet, Take 1 tablet (250 mg total) by mouth daily., Disp: 90 tablet, Rfl: 0 .  tiotropium (SPIRIVA HANDIHALER) 18 MCG inhalation capsule, Place 1 capsule (18 mcg total) into inhaler and inhale daily., Disp: 90 capsule, Rfl: 0 .  tiZANidine (ZANAFLEX) 4 MG tablet, Take 1 tablet (4 mg total) by mouth at bedtime., Disp: 90 tablet, Rfl: 1 .  traZODone (DESYREL) 100 MG tablet, Take 1 tablet (100 mg total) by mouth at bedtime. Pt taking 2 tablets qhs, Disp: 90 tablet, Rfl: 0 .  BIOTIN PO, Take by mouth., Disp: , Rfl:  .  HYDROcodone-acetaminophen (NORCO) 10-325 MG tablet, Take 1 tablet by mouth every 8 (eight) hours as needed for up to 5 days., Disp: 15 tablet, Rfl: 0 .  methylPREDNISolone (MEDROL) 32 MG tablet, Take 1 tablet (32 mg total) by mouth daily., Disp: 1 tablet, Rfl: 0  Allergies  Allergen Reactions  . Gluten Meal      ROS  Constitutional: Negative for fever or weight change.  Respiratory: Negative for cough and shortness of breath.   Cardiovascular: Negative for chest pain or palpitations.  Gastrointestinal: Negative for abdominal pain, no bowel changes.  Musculoskeletal: Negative for gait problem or joint swelling. He has pain on low back, pacing  Skin: Negative for rash.  Neurological: Negative for dizziness , positive for mild  headache.  No other specific complaints in a complete review of systems (except as listed in HPI above).   Objective  Vitals:   05/30/19 1006  BP: 118/74  Pulse: 88  Resp: 18  Temp: (!) 97.3 F (36.3 C)  TempSrc: Temporal  SpO2: 94%  Weight: 204 lb (92.5 kg)  Height: 6' (1.829 m)    Body mass index is 27.67 kg/m.  Physical Exam  Constitutional: Patient appears well-developed and well-nourished. In mild  Distress, moving around because of pain.  HENT: Head:  Normocephalic and atraumatic. Ears: B TMs ok, no erythema or effusion; Nose: not done . Mouth/Throat: not done  Eyes: Conjunctivae and EOM are normal. Pupils are equal, round, and reactive to light. No scleral icterus.  Neck: Normal range of motion. Neck supple. No JVD present. No thyromegaly present.  Cardiovascular: Normal rate, regular rhythm and normal heart sounds.  No murmur heard. No BLE edema. Pulmonary/Chest: Effort normal and breath sounds normal. No respiratory distress. Abdominal: Soft. Bowel sounds are normal, no distension. There is no tenderness. no masses MALE GENITALIA: Normal descended testes bilaterally, no masses palpated, no hernias, no lesions, no discharge RECTAL: not done Musculoskeletal: decrease rom of lumbar spine, positive straight leg raise on right side Neurological: he is alert and oriented to person, place, and time. No cranial nerve deficit. Coordination, balance, strength, speech and gait are normal.  Skin: Skin is warm and dry. No  rash noted. No erythema. Moles on her back  Psychiatric: Patient has a normal mood and affect. behavior is normal. Judgment and thought content normal.  PHQ2/9: Depression screen [facility] 2/9 05/30/2019 04/27/2019 03/30/2019 01/14/2019 12/28/2018  Decreased Interest 1 1 3 1 1   Down, Depressed, Hopeless 1 1 1 1  0  PHQ - 2 Score 2 2 4 2 1   Altered sleeping 2 1 3 1 1   Tired, decreased energy 2 1 3 2 1   Change in appetite 2 1 1  0 1  Feeling bad or failure about yourself  1 0 1 0 0  Trouble concentrating 1 1 1  0 0  Moving slowly or fidgety/restless 0 0 1 0 0  Suicidal thoughts 0 0 0 0 0  PHQ-9 Score 10 6 14 5 4   Difficult doing work/chores Somewhat difficult Somewhat difficult Very difficult Somewhat difficult Not difficult at all  Some recent data might be hidden     Fall Risk: Fall Risk  05/30/2019 04/27/2019 03/30/2019 01/14/2019 12/28/2018  Falls in the past year? 1 1 1 1 1   Number falls in past yr: 0 0 0 1 0  Injury with Fall? 0 0  0 0 0  Risk for fall due to : - - - Impaired balance/gait;History of fall(s) -  Follow up - - - Falls prevention discussed -     Functional Status Survey: Is the patient deaf or have difficulty hearing?: No Does the patient have difficulty seeing, even when wearing glasses/contacts?: No Does the patient have difficulty concentrating, remembering, or making decisions?: No Does the patient have difficulty walking or climbing stairs?: No Does the patient have difficulty dressing or bathing?: No Does the patient have difficulty doing errands alone such as visiting a doctor's office or shopping?: No    Assessment & Plan  1. Other emphysema ([place])  benzonatate (TESSALON) 200 MG capsule; Take 1 capsule (200 mg total) by mouth 3 (three) times daily as needed.  Dispense: 100 capsule; Refill: 0  2. Intractable migraine without aura and with status migrainosus  - pregabalin (LYRICA) 100 MG capsule; Take 1 capsule (100 mg total) by mouth 3 (three) times daily.  Dispense: 270 capsule; Refill: 0  3. Lumbar radiculopathy, acute  - methylPREDNISolone (MEDROL) 32 MG tablet; Take 1 tablet (32 mg total) by mouth daily.  Dispense: 1 tablet; Refill: 0 - HYDROcodone-acetaminophen (NORCO) 10-325 MG tablet; Take 1 tablet by mouth every 8 (eight) hours as needed for up to 5 days.  Dispense: 15 tablet; Refill: 0  4. Well adult exam  - Lipid panel - COMPLETE METABOLIC PANEL WITH GFR - CBC with Differential/Platelet  5. Chronic neck and back pain  With acute flare of back pain   6. Dyslipidemia  - Lipid panel  7. Leukocytosis, unspecified type  - CBC with Differential/Platelet  8. Long-term use of high-risk medication  - COMPLETE METABOLIC PANEL WITH GFR - CBC with Differential/Platelet  9. Multiple atypical skin moles  - Ambulatory referral to Dermatology   -Prostate cancer screening and PSA options (with potential risks and benefits of testing vs not testing) were discussed along with  recent recs/guidelines. -USPSTF grade A and B recommendations reviewed with patient; age-appropriate recommendations, preventive care, screening tests, etc discussed and encouraged; healthy living encouraged; see AVS for patient education given to patient -Discussed importance of 150 minutes of physical activity weekly, eat two servings of fish weekly, eat one serving of tree nuts ( cashews, pistachios, pecans, almonds.Marland Kitchen) every other day, eat 6 servings of  fruit/vegetables daily and drink plenty of water and avoid sweet beverages.

## 2019-05-30 NOTE — Patient Instructions (Signed)
Preventive Care 41-58 Years Old, Male Preventive care refers to lifestyle choices and visits with your health care provider that can promote health and wellness. This includes:  A yearly physical exam. This is also called an annual well check.  Regular dental and eye exams.  Immunizations.  Screening for certain conditions.  Healthy lifestyle choices, such as eating a healthy diet, getting regular exercise, not using drugs or products that contain nicotine and tobacco, and limiting alcohol use. What can I expect for my preventive care visit? Physical exam Your health care provider will check:  Height and weight. These may be used to calculate body mass index (BMI), which is a measurement that tells if you are at a healthy weight.  Heart rate and blood pressure.  Your skin for abnormal spots. Counseling Your health care provider may ask you questions about:  Alcohol, tobacco, and drug use.  Emotional well-being.  Home and relationship well-being.  Sexual activity.  Eating habits.  Work and work Statistician. What immunizations do I need?  Influenza (flu) vaccine  This is recommended every year. Tetanus, diphtheria, and pertussis (Tdap) vaccine  You may need a Td booster every 10 years. Varicella (chickenpox) vaccine  You may need this vaccine if you have not already been vaccinated. Zoster (shingles) vaccine  You may need this after age 64. Measles, mumps, and rubella (MMR) vaccine  You may need at least one dose of MMR if you were born in 1957 or later. You may also need a second dose. Pneumococcal conjugate (PCV13) vaccine  You may need this if you have certain conditions and were not previously vaccinated. Pneumococcal polysaccharide (PPSV23) vaccine  You may need one or two doses if you smoke cigarettes or if you have certain conditions. Meningococcal conjugate (MenACWY) vaccine  You may need this if you have certain conditions. Hepatitis A  vaccine  You may need this if you have certain conditions or if you travel or work in places where you may be exposed to hepatitis A. Hepatitis B vaccine  You may need this if you have certain conditions or if you travel or work in places where you may be exposed to hepatitis B. Haemophilus influenzae type b (Hib) vaccine  You may need this if you have certain risk factors. Human papillomavirus (HPV) vaccine  If recommended by your health care provider, you may need three doses over 6 months. You may receive vaccines as individual doses or as more than one vaccine together in one shot (combination vaccines). Talk with your health care provider about the risks and benefits of combination vaccines. What tests do I need? Blood tests  Lipid and cholesterol levels. These may be checked every 5 years, or more frequently if you are over 60 years old.  Hepatitis C test.  Hepatitis B test. Screening  Lung cancer screening. You may have this screening every year starting at age 43 if you have a 30-pack-year history of smoking and currently smoke or have quit within the past 15 years.  Prostate cancer screening. Recommendations will vary depending on your family history and other risks.  Colorectal cancer screening. All adults should have this screening starting at age 72 and continuing until age 2. Your health care provider may recommend screening at age 14 if you are at increased risk. You will have tests every 1-10 years, depending on your results and the type of screening test.  Diabetes screening. This is done by checking your blood sugar (glucose) after you have not eaten  for a while (fasting). You may have this done every 1-3 years.  Sexually transmitted disease (STD) testing. Follow these instructions at home: Eating and drinking  Eat a diet that includes fresh fruits and vegetables, whole grains, lean protein, and low-fat dairy products.  Take vitamin and mineral supplements as  recommended by your health care provider.  Do not drink alcohol if your health care provider tells you not to drink.  If you drink alcohol: ? Limit how much you have to 0-2 drinks a day. ? Be aware of how much alcohol is in your drink. In the [place] one drink equals one 12 oz bottle of beer (355 mL), one 5 oz glass of wine (148 mL), or one 1 oz glass of hard liquor (44 mL). Lifestyle  Take daily care of your teeth and gums.  Stay active. Exercise for at least 30 minutes on 5 or more days each week.  Do not use any products that contain nicotine or tobacco, such as cigarettes, e-cigarettes, and chewing tobacco. If you need help quitting, ask your health care provider.  If you are sexually active, practice safe sex. Use a condom or other form of protection to prevent STIs (sexually transmitted infections).  Talk with your health care provider about taking a low-dose aspirin every day starting at age 53. What's next?  Go to your health care provider once a year for a well check visit.  Ask your health care provider how often you should have your eyes and teeth checked.  Stay up to date on all vaccines. This information is not intended to replace advice given to you by your health care provider. Make sure you discuss any questions you have with your health care provider. Document Revised: 02/11/2018 Document Reviewed: 02/11/2018 Elsevier Patient Education  2020 [employer].

## 2019-05-31 ENCOUNTER — Institutional Professional Consult (permissible substitution): Payer: Medicare Other | Admitting: Pulmonary Disease

## 2019-05-31 LAB — COMPLETE METABOLIC PANEL WITH GFR
AG Ratio: 1.6 (calc) (ref 1.0–2.5)
ALT: 29 U/L (ref 9–46)
AST: 22 U/L (ref 10–35)
Albumin: 4.5 g/dL (ref 3.6–5.1)
Alkaline phosphatase (APISO): 83 U/L (ref 35–144)
BUN: 15 mg/dL (ref 7–25)
CO2: 28 mmol/L (ref 20–32)
Calcium: 10 mg/dL (ref 8.6–10.3)
Chloride: 103 mmol/L (ref 98–110)
Creat: 1.17 mg/dL (ref 0.70–1.33)
GFR, Est African American: 80 mL/min/{1.73_m2} (ref >=60)
GFR, Est Non African American: 69 mL/min/{1.73_m2} (ref >=60)
Globulin: 2.9 g/dL (calc) (ref 1.9–3.7)
Glucose, Bld: 93 mg/dL (ref 65–99)
Potassium: 4.5 mmol/L (ref 3.5–5.3)
Sodium: 137 mmol/L (ref 135–146)
Total Bilirubin: 0.9 mg/dL (ref 0.2–1.2)
Total Protein: 7.4 g/dL (ref 6.1–8.1)

## 2019-05-31 LAB — LIPID PANEL
Cholesterol: 219 mg/dL — ABNORMAL HIGH (ref <200)
HDL: 42 mg/dL (ref >=40)
LDL Cholesterol (Calc): 158 mg/dL (calc) — ABNORMAL HIGH
Non-HDL Cholesterol (Calc): 177 mg/dL (calc) — ABNORMAL HIGH (ref <130)
Total CHOL/HDL Ratio: 5.2 (calc) — ABNORMAL HIGH (ref <5.0)
Triglycerides: 86 mg/dL (ref <150)

## 2019-05-31 LAB — CBC WITH DIFFERENTIAL/PLATELET
Absolute Monocytes: 806 cells/uL (ref 200–950)
Basophils Absolute: 48 cells/uL (ref 0–200)
Basophils Relative: 0.5 %
Eosinophils Absolute: 211 cells/uL (ref 15–500)
Eosinophils Relative: 2.2 %
HCT: 49.6 % (ref 38.5–50.0)
Hemoglobin: 16.8 g/dL (ref 13.2–17.1)
Lymphs Abs: 2189 cells/uL (ref 850–3900)
MCH: 33.8 pg — ABNORMAL HIGH (ref 27.0–33.0)
MCHC: 33.9 g/dL (ref 32.0–36.0)
MCV: 99.8 fL (ref 80.0–100.0)
MPV: 10.2 fL (ref 7.5–12.5)
Monocytes Relative: 8.4 %
Neutro Abs: 6346 cells/uL (ref 1500–7800)
Neutrophils Relative %: 66.1 %
Platelets: 281 10*3/uL (ref 140–400)
RBC: 4.97 10*6/uL (ref 4.20–5.80)
RDW: 11.9 % (ref 11.0–15.0)
Total Lymphocyte: 22.8 %
WBC: 9.6 10*3/uL (ref 3.8–10.8)

## 2019-06-09 DIAGNOSIS — L821 Other seborrheic keratosis: Secondary | ICD-10-CM | POA: Diagnosis not present

## 2019-06-09 DIAGNOSIS — D485 Neoplasm of uncertain behavior of skin: Secondary | ICD-10-CM | POA: Diagnosis not present

## 2019-06-09 DIAGNOSIS — D225 Melanocytic nevi of trunk: Secondary | ICD-10-CM | POA: Diagnosis not present

## 2019-06-09 DIAGNOSIS — D2262 Melanocytic nevi of left upper limb, including shoulder: Secondary | ICD-10-CM | POA: Diagnosis not present

## 2019-06-09 DIAGNOSIS — D034 Melanoma in situ of scalp and neck: Secondary | ICD-10-CM | POA: Diagnosis not present

## 2019-06-10 ENCOUNTER — Encounter: Payer: Self-pay | Admitting: Internal Medicine

## 2019-06-10 DIAGNOSIS — J449 Chronic obstructive pulmonary disease, unspecified: Secondary | ICD-10-CM | POA: Diagnosis not present

## 2019-06-10 DIAGNOSIS — R0902 Hypoxemia: Secondary | ICD-10-CM | POA: Diagnosis not present

## 2019-06-20 ENCOUNTER — Ambulatory Visit: Payer: Medicare Other | Attending: Internal Medicine

## 2019-06-20 DIAGNOSIS — Z23 Encounter for immunization: Secondary | ICD-10-CM

## 2019-06-20 NOTE — Progress Notes (Signed)
   Covid-19 Vaccination Clinic  Name:  Ronald Ellis    MRN: [id number] DOB: 1960/06/22  06/20/2019  [name] was observed post Covid-19 immunization for 15 minutes without incident. He was provided with Vaccine Information Sheet and instruction to access the V-Safe system.   [name] was instructed to call 911 with any severe reactions post vaccine: Marland Kitchen Difficulty breathing  . Swelling of face and throat  . A fast heartbeat  . A bad rash all over body  . Dizziness and weakness   Immunizations Administered    Name Date Dose VIS Date Route   Pfizer COVID-19 Vaccine 06/20/2019  1:17 PM 0.3 mL 04/27/2018 Intramuscular   Manufacturer: [place]   Lot: [id number]   [place]: [id number]

## 2019-06-21 ENCOUNTER — Encounter: Payer: Self-pay | Admitting: Internal Medicine

## 2019-06-21 ENCOUNTER — Other Ambulatory Visit: Payer: Self-pay

## 2019-06-21 ENCOUNTER — Ambulatory Visit: Payer: Medicare Other | Admitting: Internal Medicine

## 2019-06-21 VITALS — BP 130/72 | HR 78 | Temp 97.8°F | Ht 72.0 in | Wt 208.6 lb

## 2019-06-21 DIAGNOSIS — J449 Chronic obstructive pulmonary disease, unspecified: Secondary | ICD-10-CM

## 2019-06-21 DIAGNOSIS — R079 Chest pain, unspecified: Secondary | ICD-10-CM | POA: Diagnosis not present

## 2019-06-21 NOTE — Progress Notes (Signed)
Name: Ronald Ellis MRN: [id number] DOB: [birth year]     CONSULTATION DATE:  REFERRING MD :   CHIEF COMPLAINT:   STUDIES:     CXR independently reviewed by Me    CT chest Independently reviewed by Me  HISTORY OF PRESENT ILLNESS:    PAST MEDICAL HISTORY :   has a past medical history of Chronic pain, COPD (chronic obstructive pulmonary disease) ([place]), and Pneumonia.  has a past surgical history that includes Back surgery (2015); Neck surgery (2016); Inguinal hernia repair (Bilateral); and lumbar discetomy (2005). Prior to Admission medications   Medication Sig Start Date End Date Taking? Authorizing Provider  acetaminophen (TYLENOL) 500 MG tablet Take 500 mg by mouth every 6 (six) hours as needed.   Yes [provider]  albuterol (PROVENTIL HFA;VENTOLIN HFA) 108 (90 Base) MCG/ACT inhaler Inhale 2 puffs into the lungs every 6 (six) hours as needed for wheezing or shortness of breath. 06/29/17  Yes Sowles, Drue Stager, MD  ARIPiprazole (ABILIFY) 5 MG tablet Take 1 tablet (5 mg total) by mouth daily. 03/30/19  Yes Sowles, Drue Stager, MD  Ascorbic Acid (VITAMIN C PO) Take by mouth.   Yes [provider]  b complex vitamins tablet Take 1 tablet by mouth daily.   Yes [provider]  benzonatate (TESSALON) 200 MG capsule Take 1 capsule (200 mg total) by mouth 3 (three) times daily as needed. 05/30/19  Yes Steele Sizer, MD  BIOTIN PO Take by mouth.   Yes [provider]  DULoxetine (CYMBALTA) 60 MG capsule Take 1 capsule (60 mg total) by mouth daily. 03/30/19  Yes Sowles, Drue Stager, MD  fluticasone (FLONASE) 50 MCG/ACT nasal spray Place 2 sprays into both nostrils daily. 03/30/19  Yes Sowles, Drue Stager, MD  hydrOXYzine (ATARAX/VISTARIL) 10 MG tablet Take 1 tablet (10 mg total) by mouth 3 (three) times daily as needed. 12/28/18  Yes Steele Sizer, MD  MAGNESIUM PO Take by mouth.   Yes [provider]  pregabalin (LYRICA) 100 MG capsule Take 1 capsule (100 mg  total) by mouth 3 (three) times daily. 05/30/19  Yes Sowles, Drue Stager, MD  rizatriptan (MAXALT) 10 MG tablet Take 1 tablet (10 mg total) by mouth as needed for migraine. May repeat in 2 hours if needed 04/12/19  Yes Sowles, Drue Stager, MD  terbinafine (LAMISIL) 250 MG tablet Take 1 tablet (250 mg total) by mouth daily. 03/09/19  Yes Hyatt, Max T, DPM  tiotropium (SPIRIVA HANDIHALER) 18 MCG inhalation capsule Place 1 capsule (18 mcg total) into inhaler and inhale daily. 03/30/19  Yes Sowles, Drue Stager, MD  tiZANidine (ZANAFLEX) 4 MG tablet Take 1 tablet (4 mg total) by mouth at bedtime. 06/29/17  Yes Sowles, Drue Stager, MD  traZODone (DESYREL) 100 MG tablet Take 1 tablet (100 mg total) by mouth at bedtime. Pt taking 2 tablets qhs 04/27/19  Yes Sowles, Drue Stager, MD  fluticasone furoate-vilanterol (BREO ELLIPTA) 100-25 MCG/INH AEPB Inhale 1 puff into the lungs daily. Patient not taking: Reported on 06/21/2019 03/30/19   Steele Sizer, MD   Allergies  Allergen Reactions  . Gluten Meal     FAMILY HISTORY:  family history is not on file. SOCIAL HISTORY:  reports that he quit smoking about 5 weeks ago. His smoking use included cigarettes. He started smoking about 43 years ago. He has a 42.00 pack-year smoking history. He has never used smokeless tobacco. He reports previous alcohol use. He reports previous drug use. Drugs: Cocaine, Marijuana, Heroin, "Crack" cocaine, and Other-see comments.    Review of  Systems:  Gen:  Denies  fever, sweats, chills weigh loss  HEENT: Denies blurred vision, double vision, ear pain, eye pain, hearing loss, nose bleeds, sore throat Cardiac:  No dizziness, chest pain or heaviness, chest tightness,edema, No JVD Resp:   No cough, -sputum production, -shortness of breath,-wheezing, -hemoptysis,  Gi: Denies swallowing difficulty, stomach pain, nausea or vomiting, diarrhea, constipation, bowel incontinence Gu:  Denies bladder incontinence, burning urine Ext:   Denies Joint pain,  stiffness or swelling Skin: Denies  skin rash, easy bruising or bleeding or hives Endoc:  Denies polyuria, polydipsia , polyphagia or weight change Psych:   Denies depression, insomnia or hallucinations  Other:  All other systems negative     VITAL SIGNS: @VSRANGES @     SpO2: 98 % O2 Device: None (Room air)    Physical Examination:   GENERAL:NAD, no fevers, chills, no weakness no fatigue HEAD: Normocephalic, atraumatic.  EYES: PERLA, EOMI No scleral icterus.  MOUTH: Moist mucosal membrane.  EAR, NOSE, THROAT: Clear without exudates. No external lesions.  NECK: Supple.  PULMONARY: CTA B/L no wheezing, rhonchi, crackles CARDIOVASCULAR: S1 and S2. Regular rate and rhythm. No murmurs GASTROINTESTINAL: Soft, nontender, nondistended. Positive bowel sounds.  MUSCULOSKELETAL: No swelling, clubbing, or edema.  NEUROLOGIC: No gross focal neurological deficits. 5/5 strength all extremities SKIN: No ulceration, lesions, rashes, or cyanosis.  PSYCHIATRIC: Insight, judgment intact. -depression -anxiety ALL OTHER ROS ARE NEGATIVE   MEDICATIONS: I have reviewed all medications and confirmed regimen as documented    CULTURE RESULTS   No results found for this or any previous visit (from the past 240 hour(s)).        IMAGING    No results found.     ASSESSMENT AND PLAN SYNOPSIS    Patient/Family are satisfied with Plan of action and management. All questions answered  Corrin Parker, M.D.  [facility]  Medical Director [place] Director [facility] Cardio-Pulmonary Department

## 2019-06-21 NOTE — Progress Notes (Signed)
Name: Ronald Ellis MRN: [id number] DOB: 1960/12/14     CONSULTATION DATE: 06/21/2019  REFERRING MD : Ancil Boozer  CHIEF COMPLAINT: Shortness of breath COPD  STUDIES:   12/21/2018 CT chest Independently reviewed by Me Bilateral upper lobe predominant emphysematous changes along with mild interstitial lung disease throughout the lungs Images reviewed with the patient  HISTORY OF PRESENT ILLNESS: 59 year old pleasant white male seen today for assessment for COPD January 2021 patient developed loss of smell and loss of taste patient was tested for Covid 3 times but was tested negative Patient has been on and off antibiotics and prednisone over the last several months Patient with ongoing symptoms of wheezing cough shortness of breath and dyspnea exertion  At this time I explained to the patient he probably had diagnosis of COVID-19 pneumonia although his test was negative  At this time patient does have baseline shortness of breath and dyspnea exertion He does not have any active wheezing at this time or cough No signs of exacerbation at this time  Patient is a former smoker 30-pack-year smoker 1 pack a day for the last 30 years Patient continues to smoke cigars but quit 5 weeks ago  I have showed the CT images to the patient  Patient currently on Breo and also on Spiriva HandiHaler These inhalers do help his breathing  Patient is enrolled in lung cancer screening protocol  PAST MEDICAL HISTORY :   has a past medical history of Chronic pain, COPD (chronic obstructive pulmonary disease) ([place]), and Pneumonia.  has a past surgical history that includes Back surgery (2015); Neck surgery (2016); Inguinal hernia repair (Bilateral); and lumbar discetomy (2005). Prior to Admission medications   Medication Sig Start Date End Date Taking? Authorizing Provider  acetaminophen (TYLENOL) 500 MG tablet Take 500 mg by mouth every 6 (six) hours as needed.   Yes [provider]   albuterol (PROVENTIL HFA;VENTOLIN HFA) 108 (90 Base) MCG/ACT inhaler Inhale 2 puffs into the lungs every 6 (six) hours as needed for wheezing or shortness of breath. 06/29/17  Yes Sowles, Drue Stager, MD  ARIPiprazole (ABILIFY) 5 MG tablet Take 1 tablet (5 mg total) by mouth daily. 03/30/19  Yes Sowles, Drue Stager, MD  Ascorbic Acid (VITAMIN C PO) Take by mouth.   Yes [provider]  b complex vitamins tablet Take 1 tablet by mouth daily.   Yes [provider]  benzonatate (TESSALON) 200 MG capsule Take 1 capsule (200 mg total) by mouth 3 (three) times daily as needed. 05/30/19  Yes Steele Sizer, MD  BIOTIN PO Take by mouth.   Yes [provider]  DULoxetine (CYMBALTA) 60 MG capsule Take 1 capsule (60 mg total) by mouth daily. 03/30/19  Yes Sowles, Drue Stager, MD  fluticasone (FLONASE) 50 MCG/ACT nasal spray Place 2 sprays into both nostrils daily. 03/30/19  Yes Sowles, Drue Stager, MD  hydrOXYzine (ATARAX/VISTARIL) 10 MG tablet Take 1 tablet (10 mg total) by mouth 3 (three) times daily as needed. 12/28/18  Yes Steele Sizer, MD  MAGNESIUM PO Take by mouth.   Yes [provider]  pregabalin (LYRICA) 100 MG capsule Take 1 capsule (100 mg total) by mouth 3 (three) times daily. 05/30/19  Yes Sowles, Drue Stager, MD  rizatriptan (MAXALT) 10 MG tablet Take 1 tablet (10 mg total) by mouth as needed for migraine. May repeat in 2 hours if needed 04/12/19  Yes Sowles, Drue Stager, MD  terbinafine (LAMISIL) 250 MG tablet Take 1 tablet (250 mg total) by mouth daily. 03/09/19  Yes Hyatt,  Max T, DPM  tiotropium (SPIRIVA HANDIHALER) 18 MCG inhalation capsule Place 1 capsule (18 mcg total) into inhaler and inhale daily. 03/30/19  Yes Sowles, Drue Stager, MD  tiZANidine (ZANAFLEX) 4 MG tablet Take 1 tablet (4 mg total) by mouth at bedtime. 06/29/17  Yes Sowles, Drue Stager, MD  traZODone (DESYREL) 100 MG tablet Take 1 tablet (100 mg total) by mouth at bedtime. Pt taking 2 tablets qhs 04/27/19  Yes Sowles, Drue Stager,  MD  fluticasone furoate-vilanterol (BREO ELLIPTA) 100-25 MCG/INH AEPB Inhale 1 puff into the lungs daily. Patient not taking: Reported on 06/21/2019 03/30/19   Steele Sizer, MD   Allergies  Allergen Reactions  . Gluten Meal     FAMILY HISTORY:  family history is not on file. SOCIAL HISTORY:  reports that he quit smoking about 5 weeks ago. His smoking use included cigarettes. He started smoking about 43 years ago. He has a 42.00 pack-year smoking history. He has never used smokeless tobacco. He reports previous alcohol use. He reports previous drug use. Drugs: Cocaine, Marijuana, Heroin, "Crack" cocaine, and Other-see comments.    Review of Systems:  Gen:  Denies  fever, sweats, chills weigh loss  HEENT: Denies blurred vision, double vision, ear pain, eye pain, hearing loss, nose bleeds, sore throat Cardiac:  No dizziness, chest pain or heaviness, chest tightness,edema, No JVD Resp:   No cough, -sputum production, +shortness of breath,-wheezing, -hemoptysis,  Gi: Denies swallowing difficulty, stomach pain, nausea or vomiting, diarrhea, constipation, bowel incontinence Gu:  Denies bladder incontinence, burning urine Ext:   Denies Joint pain, stiffness or swelling Skin: Denies  skin rash, easy bruising or bleeding or hives Endoc:  Denies polyuria, polydipsia , polyphagia or weight change Psych:   Denies depression, insomnia or hallucinations  Other:  All other systems negative    BP 130/72 (BP Location: Left Arm, Cuff Size: Normal)   Pulse 78   Temp 97.8 F (36.6 C) (Temporal)   Ht 6' (1.829 m)   Wt 208 lb 9.6 oz (94.6 kg)   SpO2 98%   BMI 28.29 kg/m    SpO2: 98 % O2 Device: None (Room air)    Physical Examination:   GENERAL:NAD, no fevers, chills, no weakness no fatigue HEAD: Normocephalic, atraumatic.  EYES: PERLA, EOMI No scleral icterus.  NECK: Supple.  PULMONARY: CTA B/L no wheezing, rhonchi, crackles CARDIOVASCULAR: S1 and S2. Regular rate and rhythm. No  murmurs GASTROINTESTINAL: Soft, nontender, nondistended. Positive bowel sounds.  MUSCULOSKELETAL: No swelling, clubbing, or edema.  NEUROLOGIC: No gross focal neurological deficits. 5/5 strength all extremities SKIN: No ulceration, lesions, rashes, or cyanosis.  PSYCHIATRIC: Insight, judgment intact. -depression -anxiety ALL OTHER ROS ARE NEGATIVE   MEDICATIONS: I have reviewed all medications and confirmed regimen as documented       ASSESSMENT AND PLAN SYNOPSIS 59 year old pleasant white male seen today for assessment for COPD Based on the CT findings patient has lateral cystic and emphysematous changes along with honeycomb pattern in a mild form along the peripheral edges of his lung which is suggestive of underlying interstitial lung disease as well, it seems that patient did have signs symptoms of COVID-19 infection and pneumonia however never tested positive  CT images were reviewed in detail with the patient At this time patient has a diagnosis of COPD with interstitial lung disease I recommend obtaining pulmonary function testing for further evaluation I also recommend obtaining 6-minute walk test and overnight pulse oximetry to assess for exertional and nocturnal hypoxia  This time I recommend continuing inhalers  as prescribed Avoid allergens Avoid secondhand smoke    COVID-19 EDUCATION: The signs and symptoms of COVID-19 were discussed with the patient and how to seek care for testing.  The importance of social distancing was discussed today. Hand Washing Techniques and avoid touching face was advised.     MEDICATION ADJUSTMENTS/LABS AND TESTS ORDERED: Continue inhalers as prescribed  Check Overnight Pulse Oximtery  Check 6WMT and obtain PFT's  AVOID SECOND HAND SMOKE  FOLLOW UP CT CHEST FOR LUNG CANCER SCREENING EVERY YEAR   CURRENT MEDICATIONS REVIEWED AT LENGTH WITH PATIENT TODAY   Patient satisfied with Plan of action and management. All questions  answered  Follow up in 6 months Total time spent with patient 45 minutes  Maretta Bees Patricia Pesa, M.D.  [facility]  Medical Director [place] Director [facility] Cardio-Pulmonary Department

## 2019-06-21 NOTE — Patient Instructions (Addendum)
Continue inhalers as prescribed  Check Overnight Pulse Oximtery  Check 6WMT and obtain PFT's  AVOID SECOND HAND SMOKE  FOLLOW UP CT CHEST FOR LUNG CANCER SCREENING EVERY YEAR

## 2019-06-24 ENCOUNTER — Other Ambulatory Visit: Payer: Self-pay

## 2019-06-24 ENCOUNTER — Other Ambulatory Visit
Admission: RE | Admit: 2019-06-24 | Discharge: 2019-06-24 | Disposition: A | Discharge status: Home / Self Care | Payer: Medicare Other | Source: Ambulatory Visit | Attending: Internal Medicine | Admitting: Internal Medicine

## 2019-06-24 ENCOUNTER — Ambulatory Visit: Payer: Medicare Other | Admitting: Internal Medicine

## 2019-06-24 ENCOUNTER — Encounter: Payer: Self-pay | Admitting: Internal Medicine

## 2019-06-24 VITALS — BP 128/82 | HR 73 | Ht 72.0 in | Wt 204.4 lb

## 2019-06-24 DIAGNOSIS — R0602 Shortness of breath: Secondary | ICD-10-CM | POA: Insufficient documentation

## 2019-06-24 DIAGNOSIS — Z01812 Encounter for preprocedural laboratory examination: Secondary | ICD-10-CM | POA: Diagnosis not present

## 2019-06-24 DIAGNOSIS — Z20822 Contact with and (suspected) exposure to covid-19: Secondary | ICD-10-CM | POA: Diagnosis not present

## 2019-06-24 DIAGNOSIS — R079 Chest pain, unspecified: Secondary | ICD-10-CM | POA: Diagnosis not present

## 2019-06-24 LAB — SARS CORONAVIRUS 2 (TAT 6-24 HRS): SARS Coronavirus 2: NEGATIVE

## 2019-06-24 NOTE — Progress Notes (Signed)
New Outpatient Visit Date: 06/24/2019  Referring Provider: Flora Lipps, [address] [place],   [id number]  Chief Complaint: Shortness of breath and chest tightness  HPI:  Ronald Ellis is a 59 y.o. male who is being seen today for the evaluation of chest tightness and shortness of breath at the request of Dr. Mortimer Fries. He has a history of COPD, suspected interstitial lung disease, and presumed COVID-19 infection in 03/2019.  Ronald Ellis reports that he had several weeks of severe shortness of breath this winter associated with tightness in the center of his chest.  The tightness was present all the time but seemed to worsen when he would take deep breaths or exert himself.  It has gradually resolved, with only a vague discomfort still present at times over the last month.  Ronald Ellis is on several inhalers now and feels like they have not done much for his chest tightness.  Ronald Ellis denies a history of heart disease.  He also denies palpitations lightheadedness, orthopnea, and edema.  He has experienced intermittent PND over the last few months.  He has not undergone heart testing in the past.  His mobility is quite limited due to chronic back and neck problems, which have led to him being disabled.  --------------------------------------------------------------------------------------------------  Cardiovascular History & Procedures: Cardiovascular Problems:  Shortness of breath  Chest tightness  Risk Factors:  Male gender, tobacco use, and age greater than 42  Cath/PCI:  None  CV Surgery:  None  EP Procedures and Devices:  None  Non-Invasive Evaluation(s):  None  Recent CV Pertinent Labs: Lab Results  Component Value Date   CHOL 219 (H) 05/30/2019   HDL 42 05/30/2019   LDLCALC 158 (H) 05/30/2019   TRIG 86 05/30/2019   CHOLHDL 5.2 (H) 05/30/2019   K 4.5 05/30/2019   K 4.0 05/12/2012   BUN 15 05/30/2019   BUN 12 05/12/2012   CREATININE 1.17  05/30/2019    --------------------------------------------------------------------------------------------------  Past Medical History:  Diagnosis Date  . Arthritis   . Chronic pain   . COPD (chronic obstructive pulmonary disease) ([place])   . Gluten intolerance   . Pneumonia     Past Surgical History:  Procedure Laterality Date  . BACK SURGERY  2015  . BACK SURGERY    . INGUINAL HERNIA REPAIR Bilateral    multiple times  . lumbar discetomy  2005  . NECK SURGERY  2016    Current Meds  Medication Sig  . albuterol (PROVENTIL HFA;VENTOLIN HFA) 108 (90 Base) MCG/ACT inhaler Inhale 2 puffs into the lungs every 6 (six) hours as needed for wheezing or shortness of breath.  . ARIPiprazole (ABILIFY) 5 MG tablet Take 1 tablet (5 mg total) by mouth daily.  . Ascorbic Acid (VITAMIN C PO) Take by mouth.  Marland Kitchen b complex vitamins tablet Take 1 tablet by mouth daily.  . benzonatate (TESSALON) 200 MG capsule Take 1 capsule (200 mg total) by mouth 3 (three) times daily as needed.  Marland Kitchen BIOTIN PO Take by mouth.  . DULoxetine (CYMBALTA) 60 MG capsule Take 1 capsule (60 mg total) by mouth daily.  . fluticasone (FLONASE) 50 MCG/ACT nasal spray Place 2 sprays into both nostrils daily.  . fluticasone furoate-vilanterol (BREO ELLIPTA) 100-25 MCG/INH AEPB Inhale 1 puff into the lungs daily.  . hydrOXYzine (ATARAX/VISTARIL) 10 MG tablet Take 1 tablet (10 mg total) by mouth 3 (three) times daily as needed.  Marland Kitchen MAGNESIUM PO Take by mouth.  . pregabalin (  LYRICA) 100 MG capsule Take 1 capsule (100 mg total) by mouth 3 (three) times daily.  . rizatriptan (MAXALT) 10 MG tablet Take 1 tablet (10 mg total) by mouth as needed for migraine. May repeat in 2 hours if needed  . terbinafine (LAMISIL) 250 MG tablet Take 1 tablet (250 mg total) by mouth daily.  Marland Kitchen tiotropium (SPIRIVA HANDIHALER) 18 MCG inhalation capsule Place 1 capsule (18 mcg total) into inhaler and inhale daily.  Marland Kitchen tiZANidine (ZANAFLEX) 4 MG tablet Take 1  tablet (4 mg total) by mouth at bedtime.  . traZODone (DESYREL) 100 MG tablet Take 1 tablet (100 mg total) by mouth at bedtime. Pt taking 2 tablets qhs    Allergies: Gluten meal  Social History   Tobacco Use  . Smoking status: Former Smoker    Packs/day: 1.00    Years: 42.00    Pack years: 42.00    Types: Cigarettes    Start date: 4    Quit date: 05/17/2019    Years since quitting: 0.1  . Smokeless tobacco: Never Used  Substance Use Topics  . Alcohol use: Not Currently    Comment: quit 5-6 years ago, only beer - but glutten free diet now  . Drug use: Not Currently    Types: Cocaine, Marijuana, Heroin, "Crack" cocaine, Other-see comments    Comment: meths in his 13's-30's    Family History  Problem Relation Age of Onset  . Hypertension Father   . Lung disease Father   . Hypertension Brother   . Lung cancer Maternal Grandfather     Review of Systems: A 12-system review of systems was performed and was negative except as noted in the HPI.  --------------------------------------------------------------------------------------------------  Physical Exam: BP 128/82 (BP Location: Right Arm, Patient Position: Sitting, Cuff Size: Large)   Pulse 73   Ht 6' (1.829 m)   Wt 204 lb 6 oz (92.7 kg)   SpO2 98%   BMI 27.72 kg/m   General:  NAD. HEENT: No conjunctival pallor or scleral icterus. Facemask in place. Neck: Supple without lymphadenopathy, thyromegaly, JVD, or HJR. No carotid bruit. Lungs: Normal work of breathing. Coarse breath sounds with dry crackles, most pronounced at the lung bases.  No wheezes. Heart: Regular rate and rhythm without murmurs, rubs, or gallops. Non-displaced PMI. Abd: Bowel sounds present. Soft, NT/ND without hepatosplenomegaly Ext: No lower extremity edema. Radial, PT, and DP pulses are 2+ bilaterally Skin: Warm and dry without rash. Neuro: CNIII-XII intact. Strength and fine-touch sensation intact in upper and lower extremities  bilaterally. Psych: Normal mood and affect.  EKG:  NSR with rSR' in V1.  Otherwise, no significant abnormality.  Lab Results  Component Value Date   WBC 9.6 05/30/2019   HGB 16.8 05/30/2019   HCT 49.6 05/30/2019   MCV 99.8 05/30/2019   PLT 281 05/30/2019    Lab Results  Component Value Date   NA 137 05/30/2019   K 4.5 05/30/2019   CL 103 05/30/2019   CO2 28 05/30/2019   BUN 15 05/30/2019   CREATININE 1.17 05/30/2019   GLUCOSE 93 05/30/2019   ALT 29 05/30/2019    Lab Results  Component Value Date   CHOL 219 (H) 05/30/2019   HDL 42 05/30/2019   LDLCALC 158 (H) 05/30/2019   TRIG 86 05/30/2019   CHOLHDL 5.2 (H) 05/30/2019    --------------------------------------------------------------------------------------------------  ASSESSMENT AND PLAN: Chest tightness and shortness of breath: I suspect symptoms are largely due to underlying lung disease, potentially exacerbated by recent acute  infection suspected to be COVID-19.  Mr. Hoback has several cardiac risk factors, including a long history of tobacco use, male gender, and age > 27.  CT chest from 12/2018 does not show evidence of significant coronary artery calcification.  I have recommended that we obtain a transthoracic echocardiogram and pharmacologic myocardial perfusion stress test for further evaluation.  No medication changes will be made at this time.  Follow-up: Return to clinic in 4-6 weeks.  Nelva Bush, MD 06/24/2019 7:43 PM

## 2019-06-24 NOTE — Patient Instructions (Addendum)
Medication Instructions:  Your physician recommends that you continue on your current medications as directed. Please refer to the Current Medication list given to you today.  *If you need a refill on your cardiac medications before your next appointment, please call your pharmacy*   Lab Work: none If you have labs (blood work) drawn today and your tests are completely normal, you will receive your results only by: Marland Kitchen MyChart Message (if you have MyChart) OR . A paper copy in the mail If you have any lab test that is abnormal or we need to change your treatment, we will call you to review the results.   Testing/Procedures: 1- Your physician has requested that you have an echocardiogram. Echocardiography is a painless test that uses sound waves to create images of your heart. It provides your doctor with information about the size and shape of your heart and how well your heart's chambers and valves are working. This procedure takes approximately one hour. There are no restrictions for this procedure. You may get an IV, if needed, to receive an ultrasound enhancing agent through to better visualize your heart.    2- LEXISCAN - Your physician has requested that you have a lexiscan myoview. For further information please visit [email or web address]. Please follow instruction sheet, as given.  [place]  Your caregiver has ordered a Stress Test with nuclear imaging. The purpose of this test is to evaluate the blood supply to your heart muscle. This procedure is referred to as a "Non-Invasive Stress Test." This is because other than having an IV started in your vein, nothing is inserted or "invades" your body. Cardiac stress tests are done to find areas of poor blood flow to the heart by determining the extent of coronary artery disease (CAD). Some patients exercise on a treadmill, which naturally increases the blood flow to your heart, while others who are  unable to walk on a treadmill due to  physical limitations have a pharmacologic/chemical stress agent called Lexiscan . This medicine will mimic walking on a treadmill by temporarily increasing your coronary blood flow.   Please note: these test may take anywhere between 2-4 hours to complete  PLEASE REPORT TO [facility] AT THE FIRST DESK WILL DIRECT YOU WHERE TO GO  Date of Procedure:_____________________________________  Arrival Time for Procedure:______________________________   PLEASE NOTIFY THE OFFICE AT LEAST 24 HOURS IN ADVANCE IF YOU ARE UNABLE TO KEEP YOUR APPOINTMENT.  [phone] AND  PLEASE NOTIFY NUCLEAR MEDICINE AT [facility] AT LEAST 24 HOURS IN ADVANCE IF YOU ARE UNABLE TO KEEP YOUR APPOINTMENT. [phone]  How to prepare for your Myoview test:  1. Do not eat or drink after midnight 2. No caffeine for 24 hours prior to test 3. No smoking 24 hours prior to test. 4. Your medication may be taken with water.  If your doctor stopped a medication because of this test, do not take that medication. 5. Ladies, please do not wear dresses.  Skirts or pants are appropriate. Please wear a short sleeve shirt. 6. No perfume, cologne or lotion. 7. Wear comfortable walking shoes.   Follow-Up: At [facility], you and your health needs are our priority.  As part of our continuing mission to provide you with exceptional heart care, we have created designated Provider Care Teams.  These Care Teams include your primary Cardiologist (physician) and Advanced Practice Providers (APPs -  Physician Assistants and Nurse Practitioners) who all work together to provide you with the care  you need, when you need it.  We recommend signing up for the patient portal called "MyChart".  Sign up information is provided on this After Visit Summary.  MyChart is used to connect with patients for Virtual Visits (Telemedicine).  Patients are able to view lab/test results, encounter notes, upcoming appointments, etc.   Non-urgent messages can be sent to your provider as well.   To learn more about what you can do with MyChart, go to [email or web address].    Your next appointment:   4-6 week(s)  The format for your next appointment:   In Person  Provider:    You may see DR Harrell Gave END or one of the following Advanced Practice Providers on your designated Care Team:    Murray Hodgkins, NP  Christell Faith, PA-C  Marrianne Mood, PA-C    Cardiac Nuclear Scan A cardiac nuclear scan is a test that measures blood flow to the heart when a person is resting and when he or she is exercising. The test looks for problems such as:  Not enough blood reaching a portion of the heart.  The heart muscle not working normally. You may need this test if:  You have heart disease.  You have had abnormal lab results.  You have had heart surgery or a balloon procedure to open up blocked arteries (angioplasty).  You have chest pain.  You have shortness of breath. In this test, a radioactive dye (tracer) is injected into your bloodstream. After the tracer has traveled to your heart, an imaging device is used to measure how much of the tracer is absorbed by or distributed to various areas of your heart. This procedure is usually done at a hospital and takes 2-4 hours. Tell a health care provider about:  Any allergies you have.  All medicines you are taking, including vitamins, herbs, eye drops, creams, and over-the-counter medicines.  Any problems you or family members have had with anesthetic medicines.  Any blood disorders you have.  Any surgeries you have had.  Any medical conditions you have.  Whether you are pregnant or may be pregnant. What are the risks? Generally, this is a safe procedure. However, problems may occur, including:  Serious chest pain and heart attack. This is only a risk if the stress portion of the test is done.  Rapid heartbeat.  Sensation of warmth in your chest. This  usually passes quickly.  Allergic reaction to the tracer. What happens before the procedure?  Ask your health care provider about changing or stopping your regular medicines. This is especially important if you are taking diabetes medicines or blood thinners.  Follow instructions from your health care provider about eating or drinking restrictions.  Remove your jewelry on the day of the procedure. What happens during the procedure?  An IV will be inserted into one of your veins.  Your health care provider will inject a small amount of radioactive tracer through the IV.  You will wait for 20-40 minutes while the tracer travels through your bloodstream.  Your heart activity will be monitored with an electrocardiogram (ECG).  You will lie down on an exam table.  Images of your heart will be taken for about 15-20 minutes.  You may also have a stress test. For this test, one of the following may be done: ? You will exercise on a treadmill or stationary bike. While you exercise, your heart's activity will be monitored with an ECG, and your blood pressure will be checked. ? You will be  given medicines that will increase blood flow to parts of your heart. This is done if you are unable to exercise.  When blood flow to your heart has peaked, a tracer will again be injected through the IV.  After 20-40 minutes, you will get back on the exam table and have more images taken of your heart.  Depending on the type of tracer used, scans may need to be repeated 3-4 hours later.  Your IV line will be removed when the procedure is over. The procedure may vary among health care providers and hospitals. What happens after the procedure?  Unless your health care provider tells you otherwise, you may return to your normal schedule, including diet, activities, and medicines.  Unless your health care provider tells you otherwise, you may increase your fluid intake. This will help to flush the  contrast dye from your body. Drink enough fluid to keep your urine pale yellow.  Ask your health care provider, or the department that is doing the test: ? When will my results be ready? ? How will I get my results? Summary  A cardiac nuclear scan measures the blood flow to the heart when a person is resting and when he or she is exercising.  Tell your health care provider if you are pregnant.  Before the procedure, ask your health care provider about changing or stopping your regular medicines. This is especially important if you are taking diabetes medicines or blood thinners.  After the procedure, unless your health care provider tells you otherwise, increase your fluid intake. This will help flush the contrast dye from your body.  After the procedure, unless your health care provider tells you otherwise, you may return to your normal schedule, including diet, activities, and medicines. This information is not intended to replace advice given to you by your health care provider. Make sure you discuss any questions you have with your health care provider. Document Revised: 08/03/2017 Document Reviewed: 08/03/2017 Elsevier Patient Education  [place].    Echocardiogram An echocardiogram is a procedure that uses painless sound waves (ultrasound) to produce an image of the heart. Images from an echocardiogram can provide important information about:  Signs of coronary artery disease (CAD).  Aneurysm detection. An aneurysm is a weak or damaged part of an artery wall that bulges out from the normal force of blood pumping through the body.  Heart size and shape. Changes in the size or shape of the heart can be associated with certain conditions, including heart failure, aneurysm, and CAD.  Heart muscle function.  Heart valve function.  Signs of a past heart attack.  Fluid buildup around the heart.  Thickening of the heart muscle.  A tumor or infectious growth around the  heart valves. Tell a health care provider about:  Any allergies you have.  All medicines you are taking, including vitamins, herbs, eye drops, creams, and over-the-counter medicines.  Any blood disorders you have.  Any surgeries you have had.  Any medical conditions you have.  Whether you are pregnant or may be pregnant. What are the risks? Generally, this is a safe procedure. However, problems may occur, including:  Allergic reaction to dye (contrast) that may be used during the procedure. What happens before the procedure? No specific preparation is needed. You may eat and drink normally. What happens during the procedure?   An IV tube may be inserted into one of your veins.  You may receive contrast through this tube. A contrast is  an injection that improves the quality of the pictures from your heart.  A gel will be applied to your chest.  A wand-like tool (transducer) will be moved over your chest. The gel will help to transmit the sound waves from the transducer.  The sound waves will harmlessly bounce off of your heart to allow the heart images to be captured in real-time motion. The images will be recorded on a computer. The procedure may vary among health care providers and hospitals. What happens after the procedure?  You may return to your normal, everyday life, including diet, activities, and medicines, unless your health care provider tells you not to do that. Summary  An echocardiogram is a procedure that uses painless sound waves (ultrasound) to produce an image of the heart.  Images from an echocardiogram can provide important information about the size and shape of your heart, heart muscle function, heart valve function, and fluid buildup around your heart.  You do not need to do anything to prepare before this procedure. You may eat and drink normally.  After the echocardiogram is completed, you may return to your normal, everyday life, unless your health  care provider tells you not to do that. This information is not intended to replace advice given to you by your health care provider. Make sure you discuss any questions you have with your health care provider. Document Revised: 06/10/2018 Document Reviewed: 03/22/2016 Elsevier Patient Education  [place].

## 2019-06-27 ENCOUNTER — Ambulatory Visit: Payer: Medicare Other | Attending: Internal Medicine

## 2019-06-27 ENCOUNTER — Other Ambulatory Visit: Payer: Self-pay

## 2019-06-27 DIAGNOSIS — J449 Chronic obstructive pulmonary disease, unspecified: Secondary | ICD-10-CM

## 2019-06-27 MED ORDER — ALBUTEROL SULFATE (2.5 MG/3ML) 0.083% IN NEBU
2.5000 mg | INHALATION_SOLUTION | Freq: Once | RESPIRATORY_TRACT | Status: AC
  Administered 2019-06-27: 2.5 mg via RESPIRATORY_TRACT
  Filled 2019-06-27: qty 3

## 2019-06-29 ENCOUNTER — Ambulatory Visit: Payer: Medicare Other | Admitting: Podiatry

## 2019-07-04 ENCOUNTER — Other Ambulatory Visit: Payer: Self-pay | Admitting: Family Medicine

## 2019-07-04 DIAGNOSIS — J441 Chronic obstructive pulmonary disease with (acute) exacerbation: Secondary | ICD-10-CM

## 2019-07-04 DIAGNOSIS — R454 Irritability and anger: Secondary | ICD-10-CM

## 2019-07-04 DIAGNOSIS — F321 Major depressive disorder, single episode, moderate: Secondary | ICD-10-CM

## 2019-07-04 NOTE — Telephone Encounter (Signed)
Requested medication (s) are due for refill today: Yes  Requested medication (s) are on the active medication list: Yes  Last refill:  03/30/19  Future visit scheduled: Yes  Notes to clinic:  See request.    Requested Prescriptions  Pending Prescriptions Disp Refills   ARIPiprazole (ABILIFY) 5 MG tablet [Pharmacy Med Name: ARIPIPRAZOLE 5MG TABLETS] 90 tablet 0    Sig: TAKE 1 TABLET(5 MG) BY MOUTH DAILY      Not Delegated - Psychiatry:  Antipsychotics - Second Generation (Atypical) - aripiprazole Failed - 07/04/2019  3:31 PM      Failed - This refill cannot be delegated      Passed - Valid encounter within last 6 months    Recent Outpatient Visits           1 month ago Lumbar radiculopathy, acute   [facility] Steele Sizer, MD   2 months ago Other emphysema [facility])   [facility] Steele Sizer, MD   3 months ago COPD with exacerbation [facility])   [facility] Steele Sizer, MD   6 months ago Atherosclerosis of aorta [facility])   [facility] Steele Sizer, MD   7 months ago Chronic obstructive pulmonary disease, unspecified COPD type [facility])   [facility] Steele Sizer, MD       Future Appointments             In 1 month Dunn, Areta Haber, PA-C [facility]   In 1 month Steele Sizer, MD [facility], [place]   In 6 months  [facility]             Signed Prescriptions Disp Refills   [place] 18 MCG inhalation capsule 90 capsule 0    Sig: INHALE THE CONTENTS OF 1 CAPSULE VIA HANDIHALER DAILY      Pulmonology:  Anticholinergic Agents Passed - 07/04/2019  3:31 PM      Passed - Valid encounter within last 12 months    Recent Outpatient Visits           1 month ago Lumbar radiculopathy, acute   [facility] Steele Sizer, MD   2 months ago Other emphysema [facility])   [facility] Sizer, MD   3 months ago COPD with exacerbation [facility])   [facility] Steele Sizer, MD   6 months ago Atherosclerosis of aorta [facility])   [facility] Steele Sizer, MD   7 months ago Chronic obstructive pulmonary disease, unspecified COPD type [facility])   [facility] Steele Sizer, MD       Future Appointments             In 1 month Dunn, Areta Haber, PA-C [facility], [place]   In 1 month Steele Sizer, MD [facility], [place]   In 6 months  [place]

## 2019-07-07 ENCOUNTER — Ambulatory Visit (INDEPENDENT_AMBULATORY_CARE_PROVIDER_SITE_OTHER): Payer: Medicare Other | Admitting: Internal Medicine

## 2019-07-07 ENCOUNTER — Other Ambulatory Visit: Payer: Self-pay

## 2019-07-07 DIAGNOSIS — J441 Chronic obstructive pulmonary disease with (acute) exacerbation: Secondary | ICD-10-CM

## 2019-07-07 DIAGNOSIS — D2362 Other benign neoplasm of skin of left upper limb, including shoulder: Secondary | ICD-10-CM | POA: Diagnosis not present

## 2019-07-13 ENCOUNTER — Telehealth: Payer: Self-pay | Admitting: Internal Medicine

## 2019-07-13 DIAGNOSIS — J9611 Chronic respiratory failure with hypoxia: Secondary | ICD-10-CM

## 2019-07-13 NOTE — Telephone Encounter (Signed)
Per Dr Zoila Shutter review of ONO on RA done by [facility]  06/10/10-06/11/19- pt needs o2 2lpm with sleep.  I spoke with the pt and notified him of this and he verbalized understanding. Order sent to [facility].

## 2019-07-14 DIAGNOSIS — J449 Chronic obstructive pulmonary disease, unspecified: Secondary | ICD-10-CM | POA: Diagnosis not present

## 2019-07-27 ENCOUNTER — Telehealth: Payer: Self-pay | Admitting: Internal Medicine

## 2019-07-27 NOTE — Telephone Encounter (Signed)
This is Dr. Zoila Shutter patient I will need to find out where they faxed the CMN

## 2019-08-03 ENCOUNTER — Telehealth: Payer: Self-pay | Admitting: Internal Medicine

## 2019-08-03 DIAGNOSIS — Z85820 Personal history of malignant melanoma of skin: Secondary | ICD-10-CM | POA: Insufficient documentation

## 2019-08-03 DIAGNOSIS — D034 Melanoma in situ of scalp and neck: Secondary | ICD-10-CM | POA: Diagnosis not present

## 2019-08-03 NOTE — Telephone Encounter (Signed)
Ronald Ellis was this taken care of?

## 2019-08-03 NOTE — Progress Notes (Signed)
Cardiology Office Note    Date:  08/08/2019   ID:  Ronald Ellis, DOB [birth year], MRN [id number]  PCP:  Steele Sizer, MD  Cardiologist:  Nelva Bush, MD  Electrophysiologist:  None   Chief Complaint: Follow up  History of Present Illness:   Ronald Ellis is a 59 y.o. male with history of COPD, suspected COVID-19 infection in 03/2019, and suspected ILD who presents for follow up of [employer] MPI and echo.   He was evaluated by Dr. Saunders Revel at the request of his pulmonologist for chest tightness and SOB. At that time, he reported severe SOB with chest tightness in the winter of 2021 that seemed to worsen with deep inspiration or when he would exert himself. Symptoms gradually improved without intervention, with only vague chest discomfort lingering. He did not feel like his inhalers were helping his symptoms much. Symptoms were felt to be largely pulmonology in etiology, though he was noted to have several cardiac risk factors including a long history of tobacco use, male gender and age > 47. Prior chest CT from 12/2018 was reviewed and showed no evidence of significant coronary artery calcification. He underwent echo on 08/04/2019, which showed an EF of 65 to 70%, no regional wall motion normalities, normal LV diastolic function parameters, normal RV systolic function and ventricular cavity size, normal PASP, no significant valvular abnormalities.  Lexiscan MPI on 6/4 showed no significant ischemia, EF 55 to 65%, and overall a low risk study.   He comes in today doing well from a cardiac perspective.  Since he was last seen he has noted continued improvement in his chest tightness and dyspnea.  He denies any frank chest pain.  Inhalers are helping.  No lower extremity swelling, abdominal distention, or orthopnea.  Attempting to work on lifestyle modification.  He continues to abstain from smoking.   Labs independently reviewed: 05/2019 - HGB 16.8, PLT 281, potassium 4.5, BUN 15, SCr 1.17, albumin 4.5,  AST/ALT normal, TC 219, TG 86, HDL 42, LDL 158 05/2018 - A1c 5.4 01/2016 - TSH normal  Past Medical History:  Diagnosis Date  . Arthritis   . Chronic pain   . COPD (chronic obstructive pulmonary disease) ([place])   . Gluten intolerance   . Pneumonia     Past Surgical History:  Procedure Laterality Date  . BACK SURGERY  2015  . BACK SURGERY    . INGUINAL HERNIA REPAIR Bilateral    multiple times  . lumbar discetomy  2005  . MOLE REMOVAL    . NECK SURGERY  2016    Current Medications: Current Meds  Medication Sig  . albuterol (PROVENTIL HFA;VENTOLIN HFA) 108 (90 Base) MCG/ACT inhaler Inhale 2 puffs into the lungs every 6 (six) hours as needed for wheezing or shortness of breath.  . ARIPiprazole (ABILIFY) 5 MG tablet TAKE 1 TABLET(5 MG) BY MOUTH DAILY  . Ascorbic Acid (VITAMIN C PO) Take by mouth.  Marland Kitchen b complex vitamins tablet Take 1 tablet by mouth daily.  . benzonatate (TESSALON) 200 MG capsule Take 1 capsule (200 mg total) by mouth 3 (three) times daily as needed.  Marland Kitchen BIOTIN PO Take by mouth.  . DULoxetine (CYMBALTA) 60 MG capsule Take 1 capsule (60 mg total) by mouth daily.  . fluticasone (FLONASE) 50 MCG/ACT nasal spray Place 2 sprays into both nostrils daily.  . fluticasone furoate-vilanterol (BREO ELLIPTA) 100-25 MCG/INH AEPB Inhale 1 puff into the lungs daily.  . hydrOXYzine (ATARAX/VISTARIL) 10 MG tablet Take 1 tablet (10 mg  total) by mouth 3 (three) times daily as needed.  Marland Kitchen MAGNESIUM PO Take by mouth.  . pregabalin (LYRICA) 100 MG capsule Take 1 capsule (100 mg total) by mouth 3 (three) times daily.  . rizatriptan (MAXALT) 10 MG tablet Take 1 tablet (10 mg total) by mouth as needed for migraine. May repeat in 2 hours if needed  . SPIRIVA HANDIHALER 18 MCG inhalation capsule INHALE THE CONTENTS OF 1 CAPSULE VIA HANDIHALER DAILY  . terbinafine (LAMISIL) 250 MG tablet Take 1 tablet (250 mg total) by mouth daily.  Marland Kitchen tiZANidine (ZANAFLEX) 4 MG tablet Take 1 tablet (4 mg total)  by mouth at bedtime.  . traZODone (DESYREL) 100 MG tablet Take 1 tablet (100 mg total) by mouth at bedtime. Pt taking 2 tablets qhs    Allergies:   Gluten meal   Social History   Socioeconomic History  . Marital status: Significant Other    Spouse name: [name]  . Number of children: 4  . Years of education: Not on file  . Highest education level: Some college, no degree  Occupational History  . 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 Smoking status: Former Smoker    Packs/day: 1.00    Years: 42.00    Pack years: 42.00    Types: Cigarettes    Start date: 25    Quit date: 05/17/2019    Years since quitting: 0.2  . Smokeless tobacco: Never Used  Substance and Sexual Activity  . Alcohol use: Not Currently    Comment: quit 5-6 years ago, only beer - but glutten free diet now  . Drug use: Not Currently    Types: Cocaine, Marijuana, Heroin, "Crack" cocaine, Other-see comments    Comment: meths in his 20's-30's  . Sexual activity: Yes    Partners: Female  Other Topics Concern  . Not on file  Social History Narrative   Approved for disability 06/2017 ( workman's comp back 2014) , secondary to neck and back injury    Social Determinants of Health   Financial Resource Strain: Medium Risk  . Difficulty of Paying Living Expenses: Somewhat hard  Food Insecurity: Food Insecurity Present  . Worried About Charity fundraiser in the Last Year: Often true  . Ran Out of Food in the Last Year: Never true  Transportation Needs: No Transportation Needs  . Lack of Transportation (Medical): No  . Lack of Transportation (Non-Medical): No  Physical Activity: Inactive  . Days of Exercise per Week: 0 days  . Minutes of Exercise per Session: 0 min  Stress: Stress Concern Present  . Feeling of Stress : To some extent  Social Connections: Somewhat Isolated  . Frequency of Communication with Friends and Family: More than three times a  week  . Frequency of Social Gatherings with Friends and Family: Twice a week  . Attends Religious Services: Never  . Active Member of Clubs or Organizations: No  . Attends Archivist Meetings: Never  . Marital Status: Living with partner     Family History:  The patient's family history includes Hypertension in his brother and father; Lung cancer in his maternal grandfather; Lung disease in his father.  ROS:   Review of Systems  Constitutional: Negative for chills, diaphoresis, fever, malaise/fatigue and weight loss.  HENT: Negative for congestion.   Eyes: Negative for discharge and redness.  Respiratory: Positive for shortness of breath. Negative for cough, sputum production  and wheezing.   Cardiovascular: Negative for chest pain, palpitations, orthopnea, claudication, leg swelling and PND.  Gastrointestinal: Negative for abdominal pain, heartburn, nausea and vomiting.  Musculoskeletal: Positive for back pain and neck pain. Negative for falls and myalgias.  Skin: Negative for rash.  Neurological: Negative for dizziness, tingling, tremors, sensory change, speech change, focal weakness, loss of consciousness and weakness.  Endo/Heme/Allergies: Does not bruise/bleed easily.  Psychiatric/Behavioral: Negative for substance abuse. The patient is not nervous/anxious.   All other systems reviewed and are negative.    EKGs/Labs/Other Studies Reviewed:    Studies reviewed were summarized above. The additional studies were reviewed today:  2D echo 08/04/2019: 1. Left ventricular ejection fraction, by estimation, is 65 to 70%. The  left ventricle has normal function. The left ventricle has no regional  wall motion abnormalities. Left ventricular diastolic parameters were  normal.  2. Right ventricular systolic function is normal. The right ventricular  size is normal. There is normal pulmonary artery systolic pressure.  3. The mitral valve is normal in structure. No evidence of  mitral valve  regurgitation. No evidence of mitral stenosis.  4. The aortic valve is normal in structure. Aortic valve regurgitation is  not visualized. No aortic stenosis is present.  5. The inferior vena cava is normal in size with greater than 50%  respiratory variability, suggesting right atrial pressure of 3 mmHg. __________  [facility] MPI 08/05/2019:  There was no ST segment deviation noted during stress.  No T wave inversion was noted during stress.  The study is normal.  This is a low risk study.  The left ventricular ejection fraction is normal (55-65%).  Suboptimal study due to GI.   EKG:  EKG is not ordered today.    Recent Labs: 05/30/2019: ALT 29; BUN 15; Creat 1.17; Hemoglobin 16.8; Platelets 281; Potassium 4.5; Sodium 137  Recent Lipid Panel    Component Value Date/Time   CHOL 219 (H) 05/30/2019 1108   TRIG 86 05/30/2019 1108   HDL 42 05/30/2019 1108   CHOLHDL 5.2 (H) 05/30/2019 1108   LDLCALC 158 (H) 05/30/2019 1108    PHYSICAL EXAM:    VS:  BP 120/78 (BP Location: Left Arm, Patient Position: Sitting, Cuff Size: Normal)   Pulse 80   Ht 6' (1.829 m)   Wt 208 lb (94.3 kg)   SpO2 96%   BMI 28.21 kg/m   BMI: Body mass index is 28.21 kg/m.  Physical Exam  Constitutional: He is oriented to person, place, and time. He appears well-developed and well-nourished.  HENT:  Head: Normocephalic and atraumatic.  Eyes: Right eye exhibits no discharge. Left eye exhibits no discharge.  Neck: No JVD present.  Cardiovascular: Normal rate, regular rhythm, S1 normal, S2 normal and normal heart sounds. Exam reveals no distant heart sounds, no friction rub, no midsystolic click and no opening snap.  No murmur heard. Pulses:      Posterior tibial pulses are 2+ on the right side and 2+ on the left side.  Pulmonary/Chest: Effort normal and breath sounds normal. No respiratory distress. He has no decreased breath sounds. He has no wheezes. He has no rales. He exhibits no  tenderness.  Abdominal: Soft. He exhibits no distension. There is no abdominal tenderness.  Musculoskeletal:        General: No edema.     Cervical back: Normal range of motion.  Neurological: He is alert and oriented to person, place, and time.  Skin: Skin is warm and dry. No cyanosis. Nails  show no clubbing.  Psychiatric: He has a normal mood and affect. His speech is normal and behavior is normal. Judgment and thought content normal.    Wt Readings from Last 3 Encounters:  08/08/19 208 lb (94.3 kg)  06/24/19 204 lb 6 oz (92.7 kg)  06/21/19 208 lb 9.6 oz (94.6 kg)     ASSESSMENT & PLAN:   1. Atypical chest tightness/shortness of breath: Symptoms are improving with management per pulmonology.  Reassuring cardiac work-up as outlined above.  Suspect symptoms of are secondary to underlying pulmonary disease which were exacerbated by recent suspected COVID-19 infection.  Discussion regarding primary prevention including continued smoking cessation and further management regarding his hyperlipidemia.  He prefers to continue lifestyle modification as outlined below.  No plans for further cardiac testing at this time.  2. Hyperlipidemia: LDL of 158 from 05/2019 with goal being less than 100.  He indicates he eats a large bowl of ice cream with 8 Oreos on a nightly basis.  He declines statin therapy at this time and prefers to work on lifestyle modification.  He will follow up with PCP for this.  Disposition: F/u with Dr. Saunders Revel or an APP as needed.   Medication Adjustments/Labs and Tests Ordered: Current medicines are reviewed at length with the patient today.  Concerns regarding medicines are outlined above. Medication changes, Labs and Tests ordered today are summarized above and listed in the Patient Instructions accessible in Encounters.   Signed, Christell Faith, PA-C 08/08/2019 12:09 PM     [address], [place] [address] [phone]

## 2019-08-03 NOTE — Telephone Encounter (Signed)
Nope I haven't seen anything for this patient

## 2019-08-04 ENCOUNTER — Other Ambulatory Visit: Payer: Self-pay

## 2019-08-04 ENCOUNTER — Ambulatory Visit (INDEPENDENT_AMBULATORY_CARE_PROVIDER_SITE_OTHER): Payer: Medicare Other

## 2019-08-04 DIAGNOSIS — R0602 Shortness of breath: Secondary | ICD-10-CM

## 2019-08-04 DIAGNOSIS — D235 Other benign neoplasm of skin of trunk: Secondary | ICD-10-CM | POA: Diagnosis not present

## 2019-08-04 DIAGNOSIS — R079 Chest pain, unspecified: Secondary | ICD-10-CM

## 2019-08-04 DIAGNOSIS — D225 Melanocytic nevi of trunk: Secondary | ICD-10-CM | POA: Diagnosis not present

## 2019-08-04 NOTE — Telephone Encounter (Signed)
CMN Signed and faxed on 08/04/2019. Confirmation received from [facility] that fax was successfully received. [name]

## 2019-08-04 NOTE — Telephone Encounter (Signed)
Nothing else needed at this time. [name]

## 2019-08-05 ENCOUNTER — Encounter
Admission: RE | Admit: 2019-08-05 | Discharge: 2019-08-05 | Disposition: A | Discharge status: Home / Self Care | Payer: Medicare Other | Source: Ambulatory Visit | Attending: Internal Medicine | Admitting: Internal Medicine

## 2019-08-05 DIAGNOSIS — R0602 Shortness of breath: Secondary | ICD-10-CM | POA: Diagnosis not present

## 2019-08-05 DIAGNOSIS — R079 Chest pain, unspecified: Secondary | ICD-10-CM | POA: Insufficient documentation

## 2019-08-05 LAB — NM MYOCAR MULTI W/SPECT W/WALL MOTION / EF
LV dias vol: 88 mL (ref 62–150)
LV sys vol: 30 mL
Peak HR: 92 {beats}/min
Percent HR: 56 %
Rest HR: 62 {beats}/min
SDS: 0
SRS: 3
SSS: 0
TID: 0.95

## 2019-08-05 MED ORDER — TECHNETIUM TC 99M TETROFOSMIN IV KIT
30.0000 | PACK | Freq: Once | INTRAVENOUS | Status: AC | PRN
  Administered 2019-08-05: 29.79 via INTRAVENOUS

## 2019-08-05 MED ORDER — REGADENOSON 0.4 MG/5ML IV SOLN
0.4000 mg | Freq: Once | INTRAVENOUS | Status: AC
  Administered 2019-08-05: 0.4 mg via INTRAVENOUS

## 2019-08-05 MED ORDER — TECHNETIUM TC 99M TETROFOSMIN IV KIT
10.0000 | PACK | Freq: Once | INTRAVENOUS | Status: AC | PRN
  Administered 2019-08-05: 10.57 via INTRAVENOUS

## 2019-08-08 ENCOUNTER — Ambulatory Visit: Payer: Medicare Other | Admitting: Physician Assistant

## 2019-08-08 ENCOUNTER — Encounter: Payer: Self-pay | Admitting: Physician Assistant

## 2019-08-08 ENCOUNTER — Other Ambulatory Visit: Payer: Self-pay

## 2019-08-08 VITALS — BP 120/78 | HR 80 | Ht 72.0 in | Wt 208.0 lb

## 2019-08-08 DIAGNOSIS — R0602 Shortness of breath: Secondary | ICD-10-CM | POA: Diagnosis not present

## 2019-08-08 DIAGNOSIS — R0789 Other chest pain: Secondary | ICD-10-CM | POA: Diagnosis not present

## 2019-08-08 DIAGNOSIS — E785 Hyperlipidemia, unspecified: Secondary | ICD-10-CM

## 2019-08-08 NOTE — Patient Instructions (Signed)
Medication Instructions:  Your physician recommends that you continue on your current medications as directed. Please refer to the Current Medication list given to you today.  *If you need a refill on your cardiac medications before your next appointment, please call your pharmacy*   Lab Work: None ordered  If you have labs (blood work) drawn today and your tests are completely normal, you will receive your results only by:  [place] (if you have MyChart) OR  A paper copy in the mail If you have any lab test that is abnormal or we need to change your treatment, we will call you to review the results.   Testing/Procedures: None ordered    Follow-Up: At [facility], you and your health needs are our priority.  As part of our continuing mission to provide you with exceptional heart care, we have created designated Provider Care Teams.  These Care Teams include your primary Cardiologist (physician) and Advanced Practice Providers (APPs -  Physician Assistants and Nurse Practitioners) who all work together to provide you with the care you need, when you need it.  We recommend signing up for the patient portal called "MyChart".  Sign up information is provided on this After Visit Summary.  MyChart is used to connect with patients for Virtual Visits (Telemedicine).  Patients are able to view lab/test results, encounter notes, upcoming appointments, etc.  Non-urgent messages can be sent to your provider as well.   To learn more about what you can do with MyChart, go to [email or web address].    Your next appointment:   As needed

## 2019-08-14 DIAGNOSIS — J449 Chronic obstructive pulmonary disease, unspecified: Secondary | ICD-10-CM | POA: Diagnosis not present

## 2019-08-30 ENCOUNTER — Ambulatory Visit (INDEPENDENT_AMBULATORY_CARE_PROVIDER_SITE_OTHER): Payer: Medicare Other | Admitting: Family Medicine

## 2019-08-30 ENCOUNTER — Other Ambulatory Visit: Payer: Self-pay

## 2019-08-30 ENCOUNTER — Encounter: Payer: Self-pay | Admitting: Family Medicine

## 2019-08-30 VITALS — BP 132/78 | HR 73 | Temp 97.9°F | Resp 16 | Ht 72.0 in | Wt 205.0 lb

## 2019-08-30 DIAGNOSIS — G8929 Other chronic pain: Secondary | ICD-10-CM

## 2019-08-30 DIAGNOSIS — F1921 Other psychoactive substance dependence, in remission: Secondary | ICD-10-CM

## 2019-08-30 DIAGNOSIS — M170 Bilateral primary osteoarthritis of knee: Secondary | ICD-10-CM

## 2019-08-30 DIAGNOSIS — G43011 Migraine without aura, intractable, with status migrainosus: Secondary | ICD-10-CM | POA: Diagnosis not present

## 2019-08-30 DIAGNOSIS — F325 Major depressive disorder, single episode, in full remission: Secondary | ICD-10-CM

## 2019-08-30 DIAGNOSIS — I700 Atherosclerosis of aorta: Secondary | ICD-10-CM

## 2019-08-30 DIAGNOSIS — G4700 Insomnia, unspecified: Secondary | ICD-10-CM | POA: Diagnosis not present

## 2019-08-30 DIAGNOSIS — M542 Cervicalgia: Secondary | ICD-10-CM

## 2019-08-30 DIAGNOSIS — Z85820 Personal history of malignant melanoma of skin: Secondary | ICD-10-CM

## 2019-08-30 DIAGNOSIS — M549 Dorsalgia, unspecified: Secondary | ICD-10-CM

## 2019-08-30 DIAGNOSIS — J449 Chronic obstructive pulmonary disease, unspecified: Secondary | ICD-10-CM

## 2019-08-30 DIAGNOSIS — J438 Other emphysema: Secondary | ICD-10-CM

## 2019-08-30 DIAGNOSIS — R454 Irritability and anger: Secondary | ICD-10-CM

## 2019-08-30 MED ORDER — TRELEGY ELLIPTA 100-62.5-25 MCG/INH IN AEPB: 1.0000 | INHALATION_SPRAY | Freq: Every day | RESPIRATORY_TRACT | 1 refills | Status: DC

## 2019-08-30 MED ORDER — ALBUTEROL SULFATE HFA 108 (90 BASE) MCG/ACT IN AERS: 2.0000 | INHALATION_SPRAY | Freq: Four times a day (QID) | RESPIRATORY_TRACT | 0 refills | Status: DC | PRN

## 2019-08-30 MED ORDER — RIZATRIPTAN BENZOATE 10 MG PO TABS: 10.0000 mg | ORAL_TABLET | ORAL | 0 refills | Status: DC | PRN

## 2019-08-30 MED ORDER — PREGABALIN 100 MG PO CAPS: 100.0000 mg | ORAL_CAPSULE | Freq: Two times a day (BID) | ORAL | 1 refills | Status: DC

## 2019-08-30 MED ORDER — DULOXETINE HCL 60 MG PO CPEP: 60.0000 mg | ORAL_CAPSULE | Freq: Every day | ORAL | 1 refills | Status: DC

## 2019-08-30 MED ORDER — HYDROXYZINE HCL 10 MG PO TABS: 10.0000 mg | ORAL_TABLET | Freq: Three times a day (TID) | ORAL | 1 refills | Status: DC | PRN

## 2019-08-30 MED ORDER — ATORVASTATIN CALCIUM 10 MG PO TABS: 10.0000 mg | ORAL_TABLET | Freq: Every day | ORAL | 1 refills | Status: DC

## 2019-08-30 NOTE — Progress Notes (Signed)
Name: Ronald Ellis   MRN: [id number]    DOB: 1960/06/22   Date:08/30/2019       Progress Note  Subjective  Chief Complaint  Chief Complaint  Patient presents with  . Back Pain  . Knee Pain  . Follow-up    HPI  Intermittent chest pain: seen by Dr. Saunders Revel, he is doing much better, states sob also improvement   Stress Myoview done 08/05/2019 :    There was no ST segment deviation noted during stress.   No T wave inversion was noted during stress.   The study is normal.   This is a low risk study.   The left ventricular ejection fraction is normal (55-65%).   Suboptimal study due to GI.    Echocardiogram done 08/04/2019  1. Left ventricular ejection fraction, by estimation, is 65 to 70%. The left ventricle has normal function. The left ventricle has no regional wall motion abnormalities. Left ventricular diastolic parameters were normal. 2. Right ventricular systolic function is normal. The right ventricular size is normal. There is normal pulmonary artery systolic pressure. 3. The mitral valve is normal in structure. No evidence of mitral valve regurgitation. No evidence of mitral stenosis. 4. The aortic valve is normal in structure. Aortic valve regurgitation is not visualized. No aortic stenosis is present. 5. The inferior vena cava is normal in size with greater than 50% respiratory variability, suggesting right atrial pressure of 3 mmHg   COPD: stopped smoking cigars and cough has improved, he was seen by Dr. Mortimer Fries, had a 6 minute walk test but I cannot see the result. He is now on 2 liters of nocturnal oxygen, he states he is doing better, still has morning cough that is productive, but able to walk more and sob is not as intense  Melanoma in site: seeing Dr. Phillip Heal, removed and had Mohl't surgery  .He also had some other pre-cancerous lesions that were removed   Headache: he continues to have a dull headache daily when he wakes up but once or twice a week he has a  migraine, associated with nausea, throbbing pain, behind right eye and frontal area, he states pain causes vomiting at times, he states maxalt decreases the intensity down to a minimum and he is able to function   Chronic neck and back pain: he has an episode of radiculitis in March with pain radiating down both legs, doing well on Lyrica, pain is more dull and constant, feels stiff, worse at the end of the day, aggravated with shifting position. Usually 2-3/10 throughout the day but goes up to 6-7 /10 at the end of the day   MDD: phq 9 was high earlier this year,he is doing better now, taking medication, but stopped Abilify , mood is better, going for motorcycle rides and the longer days are helpful to his mood.   History of drug use: when he was an young adult, he got married at age 58 , had 2 kids, when he got a divorce at age 49 , he went wild , he did drugs until age 16, he states the reason he quit was because he did a big snort of cocaine, he fell forward and hit his head , he had a laceration and decided not to do it again.   Atherosclerosis of aorta: he is willing to try atorvastatin and aspirin 81 mg   Patient Active Problem List   Diagnosis Date Noted  . History of malignant melanoma of skin 08/03/2019  . Chest  pain of uncertain etiology 16/12/9602  . Shortness of breath 06/24/2019  . Atherosclerosis of aorta ([place]) 12/17/2018  . Personal history of tobacco use, presenting hazards to health 12/16/2018  . History of drug dependence/abuse ([place]) 11/26/2018  . Celiac disease 02/05/2018  . COPD (chronic obstructive pulmonary disease) ([place]) 06/29/2017  . Right-sided low back pain with right-sided sciatica 12/05/2015  . Plantar fasciitis of right foot 10/15/2015  . Chronic neck and back pain 07/19/2015  . Fatigue 07/19/2015    Past Surgical History:  Procedure Laterality Date  . BACK SURGERY  2015  . BACK SURGERY    . INGUINAL HERNIA REPAIR Bilateral    multiple times  . lumbar  discetomy  2005  . MOLE REMOVAL    . NECK SURGERY  2016    Family History  Problem Relation Age of Onset  . Hypertension Father   . Lung disease Father   . Hypertension Brother   . Lung cancer Maternal Grandfather     Social History   Tobacco Use  . Smoking status: Former Smoker    Packs/day: 1.00    Years: 42.00    Pack years: 42.00    Types: Cigarettes    Start date: 50    Quit date: 05/17/2019    Years since quitting: 0.2  . Smokeless tobacco: Never Used  Substance Use Topics  . Alcohol use: Not Currently    Comment: quit 5-6 years ago, only beer - but glutten free diet now     Current Outpatient Medications:  .  albuterol (PROVENTIL HFA;VENTOLIN HFA) 108 (90 Base) MCG/ACT inhaler, Inhale 2 puffs into the lungs every 6 (six) hours as needed for wheezing or shortness of breath., Disp: 1 Inhaler, Rfl: 0 .  Ascorbic Acid (VITAMIN C PO), Take by mouth., Disp: , Rfl:  .  b complex vitamins tablet, Take 1 tablet by mouth daily., Disp: , Rfl:  .  benzonatate (TESSALON) 200 MG capsule, Take 1 capsule (200 mg total) by mouth 3 (three) times daily as needed., Disp: 100 capsule, Rfl: 0 .  BIOTIN PO, Take by mouth., Disp: , Rfl:  .  DULoxetine (CYMBALTA) 60 MG capsule, Take 1 capsule (60 mg total) by mouth daily., Disp: 90 capsule, Rfl: 1 .  fluticasone (FLONASE) 50 MCG/ACT nasal spray, Place 2 sprays into both nostrils daily., Disp: 16 g, Rfl: 6 .  hydrOXYzine (ATARAX/VISTARIL) 10 MG tablet, Take 1 tablet (10 mg total) by mouth 3 (three) times daily as needed., Disp: 90 tablet, Rfl: 2 .  MAGNESIUM PO, Take by mouth., Disp: , Rfl:  .  pregabalin (LYRICA) 100 MG capsule, Take 1 capsule (100 mg total) by mouth 3 (three) times daily., Disp: 270 capsule, Rfl: 0 .  rizatriptan (MAXALT) 10 MG tablet, Take 1 tablet (10 mg total) by mouth as needed for migraine. May repeat in 2 hours if needed, Disp: 10 tablet, Rfl: 0 .  SPIRIVA HANDIHALER 18 MCG inhalation capsule, INHALE THE CONTENTS OF  1 CAPSULE VIA HANDIHALER DAILY, Disp: 90 capsule, Rfl: 0 .  tiZANidine (ZANAFLEX) 4 MG tablet, Take 1 tablet (4 mg total) by mouth at bedtime., Disp: 90 tablet, Rfl: 1 .  traZODone (DESYREL) 100 MG tablet, Take 1 tablet (100 mg total) by mouth at bedtime. Pt taking 2 tablets qhs, Disp: 90 tablet, Rfl: 0 .  ARIPiprazole (ABILIFY) 5 MG tablet, TAKE 1 TABLET(5 MG) BY MOUTH DAILY (Patient not taking: Reported on 08/30/2019), Disp: 90 tablet, Rfl: 0 .  fluticasone furoate-vilanterol (BREO ELLIPTA)  100-25 MCG/INH AEPB, Inhale 1 puff into the lungs daily. (Patient not taking: Reported on 08/30/2019), Disp: 180 each, Rfl: 0 .  terbinafine (LAMISIL) 250 MG tablet, Take 1 tablet (250 mg total) by mouth daily. (Patient not taking: Reported on 08/30/2019), Disp: 90 tablet, Rfl: 0  Allergies  Allergen Reactions  . Gluten Meal     I personally reviewed active problem list, medication list, allergies, family history, social history, health maintenance with the patient/caregiver today.   ROS  Constitutional: Negative for fever or weight change.  Respiratory: Positve for cough and shortness of breath.   Cardiovascular: Negative for chest pain or palpitations.  Gastrointestinal: Negative for abdominal pain, no bowel changes.  Musculoskeletal: Negative for gait problem or joint swelling.  Skin: Negative for rash.  Neurological: Negative for dizziness , positive for  headache.  No other specific complaints in a complete review of systems (except as listed in HPI above).  Objective  Vitals:   08/30/19 0933  BP: 132/78  Pulse: 73  Resp: 16  Temp: 97.9 F (36.6 C)  TempSrc: Temporal  SpO2: 96%  Weight: 205 lb (93 kg)  Height: 6' (1.829 m)    Body mass index is 27.8 kg/m.  Physical Exam  Constitutional: Patient appears well-developed and well-nourished. Overweight. No distress.  HEENT: head atraumatic, normocephalic, pupils equal and reactive to light,  neck supple, Cardiovascular: Normal rate,  regular rhythm and normal heart sounds.  No murmur heard. No BLE edema. Pulmonary/Chest: Effort normal and breath sounds normal. No respiratory distress. Abdominal: Soft.  There is no tenderness. Muscular Skeletal :tender during palpation of lumbar spine and also with rom of neck  Psychiatric: Patient has a normal mood and affect. behavior is normal. Judgment and thought content normal.  Recent Results (from the past 2160 hour(s))  SARS CORONAVIRUS 2 (TAT 6-24 HRS) Nasopharyngeal Nasopharyngeal Swab     Status: None   Collection Time: 06/24/19  8:42 AM   Specimen: Nasopharyngeal Swab  Result Value Ref Range   SARS Coronavirus 2 NEGATIVE NEGATIVE    Comment: (NOTE) SARS-CoV-2 target nucleic acids are NOT DETECTED. The SARS-CoV-2 RNA is generally detectable in upper and lower respiratory specimens during the acute phase of infection. Negative results do not preclude SARS-CoV-2 infection, do not rule out co-infections with other pathogens, and should not be used as the sole basis for treatment or other patient management decisions. Negative results must be combined with clinical observations, patient history, and epidemiological information. The expected result is Negative. Fact Sheet for Patients: SugarRoll.be Fact Sheet for Healthcare Providers: https://www.woods-mathews.com/ This test is not yet approved or cleared by the [place] FDA and  has been authorized for detection and/or diagnosis of SARS-CoV-2 by FDA under an Emergency Use Authorization (EUA). This EUA will remain  in effect (meaning this test can be used) for the duration of the COVID-19 declaration under Section 56 4(b)(1) of the Act, 21 U.S.C. section 360bbb-3(b)(1), unless the authorization is terminated or revoked sooner. Performed at [facility], [place] [address], [place], [place] [address]   NM Myocar Multi W/Spect Tamela Oddi Motion / EF     Status: None   Collection  Time: 08/05/19 11:27 AM  Result Value Ref Range   Rest HR 62 bpm   Rest BP 135/92 mmHg   Percent HR 56 %   Peak HR 92 bpm   Peak BP 135/88 mmHg   SSS 0    SRS 3    SDS 0    TID  0.95    LV sys vol 30 mL   LV dias vol 88 62 - 150 mL     PHQ2/9: Depression screen [facility] 2/9 08/30/2019 05/30/2019 04/27/2019 03/30/2019 01/14/2019  Decreased Interest 0 1 1 3 1   Down, Depressed, Hopeless 0 1 1 1 1   PHQ - 2 Score 0 2 2 4 2   Altered sleeping 0 2 1 3 1   Tired, decreased energy 0 2 1 3 2   Change in appetite 0 2 1 1  0  Feeling bad or failure about yourself  0 1 0 1 0  Trouble concentrating 0 1 1 1  0  Moving slowly or fidgety/restless 0 0 0 1 0  Suicidal thoughts 0 0 0 0 0  PHQ-9 Score 0 10 6 14 5   Difficult doing work/chores - Somewhat difficult Somewhat difficult Very difficult Somewhat difficult  Some recent data might be hidden    phq 9 is negative   Fall Risk: Fall Risk  08/30/2019 05/30/2019 04/27/2019 03/30/2019 01/14/2019  Falls in the past year? 0 1 1 1 1   Number falls in past yr: 0 0 0 0 1  Injury with Fall? 0 0 0 0 0  Risk for fall due to : - - - - Impaired balance/gait;History of fall(s)  Follow up - - - - Falls prevention discussed     Functional Status Survey: Is the patient deaf or have difficulty hearing?: No Does the patient have difficulty seeing, even when wearing glasses/contacts?: No Does the patient have difficulty concentrating, remembering, or making decisions?: No Does the patient have difficulty walking or climbing stairs?: Yes Does the patient have difficulty dressing or bathing?: No Does the patient have difficulty doing errands alone such as visiting a doctor's office or shopping?: No    Assessment & Plan  1. Chronic obstructive pulmonary disease, unspecified COPD type ([place])  - Fluticasone-Umeclidin-Vilant (TRELEGY ELLIPTA) 100-62.5-25 MCG/INH AEPB; Inhale 1 puff into the lungs daily.  Dispense: 180 each; Refill: 1  2. Intractable migraine without  aura and with status migrainosus  - rizatriptan (MAXALT) 10 MG tablet; Take 1 tablet (10 mg total) by mouth as needed for migraine. May repeat in 2 hours if needed  Dispense: 10 tablet; Refill: 0 - pregabalin (LYRICA) 100 MG capsule; Take 1 capsule (100 mg total) by mouth 2 (two) times daily.  Dispense: 180 capsule; Refill: 1  3. Chronic neck and back pain  - DULoxetine (CYMBALTA) 60 MG capsule; Take 1 capsule (60 mg total) by mouth daily.  Dispense: 90 capsule; Refill: 1  4. Insomnia, unspecified type   5. Major depression in remission  (H[facility])  - hydrOXYzine (ATARAX/VISTARIL) 10 MG tablet; Take 1 tablet (10 mg total) by mouth 3 (three) times daily as needed.  Dispense: 90 tablet; Refill: 1  6. Outbursts of anger  - hydrOXYzine (ATARAX/VISTARIL) 10 MG tablet; Take 1 tablet (10 mg total) by mouth 3 (three) times daily as needed.  Dispense: 90 tablet; Refill: 1  7. Other emphysema (HCC)  - albuterol (VENTOLIN HFA) 108 (90 Base) MCG/ACT inhaler; Inhale 2 puffs into the lungs every 6 (six) hours as needed for wheezing or shortness of breath.  Dispense: 18 g; Refill: 0  8. History of drug dependence/abuse ([place])  In remission   9. Atherosclerosis of aorta (HCC)  - atorvastatin (LIPITOR) 10 MG tablet; Take 1 tablet (10 mg total) by mouth daily.  Dispense: 90 tablet; Refill: 1  10. History of malignant melanoma of skin  Keep follow up with Dr.  Phillip Heal   11. Primary osteoarthritis of both knees  Reassurance, increase physical activity

## 2019-09-13 DIAGNOSIS — J449 Chronic obstructive pulmonary disease, unspecified: Secondary | ICD-10-CM | POA: Diagnosis not present

## 2019-10-14 DIAGNOSIS — J449 Chronic obstructive pulmonary disease, unspecified: Secondary | ICD-10-CM | POA: Diagnosis not present

## 2019-11-09 DIAGNOSIS — T1502XA Foreign body in cornea, left eye, initial encounter: Secondary | ICD-10-CM | POA: Diagnosis not present

## 2019-11-14 DIAGNOSIS — J449 Chronic obstructive pulmonary disease, unspecified: Secondary | ICD-10-CM | POA: Diagnosis not present

## 2019-12-06 ENCOUNTER — Other Ambulatory Visit: Payer: Self-pay | Admitting: *Deleted

## 2019-12-06 DIAGNOSIS — Z122 Encounter for screening for malignant neoplasm of respiratory organs: Secondary | ICD-10-CM

## 2019-12-06 DIAGNOSIS — Z87891 Personal history of nicotine dependence: Secondary | ICD-10-CM

## 2019-12-06 NOTE — Progress Notes (Signed)
Former smoker, quit 01/02/12, 40 pack year

## 2019-12-14 DIAGNOSIS — J449 Chronic obstructive pulmonary disease, unspecified: Secondary | ICD-10-CM | POA: Diagnosis not present

## 2019-12-16 ENCOUNTER — Ambulatory Visit
Admission: RE | Admit: 2019-12-16 | Discharge: 2019-12-16 | Disposition: A | Discharge status: Home / Self Care | Payer: Medicare Other | Source: Ambulatory Visit | Attending: Oncology | Admitting: Oncology

## 2019-12-16 ENCOUNTER — Other Ambulatory Visit: Payer: Self-pay

## 2019-12-16 DIAGNOSIS — Z122 Encounter for screening for malignant neoplasm of respiratory organs: Secondary | ICD-10-CM

## 2019-12-16 DIAGNOSIS — Z87891 Personal history of nicotine dependence: Secondary | ICD-10-CM | POA: Diagnosis not present

## 2019-12-20 ENCOUNTER — Encounter: Payer: Self-pay | Admitting: *Deleted

## 2019-12-26 ENCOUNTER — Ambulatory Visit: Payer: Medicare Other | Admitting: Family Medicine

## 2019-12-26 DIAGNOSIS — L700 Acne vulgaris: Secondary | ICD-10-CM | POA: Diagnosis not present

## 2019-12-26 DIAGNOSIS — L918 Other hypertrophic disorders of the skin: Secondary | ICD-10-CM | POA: Diagnosis not present

## 2019-12-26 DIAGNOSIS — Z85820 Personal history of malignant melanoma of skin: Secondary | ICD-10-CM | POA: Diagnosis not present

## 2019-12-26 DIAGNOSIS — Z86018 Personal history of other benign neoplasm: Secondary | ICD-10-CM | POA: Diagnosis not present

## 2019-12-26 DIAGNOSIS — L578 Other skin changes due to chronic exposure to nonionizing radiation: Secondary | ICD-10-CM | POA: Diagnosis not present

## 2020-01-02 ENCOUNTER — Ambulatory Visit (INDEPENDENT_AMBULATORY_CARE_PROVIDER_SITE_OTHER): Payer: Medicare Other | Admitting: Family Medicine

## 2020-01-02 ENCOUNTER — Other Ambulatory Visit: Payer: Self-pay

## 2020-01-02 ENCOUNTER — Encounter: Payer: Self-pay | Admitting: Family Medicine

## 2020-01-02 VITALS — BP 128/72 | HR 70 | Temp 98.3°F | Resp 18 | Ht 72.0 in | Wt 200.8 lb

## 2020-01-02 DIAGNOSIS — R454 Irritability and anger: Secondary | ICD-10-CM | POA: Diagnosis not present

## 2020-01-02 DIAGNOSIS — M170 Bilateral primary osteoarthritis of knee: Secondary | ICD-10-CM | POA: Diagnosis not present

## 2020-01-02 DIAGNOSIS — F321 Major depressive disorder, single episode, moderate: Secondary | ICD-10-CM

## 2020-01-02 DIAGNOSIS — G4700 Insomnia, unspecified: Secondary | ICD-10-CM | POA: Diagnosis not present

## 2020-01-02 DIAGNOSIS — F338 Other recurrent depressive disorders: Secondary | ICD-10-CM

## 2020-01-02 DIAGNOSIS — Z23 Encounter for immunization: Secondary | ICD-10-CM | POA: Diagnosis not present

## 2020-01-02 DIAGNOSIS — F1921 Other psychoactive substance dependence, in remission: Secondary | ICD-10-CM

## 2020-01-02 DIAGNOSIS — J441 Chronic obstructive pulmonary disease with (acute) exacerbation: Secondary | ICD-10-CM

## 2020-01-02 DIAGNOSIS — G43001 Migraine without aura, not intractable, with status migrainosus: Secondary | ICD-10-CM

## 2020-01-02 DIAGNOSIS — M542 Cervicalgia: Secondary | ICD-10-CM

## 2020-01-02 DIAGNOSIS — J432 Centrilobular emphysema: Secondary | ICD-10-CM

## 2020-01-02 DIAGNOSIS — Z79899 Other long term (current) drug therapy: Secondary | ICD-10-CM

## 2020-01-02 DIAGNOSIS — G8929 Other chronic pain: Secondary | ICD-10-CM

## 2020-01-02 DIAGNOSIS — M549 Dorsalgia, unspecified: Secondary | ICD-10-CM

## 2020-01-02 DIAGNOSIS — Z85820 Personal history of malignant melanoma of skin: Secondary | ICD-10-CM

## 2020-01-02 DIAGNOSIS — I700 Atherosclerosis of aorta: Secondary | ICD-10-CM

## 2020-01-02 DIAGNOSIS — E785 Hyperlipidemia, unspecified: Secondary | ICD-10-CM

## 2020-01-02 MED ORDER — BREO ELLIPTA 100-25 MCG/INH IN AEPB: 1.0000 | INHALATION_SPRAY | Freq: Every day | RESPIRATORY_TRACT | 1 refills | Status: DC

## 2020-01-02 MED ORDER — RIZATRIPTAN BENZOATE 10 MG PO TABS: 10.0000 mg | ORAL_TABLET | ORAL | 1 refills | Status: DC | PRN

## 2020-01-02 MED ORDER — SPIRIVA HANDIHALER 18 MCG IN CAPS: 18.0000 ug | ORAL_CAPSULE | Freq: Every day | RESPIRATORY_TRACT | 1 refills | Status: DC

## 2020-01-02 MED ORDER — ROSUVASTATIN CALCIUM 5 MG PO TABS: 5.0000 mg | ORAL_TABLET | Freq: Every day | ORAL | 1 refills | Status: DC

## 2020-01-02 MED ORDER — AMOXICILLIN-POT CLAVULANATE 875-125 MG PO TABS: 1.0000 | ORAL_TABLET | Freq: Two times a day (BID) | ORAL | 0 refills | Status: DC

## 2020-01-02 MED ORDER — ARIPIPRAZOLE 5 MG PO TABS: 5.0000 mg | ORAL_TABLET | Freq: Every day | ORAL | 0 refills | Status: DC

## 2020-01-02 MED ORDER — PREGABALIN 100 MG PO CAPS: 100.0000 mg | ORAL_CAPSULE | Freq: Two times a day (BID) | ORAL | 1 refills | Status: DC

## 2020-01-02 MED ORDER — HYDROXYZINE HCL 10 MG PO TABS: 10.0000 mg | ORAL_TABLET | Freq: Three times a day (TID) | ORAL | 1 refills | Status: DC | PRN

## 2020-01-02 MED ORDER — PREDNISONE 10 MG PO TABS: 10.0000 mg | ORAL_TABLET | Freq: Two times a day (BID) | ORAL | 0 refills | Status: DC

## 2020-01-02 MED ORDER — DULOXETINE HCL 60 MG PO CPEP: 60.0000 mg | ORAL_CAPSULE | Freq: Every day | ORAL | 1 refills | Status: DC

## 2020-01-02 NOTE — Progress Notes (Signed)
Name: Ronald Ellis   MRN: [id number]    DOB: May 21, 1960   Date:01/02/2020       Progress Note  Subjective  Chief Complaint  Follow up   HPI   Intermittent chest pain: seen by Dr. Saunders Revel, he is doing much better, states sob also improvement ,he stopped taking Atorvastatin because it caused muscle aches   Stress Myoview done 08/05/2019 :    There was no ST segment deviation noted during stress.   No T wave inversion was noted during stress.   The study is normal.   This is a low risk study.   The left ventricular ejection fraction is normal (55-65%).   Suboptimal study due to GI.    Echocardiogram done 08/04/2019  1. Left ventricular ejection fraction, by estimation, is 65 to 70%. The left ventricle has normal function. The left ventricle has no regional wall motion abnormalities. Left ventricular diastolic parameters were normal. 2. Right ventricular systolic function is normal. The right ventricular size is normal. There is normal pulmonary artery systolic pressure. 3. The mitral valve is normal in structure. No evidence of mitral valve regurgitation. No evidence of mitral stenosis. 4. The aortic valve is normal in structure. Aortic valve regurgitation is not visualized. No aortic stenosis is present. 5. The inferior vena cava is normal in size with greater than 50% respiratory variability, suggesting right atrial pressure of 3 mmHg   COPD: he smoked cigarettes for about 40 years one pack daily but quit in 2013 , he quit smoking cigars in 2020.Marland Kitchen He was seen by Dr. Mortimer Fries. He is now on 2 liters of nocturnal oxygen, he states he had a recent CT chest that showed interstitial pneumonitis . He noticed increase in productive cough, yellow sputum, feeling tired, wheezing over the past few weeks. Low grave fever 99.2 , and occasional chills.   Last CT lung done 12/16/2019  IMPRESSION:  1. Lung-RADS 2, benign appearance or behavior. Continue annual  screening with low-dose chest CT  without contrast in 12 months.  2. Similar appearance of interstitial lung disease, with  differential considerations of nonspecific interstitial pneumonitis  and mild usual interstitial pneumonitis.  3. Aortic Atherosclerosis (ICD10-I70.0) and Emphysema (ICD10-J43.9).   Melanoma in site: seeing Dr. Phillip Heal, removed and had Mohl't surgery on his neck .He also had some other pre-cancerous lesions that were removed on left arm and back He is up to date with full body scan   Headache: he continues to have a dull headache daily when he wakes up but once or twice a week he has a migraine, associated with nausea, throbbing pain, behind right eye and frontal area, he states pain causes vomiting at times, he states maxalt decreases the intensity down to a minimum and he is able to function , he needs refills.   Chronic neck and back pain: he has an episode of radiculitis in March with pain radiating down both legs, doing well on Lyrica, pain is more dull and constant, feels stiff, worse at the end of the day, aggravated with shifting position. Usually 2-3/10 throughout the day but goes up to 6-7 /10 at the end of the day   MDD/seasonal affective disorder: phq 9 improved so he stopped Abilify but is feeling down again, he also feels more snappy than usual. He will resume Abilify, he is on duloxetine and also hydroxyzine prn   History of drug use: when he was an young adult, he got married at age 69 , had 2 kids, when  he got a divorce at age 47 , he went wild , he did drugs until age 71, he states the reason he quit was because he did a big snort of cocaine, he fell forward and hit his head , he had a laceration and decided not to do it again. Unchanged   Atherosclerosis of aorta: he tried atorvastatin but caused body aches, we will try crestor instead    Patient Active Problem List   Diagnosis Date Noted  . History of malignant melanoma of skin 08/03/2019  . Chest pain of uncertain etiology [id number]   . Shortness of breath 06/24/2019  . Atherosclerosis of aorta ([place]) 12/17/2018  . Personal history of tobacco use, presenting hazards to health 12/16/2018  . History of drug dependence/abuse ([place]) 11/26/2018  . Celiac disease 02/05/2018  . COPD (chronic obstructive pulmonary disease) ([place]) 06/29/2017  . Right-sided low back pain with right-sided sciatica 12/05/2015  . Plantar fasciitis of right foot 10/15/2015  . Chronic neck and back pain 07/19/2015  . Fatigue 07/19/2015    Past Surgical History:  Procedure Laterality Date  . BACK SURGERY  2015  . BACK SURGERY    . INGUINAL HERNIA REPAIR Bilateral    multiple times  . lumbar discetomy  2005  . MOLE REMOVAL    . NECK SURGERY  2016    Family History  Problem Relation Age of Onset  . Hypertension Father   . Lung disease Father   . Hypertension Brother   . Lung cancer Maternal Grandfather     Social History   Tobacco Use  . Smoking status: Former Smoker    Packs/day: 1.00    Years: 42.00    Pack years: 42.00    Types: Cigarettes    Start date: 15    Quit date: 05/17/2019    Years since quitting: 0.6  . Smokeless tobacco: Never Used  Substance Use Topics  . Alcohol use: Not Currently    Comment: quit 5-6 years ago, only beer - but glutten free diet now     Current Outpatient Medications:  .  albuterol (VENTOLIN HFA) 108 (90 Base) MCG/ACT inhaler, Inhale 2 puffs into the lungs every 6 (six) hours as needed for wheezing or shortness of breath., Disp: 18 g, Rfl: 0 .  Ascorbic Acid (VITAMIN C PO), Take by mouth., Disp: , Rfl:  .  b complex vitamins tablet, Take 1 tablet by mouth daily., Disp: , Rfl:  .  benzonatate (TESSALON) 200 MG capsule, Take 1 capsule (200 mg total) by mouth 3 (three) times daily as needed., Disp: 100 capsule, Rfl: 0 .  BIOTIN PO, Take by mouth., Disp: , Rfl:  .  DULoxetine (CYMBALTA) 60 MG capsule, Take 1 capsule (60 mg total) by mouth daily., Disp: 90 capsule, Rfl: 1 .  fluticasone (FLONASE)  50 MCG/ACT nasal spray, Place 2 sprays into both nostrils daily., Disp: 16 g, Rfl: 6 .  hydrOXYzine (ATARAX/VISTARIL) 10 MG tablet, Take 1 tablet (10 mg total) by mouth 3 (three) times daily as needed., Disp: 90 tablet, Rfl: 1 .  MAGNESIUM PO, Take by mouth., Disp: , Rfl:  .  pregabalin (LYRICA) 100 MG capsule, Take 1 capsule (100 mg total) by mouth 2 (two) times daily., Disp: 180 capsule, Rfl: 1 .  rizatriptan (MAXALT) 10 MG tablet, Take 1 tablet (10 mg total) by mouth as needed for migraine. May repeat in 2 hours if needed, Disp: 10 tablet, Rfl: 1 .  tiZANidine (ZANAFLEX) 4 MG tablet, Take 1  tablet (4 mg total) by mouth at bedtime., Disp: 90 tablet, Rfl: 1 .  traZODone (DESYREL) 100 MG tablet, Take 1 tablet (100 mg total) by mouth at bedtime. Pt taking 2 tablets qhs, Disp: 90 tablet, Rfl: 0 .  amoxicillin-clavulanate (AUGMENTIN) 875-125 MG tablet, Take 1 tablet by mouth 2 (two) times daily., Disp: 20 tablet, Rfl: 0 .  ARIPiprazole (ABILIFY) 5 MG tablet, Take 1 tablet (5 mg total) by mouth daily., Disp: 90 tablet, Rfl: 0 .  fluticasone furoate-vilanterol (BREO ELLIPTA) 100-25 MCG/INH AEPB, Inhale 1 puff into the lungs daily., Disp: 180 each, Rfl: 1 .  predniSONE (DELTASONE) 10 MG tablet, Take 1 tablet (10 mg total) by mouth 2 (two) times daily with a meal., Disp: 10 tablet, Rfl: 0 .  rosuvastatin (CRESTOR) 5 MG tablet, Take 1 tablet (5 mg total) by mouth daily., Disp: 90 tablet, Rfl: 1 .  tiotropium (SPIRIVA HANDIHALER) 18 MCG inhalation capsule, Place 1 capsule (18 mcg total) into inhaler and inhale daily., Disp: 90 capsule, Rfl: 1  Allergies  Allergen Reactions  . Gluten Meal     I personally reviewed active problem list, medication list, allergies, family history, social history, health maintenance with the patient/caregiver today.   ROS  Constitutional: Negative for fever or weight change.  Respiratory: Positive  for cough and shortness of breath.   Cardiovascular: Negative for chest  pain or palpitations.  Gastrointestinal: Negative for abdominal pain, no bowel changes.  Musculoskeletal: Negative for gait problem or joint swelling.  Skin: Negative for rash.  Neurological: Negative for dizziness , positive for headache.  No other specific complaints in a complete review of systems (except as listed in HPI above).  Objective  Vitals:   01/02/20 0848 01/02/20 0854  BP:  128/72  Pulse:  70  Resp:  18  Temp:  98.3 F (36.8 C)  TempSrc:  Oral  SpO2:  99%  Weight:  200 lb 12.8 oz (91.1 kg)  Height: 6' (1.829 m) 6' (1.829 m)    Body mass index is 27.23 kg/m.  Physical Exam   Constitutional: Patient appears well-developed and well-nourished. Overweight.  No distress.  HEENT: head atraumatic, normocephalic, pupils equal and reactive to light,  neck supple Cardiovascular: Normal rate, regular rhythm and normal heart sounds.  No murmur heard. No BLE edema. Pulmonary/Chest: Effort normal and coughing , end inspiratory wheezing that resolved with deep inspiration . No respiratory distress.  Abdominal: Soft.  There is no tenderness. Psychiatric: Patient has a normal mood and affect. behavior is normal. Judgment and thought content normal.  PHQ2/9: Depression screen [facility] 2/9 01/02/2020 08/30/2019 05/30/2019 04/27/2019 03/30/2019  Decreased Interest 1 0 1 1 3   Down, Depressed, Hopeless 1 0 1 1 1   PHQ - 2 Score 2 0 2 2 4   Altered sleeping 1 0 2 1 3   Tired, decreased energy 1 0 2 1 3   Change in appetite 1 0 2 1 1   Feeling bad or failure about yourself  1 0 1 0 1  Trouble concentrating 1 0 1 1 1   Moving slowly or fidgety/restless 2 0 0 0 1  Suicidal thoughts 1 0 0 0 0  PHQ-9 Score 10 0 10 6 14   Difficult doing work/chores Somewhat difficult - Somewhat difficult Somewhat difficult Very difficult  Some recent data might be hidden    phq 9 is positive   Fall Risk: Fall Risk  01/02/2020 08/30/2019 05/30/2019 04/27/2019 03/30/2019  Falls in the past year? 0 0 1 1 1  Number  falls in past yr: 0 0 0 0 0  Injury with Fall? 0 0 0 0 0  Risk for fall due to : - - - - -  Follow up - - - - -    Functional Status Survey: Is the patient deaf or have difficulty hearing?: No Does the patient have difficulty seeing, even when wearing glasses/contacts?: No Does the patient have difficulty concentrating, remembering, or making decisions?: Yes Does the patient have difficulty walking or climbing stairs?: Yes Does the patient have difficulty dressing or bathing?: Yes Does the patient have difficulty doing errands alone such as visiting a doctor's office or shopping?: No    Assessment & Plan  1. Centrilobular emphysema (HCC)  - tiotropium (SPIRIVA HANDIHALER) 18 MCG inhalation capsule; Place 1 capsule (18 mcg total) into inhaler and inhale daily.  Dispense: 90 capsule; Refill: 1 - fluticasone furoate-vilanterol (BREO ELLIPTA) 100-25 MCG/INH AEPB; Inhale 1 puff into the lungs daily.  Dispense: 180 each; Refill: 1  2. Migraine without aura and with status migrainosus, not intractable  - pregabalin (LYRICA) 100 MG capsule; Take 1 capsule (100 mg total) by mouth 2 (two) times daily.  Dispense: 180 capsule; Refill: 1 - rizatriptan (MAXALT) 10 MG tablet; Take 1 tablet (10 mg total) by mouth as needed for migraine. May repeat in 2 hours if needed  Dispense: 10 tablet; Refill: 1  3. Outbursts of anger  - hydrOXYzine (ATARAX/VISTARIL) 10 MG tablet; Take 1 tablet (10 mg total) by mouth 3 (three) times daily as needed.  Dispense: 90 tablet; Refill: 1  4. Insomnia, unspecified type   5. Primary osteoarthritis of both knees   6. Dyslipidemia  - rosuvastatin (CRESTOR) 5 MG tablet; Take 1 tablet (5 mg total) by mouth daily.  Dispense: 90 tablet; Refill: 1  7. History of malignant melanoma of skin   8. Long-term use of high-risk medication   9. Seasonal affective disorder (HCC)  - ARIPiprazole (ABILIFY) 5 MG tablet; Take 1 tablet (5 mg total) by mouth daily.  Dispense:  90 tablet; Refill: 0  10. Moderate major depression (HCC)  - hydrOXYzine (ATARAX/VISTARIL) 10 MG tablet; Take 1 tablet (10 mg total) by mouth 3 (three) times daily as needed.  Dispense: 90 tablet; Refill: 1 - ARIPiprazole (ABILIFY) 5 MG tablet; Take 1 tablet (5 mg total) by mouth daily.  Dispense: 90 tablet; Refill: 0  11. Chronic neck and back pain  - DULoxetine (CYMBALTA) 60 MG capsule; Take 1 capsule (60 mg total) by mouth daily.  Dispense: 90 capsule; Refill: 1  12. COPD with acute exacerbation (HCC)  - amoxicillin-clavulanate (AUGMENTIN) 875-125 MG tablet; Take 1 tablet by mouth 2 (two) times daily.  Dispense: 20 tablet; Refill: 0 - predniSONE (DELTASONE) 10 MG tablet; Take 1 tablet (10 mg total) by mouth 2 (two) times daily with a meal.  Dispense: 10 tablet; Refill: 0  13. Atherosclerosis of aorta ([place])  We will try Crestor   14. History of drug dependence/abuse ([place])   15. Need for immunization against influenza  - Flu Vaccine QUAD 36+ mos IM  16. Need for vaccination against Streptococcus pneumoniae using pneumococcal conjugate vaccine 13  - Pneumococcal conjugate vaccine 13-valent IM

## 2020-01-06 DIAGNOSIS — D034 Melanoma in situ of scalp and neck: Secondary | ICD-10-CM | POA: Diagnosis not present

## 2020-01-14 DIAGNOSIS — J449 Chronic obstructive pulmonary disease, unspecified: Secondary | ICD-10-CM | POA: Diagnosis not present

## 2020-01-19 ENCOUNTER — Ambulatory Visit: Payer: Medicare Other

## 2020-02-02 ENCOUNTER — Ambulatory Visit (INDEPENDENT_AMBULATORY_CARE_PROVIDER_SITE_OTHER): Payer: Medicare Other

## 2020-02-02 DIAGNOSIS — Z0000 Encounter for general adult medical examination without abnormal findings: Secondary | ICD-10-CM | POA: Diagnosis not present

## 2020-02-02 NOTE — Patient Instructions (Signed)
[name] , Thank you for taking time to come for your Medicare Wellness Visit. I appreciate your ongoing commitment to your health goals. Please review the following plan we discussed and let me know if I can assist you in the future.   Screening recommendations/referrals: Colonoscopy: Cologuard done 12/06/18. Repeat in 2023 Recommended yearly ophthalmology/optometry visit for glaucoma screening and checkup Recommended yearly dental visit for hygiene and checkup  Vaccinations: Influenza vaccine: done 01/02/20 Pneumococcal vaccine: done 01/02/20 Tdap vaccine: done 11/26/18 Shingles vaccine: Shingrix discussed. Please contact your pharmacy for coverage information.  Covid-19:  Done 05/26/19 & 06/20/19  Advanced directives: Please bring a copy of your health care power of attorney and living will to the office at your convenience once you have completed that paperwork  Conditions/risks identified: Recommend drinking 6-8 glasses of water per day   Next appointment: Follow up in one year for your annual wellness visit   Preventive Care 40-64 Years, Male Preventive care refers to lifestyle choices and visits with your health care provider that can promote health and wellness. What does preventive care include?  A yearly physical exam. This is also called an annual well check.  Dental exams once or twice a year.  Routine eye exams. Ask your health care provider how often you should have your eyes checked.  Personal lifestyle choices, including:  Daily care of your teeth and gums.  Regular physical activity.  Eating a healthy diet.  Avoiding tobacco and drug use.  Limiting alcohol use.  Practicing safe sex.  Taking low-dose aspirin every day starting at age 44. What happens during an annual well check? The services and screenings done by your health care provider during your annual well check will depend on your age, overall health, lifestyle risk factors, and family history of  disease. Counseling  Your health care provider may ask you questions about your:  Alcohol use.  Tobacco use.  Drug use.  Emotional well-being.  Home and relationship well-being.  Sexual activity.  Eating habits.  Work and work Statistician. Screening  You may have the following tests or measurements:  Height, weight, and BMI.  Blood pressure.  Lipid and cholesterol levels. These may be checked every 5 years, or more frequently if you are over 78 years old.  Skin check.  Lung cancer screening. You may have this screening every year starting at age 66 if you have a 30-pack-year history of smoking and currently smoke or have quit within the past 15 years.  Fecal occult blood test (FOBT) of the stool. You may have this test every year starting at age 36.  Flexible sigmoidoscopy or colonoscopy. You may have a sigmoidoscopy every 5 years or a colonoscopy every 10 years starting at age 68.  Prostate cancer screening. Recommendations will vary depending on your family history and other risks.  Hepatitis C blood test.  Hepatitis B blood test.  Sexually transmitted disease (STD) testing.  Diabetes screening. This is done by checking your blood sugar (glucose) after you have not eaten for a while (fasting). You may have this done every 1-3 years. Discuss your test results, treatment options, and if necessary, the need for more tests with your health care provider. Vaccines  Your health care provider may recommend certain vaccines, such as:  Influenza vaccine. This is recommended every year.  Tetanus, diphtheria, and acellular pertussis (Tdap, Td) vaccine. You may need a Td booster every 10 years.  Zoster vaccine. You may need this after age 46.  Pneumococcal 13-valent  conjugate (PCV13) vaccine. You may need this if you have certain conditions and have not been vaccinated.  Pneumococcal polysaccharide (PPSV23) vaccine. You may need one or two doses if you smoke cigarettes  or if you have certain conditions. Talk to your health care provider about which screenings and vaccines you need and how often you need them. This information is not intended to replace advice given to you by your health care provider. Make sure you discuss any questions you have with your health care provider. Document Released: 03/16/2015 Document Revised: 11/07/2015 Document Reviewed: 12/19/2014 Elsevier Interactive Patient Education  2017 [place] Prevention in the Home Falls can cause injuries. They can happen to people of all ages. There are many things you can do to make your home safe and to help prevent falls. What can I do on the outside of my home?  Regularly fix the edges of walkways and driveways and fix any cracks.  Remove anything that might make you trip as you walk through a door, such as a raised step or threshold.  Trim any bushes or trees on the path to your home.  Use bright outdoor lighting.  Clear any walking paths of anything that might make someone trip, such as rocks or tools.  Regularly check to see if handrails are loose or broken. Make sure that both sides of any steps have handrails.  Any raised decks and porches should have guardrails on the edges.  Have any leaves, snow, or ice cleared regularly.  Use sand or salt on walking paths during winter.  Clean up any spills in your garage right away. This includes oil or grease spills. What can I do in the bathroom?  Use night lights.  Install grab bars by the toilet and in the tub and shower. Do not use towel bars as grab bars.  Use non-skid mats or decals in the tub or shower.  If you need to sit down in the shower, use a plastic, non-slip stool.  Keep the floor dry. Clean up any water that spills on the floor as soon as it happens.  Remove soap buildup in the tub or shower regularly.  Attach bath mats securely with double-sided non-slip rug tape.  Do not have throw rugs and other  things on the floor that can make you trip. What can I do in the bedroom?  Use night lights.  Make sure that you have a light by your bed that is easy to reach.  Do not use any sheets or blankets that are too big for your bed. They should not hang down onto the floor.  Have a firm chair that has side arms. You can use this for support while you get dressed.  Do not have throw rugs and other things on the floor that can make you trip. What can I do in the kitchen?  Clean up any spills right away.  Avoid walking on wet floors.  Keep items that you use a lot in easy-to-reach places.  If you need to reach something above you, use a strong step stool that has a grab bar.  Keep electrical cords out of the way.  Do not use floor polish or wax that makes floors slippery. If you must use wax, use non-skid floor wax.  Do not have throw rugs and other things on the floor that can make you trip. What can I do with my stairs?  Do not leave any items on the stairs.  Make sure that there are handrails on both sides of the stairs and use them. Fix handrails that are broken or loose. Make sure that handrails are as long as the stairways.  Check any carpeting to make sure that it is firmly attached to the stairs. Fix any carpet that is loose or worn.  Avoid having throw rugs at the top or bottom of the stairs. If you do have throw rugs, attach them to the floor with carpet tape.  Make sure that you have a light switch at the top of the stairs and the bottom of the stairs. If you do not have them, ask someone to add them for you. What else can I do to help prevent falls?  Wear shoes that:  Do not have high heels.  Have rubber bottoms.  Are comfortable and fit you well.  Are closed at the toe. Do not wear sandals.  If you use a stepladder:  Make sure that it is fully opened. Do not climb a closed stepladder.  Make sure that both sides of the stepladder are locked into place.  Ask  someone to hold it for you, if possible.  Clearly mark and make sure that you can see:  Any grab bars or handrails.  First and last steps.  Where the edge of each step is.  Use tools that help you move around (mobility aids) if they are needed. These include:  Canes.  Walkers.  Scooters.  Crutches.  Turn on the lights when you go into a dark area. Replace any light bulbs as soon as they burn out.  Set up your furniture so you have a clear path. Avoid moving your furniture around.  If any of your floors are uneven, fix them.  If there are any pets around you, be aware of where they are.  Review your medicines with your doctor. Some medicines can make you feel dizzy. This can increase your chance of falling. Ask your doctor what other things that you can do to help prevent falls. This information is not intended to replace advice given to you by your health care provider. Make sure you discuss any questions you have with your health care provider. Document Released: 12/14/2008 Document Revised: 07/26/2015 Document Reviewed: 03/24/2014 Elsevier Interactive Patient Education  2017 [employer].

## 2020-02-02 NOTE — Progress Notes (Signed)
Subjective:   Ronald Ellis is a 59 y.o. male who presents for Medicare Annual/Subsequent preventive examination.  Virtual Visit via Telephone Note  I connected with  [name] on 02/02/20 at  2:50 PM EST by telephone and verified that I am speaking with the correct person using two identifiers.  Location: Patient: home Provider: [place] Persons participating in the virtual visit: [name]   I discussed the limitations, risks, security and privacy concerns of performing an evaluation and management service by telephone and the availability of in person appointments. The patient expressed understanding and agreed to proceed.  Interactive audio and video telecommunications were attempted between this nurse and patient, however failed, due to patient having technical difficulties OR patient did not have access to video capability.  We continued and completed visit with audio only.  Some vital signs may be absent or patient reported.   [name], LPN    Review of Systems     Cardiac Risk Factors include: advanced age (>38mn, >>31women);male gender     Objective:    Today's Vitals   02/02/20 1439  PainSc: 7    There is no height or weight on file to calculate BMI.  Advanced Directives 02/02/2020 01/14/2019 10/01/2016 02/15/2016 01/16/2016 12/05/2015 10/15/2015  Does Patient Have a Medical Advance Directive? No No No No No No No  Would patient like information on creating a medical advance directive? No - Patient declined Yes (MAU/Ambulatory/Procedural Areas - Information given) - - No - patient declined information No - patient declined information No - patient declined information    Current Medications (verified) Outpatient Encounter Medications as of 02/02/2020  Medication Sig  . albuterol (VENTOLIN HFA) 108 (90 Base) MCG/ACT inhaler Inhale 2 puffs into the lungs every 6 (six) hours as needed for wheezing or shortness of breath.  . ARIPiprazole (ABILIFY) 5  MG tablet Take 1 tablet (5 mg total) by mouth daily.  . Ascorbic Acid (VITAMIN C PO) Take by mouth.  .Marland Kitchenb complex vitamins tablet Take 1 tablet by mouth daily.  . benzonatate (TESSALON) 200 MG capsule Take 1 capsule (200 mg total) by mouth 3 (three) times daily as needed.  .Marland KitchenBIOTIN PO Take by mouth.  . DULoxetine (CYMBALTA) 60 MG capsule Take 1 capsule (60 mg total) by mouth daily.  . fluticasone (FLONASE) 50 MCG/ACT nasal spray Place 2 sprays into both nostrils daily.  . fluticasone furoate-vilanterol (BREO ELLIPTA) 100-25 MCG/INH AEPB Inhale 1 puff into the lungs daily.  . hydrOXYzine (ATARAX/VISTARIL) 10 MG tablet Take 1 tablet (10 mg total) by mouth 3 (three) times daily as needed.  .Marland KitchenMAGNESIUM PO Take by mouth.  . pregabalin (LYRICA) 100 MG capsule Take 1 capsule (100 mg total) by mouth 2 (two) times daily.  . rizatriptan (MAXALT) 10 MG tablet Take 1 tablet (10 mg total) by mouth as needed for migraine. May repeat in 2 hours if needed  . rosuvastatin (CRESTOR) 5 MG tablet Take 1 tablet (5 mg total) by mouth daily.  .Marland Kitchentiotropium (SPIRIVA HANDIHALER) 18 MCG inhalation capsule Place 1 capsule (18 mcg total) into inhaler and inhale daily.  .Marland KitchentiZANidine (ZANAFLEX) 4 MG tablet Take 1 tablet (4 mg total) by mouth at bedtime.  . traZODone (DESYREL) 100 MG tablet Take 1 tablet (100 mg total) by mouth at bedtime. Pt taking 2 tablets qhs  . zinc gluconate 50 MG tablet Take 50 mg by mouth daily.  . [DISCONTINUED] amoxicillin-clavulanate (AUGMENTIN) 875-125 MG tablet Take 1 tablet  by mouth 2 (two) times daily.  . [DISCONTINUED] predniSONE (DELTASONE) 10 MG tablet Take 1 tablet (10 mg total) by mouth 2 (two) times daily with a meal.   No facility-administered encounter medications on file as of 02/02/2020.    Allergies (verified) Gluten meal   History: Past Medical History:  Diagnosis Date  . Arthritis   . Chronic pain   . COPD (chronic obstructive pulmonary disease) ([place])   . Depression  08/06/81  . Gluten intolerance   . Oxygen deficiency   . Pneumonia    Past Surgical History:  Procedure Laterality Date  . BACK SURGERY  2015  . BACK SURGERY    . INGUINAL HERNIA REPAIR Bilateral    multiple times  . lumbar discetomy  2005  . MOLE REMOVAL    . NECK SURGERY  2016  . SPINE SURGERY  12/15/01   Family History  Problem Relation Age of Onset  . Hypertension Father   . Lung disease Father   . Hypertension Brother   . Lung cancer Maternal Grandfather    Social History   Socioeconomic History  . Marital status: Significant Other    Spouse name: [name]  . Number of children: 4  . Years of education: Not on file  . Highest education level: Some college, no degree  Occupational History  . 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 Smoking status: Former Smoker    Packs/day: 1.00    Years: 42.00    Pack years: 42.00    Types: Cigarettes    Start date: 12    Quit date: 12/13/2011    Years since quitting: 8.1  . Smokeless tobacco: Never Used  Vaping Use  . Vaping Use: Never used  Substance and Sexual Activity  . Alcohol use: Not Currently    Comment: quit 5-6 years ago, only beer - but glutten free diet now  . Drug use: Not Currently    Types: "Crack" cocaine, Cocaine, Heroin, Marijuana, Other-see comments    Comment: meths in his 20's-30's  . Sexual activity: Yes    Partners: Female  Other Topics Concern  . Not on file  Social History Narrative   Approved for disability 06/2017 ( workman's comp back 2014) , secondary to neck and back injury    Social Determinants of Health   Financial Resource Strain: Low Risk   . Difficulty of Paying Living Expenses: Not very hard  Food Insecurity: No Food Insecurity  . Worried About Charity fundraiser in the Last Year: Never true  . Ran Out of Food in the Last Year: Never true  Transportation Needs: No Transportation Needs  . Lack of Transportation  (Medical): No  . Lack of Transportation (Non-Medical): No  Physical Activity: Inactive  . Days of Exercise per Week: 0 days  . Minutes of Exercise per Session: 0 min  Stress: No Stress Concern Present  . Feeling of Stress : Only a little  Social Connections: Moderately Isolated  . Frequency of Communication with Friends and Family: More than three times a week  . Frequency of Social Gatherings with Friends and Family: Twice a week  . Attends Religious Services: Never  . Active Member of Clubs or Organizations: No  . Attends Archivist Meetings: Never  . Marital Status: Living with partner    Tobacco Counseling Counseling given: Not Answered   Clinical Intake:  Pre-visit preparation completed: Yes  Pain :  0-10 Pain Score: 7  Pain Type: Chronic pain Pain Location: Back Pain Orientation: Lower Pain Descriptors / Indicators: Aching, Discomfort Pain Onset: More than a month ago Pain Frequency: Constant     Nutritional Risks: None Diabetes: No  How often do you need to have someone help you when you read instructions, pamphlets, or other written materials from your doctor or pharmacy?: 1 - Never    Interpreter Needed?: No  Information entered by :: [name] LPN   Activities of Daily Living In your present state of health, do you have any difficulty performing the following activities: 02/02/2020 01/02/2020  Hearing? N N  Comment declines hearing aids -  Vision? N N  Difficulty concentrating or making decisions? Tempie Donning  Walking or climbing stairs? Y Y  Dressing or bathing? N Y  Doing errands, shopping? N N  Preparing Food and eating ? N -  Using the Toilet? N -  In the past six months, have you accidently leaked urine? N -  Do you have problems with loss of bowel control? N -  Managing your Medications? N -  Managing your Finances? N -  Housekeeping or managing your Housekeeping? N -  Some recent data might be hidden    Patient Care Team: Steele Sizer, MD as PCP - General (Family Medicine) End, Harrell Gave, MD as PCP - Cardiology (Cardiology) Tamsen Meek, MD as Referring Physician (Dermatology) Flora Lipps, MD as Consulting Physician (Pulmonary Disease)  Indicate any recent Medical Services you may have received from other than [facility] providers in the past year (date may be approximate).     Assessment:   This is a routine wellness examination for [place].  Hearing/Vision screen  Hearing Screening   125Hz  250Hz  500Hz  1000Hz  2000Hz  3000Hz  4000Hz  6000Hz  8000Hz   Right ear:           Left ear:           Comments:  Pt denies hearing difficulty  Vision Screening Comments: Past due for eye exam. Not established with provider. Pt had lasix surgery in 2005.   Dietary issues and exercise activities discussed: Current Exercise Habits: The patient does not participate in regular exercise at present, Exercise limited by: orthopedic condition(s)  Goals    . Weight (lb) < 190 lb (86.2 kg)     Pt would like to lose 10 lbs over the next year with diet and exercise      Depression Screen PHQ 2/9 Scores 02/02/2020 01/02/2020 08/30/2019 05/30/2019 04/27/2019 03/30/2019 01/14/2019  PHQ - 2 Score 0 2 0 2 2 4 2   PHQ- 9 Score - 10 0 10 6 14 5     Fall Risk Fall Risk  02/02/2020 01/02/2020 08/30/2019 05/30/2019 04/27/2019  Falls in the past year? 0 0 0 1 1  Number falls in past yr: 0 0 0 0 0  Injury with Fall? 0 0 0 0 0  Risk for fall due to : No Fall Risks - - - -  Follow up Falls prevention discussed - - - -    Any stairs in or around the home? Yes  If so, are there any without handrails? No  Home free of loose throw rugs in walkways, pet beds, electrical cords, etc? Yes  Adequate lighting in your home to reduce risk of falls? Yes   ASSISTIVE DEVICES UTILIZED TO PREVENT FALLS:  Life alert? No  Use of a cane, walker or w/c? No  Grab bars in the bathroom? No  Shower  chair or bench in shower? No  Elevated toilet seat or a  handicapped toilet? No   TIMED UP AND GO:  Was the test performed? No . Telephonic visit.   Cognitive Function:        Immunizations Immunization History  Administered Date(s) Administered  . Influenza,inj,Quad PF,6+ Mos 04/27/2019, 01/02/2020  . PFIZER SARS-COV-2 Vaccination 05/26/2019, 06/20/2019  . Pneumococcal Conjugate-13 01/02/2020  . Pneumococcal Polysaccharide-23 06/29/2017  . Tdap 11/26/2018    TDAP status: Up to date   Flu Vaccine status: Up to date   Pneumococcal vaccine status: Up to date   Covid-19 vaccine status: Completed vaccines  Qualifies for Shingles Vaccine? Yes   Zostavax completed No   Shingrix Completed?: No.    Education has been provided regarding the importance of this vaccine. Patient has been advised to call insurance company to determine out of pocket expense if they have not yet received this vaccine. Advised may also receive vaccine at local pharmacy or Health Dept. Verbalized acceptance and understanding.  Screening Tests Health Maintenance  Topic Date Due  . Fecal DNA (Cologuard)  12/05/2021  . TETANUS/TDAP  11/25/2028  . INFLUENZA VACCINE  Completed  . COVID-19 Vaccine  Completed  . Hepatitis C Screening  Completed  . HIV Screening  Completed    Health Maintenance  There are no preventive care reminders to display for this patient.  Cologuard completed 12/06/18. Repeat every 3 years.   Lung Cancer Screening: (Low Dose CT Chest recommended if Age 56-80 years, 30 pack-year currently smoking OR have quit w/in 15years.) does qualify. Completed 12/16/19  Additional Screening:  Hepatitis C Screening: does qualify; Completed 06/29/17  Vision Screening: Recommended annual ophthalmology exams for early detection of glaucoma and other disorders of the eye. Is the patient up to date with their annual eye exam?  No  Who is the provider or what is the name of the office in which the patient attends annual eye exams? Not established If pt  is not established with a provider, would they like to be referred to a provider to establish care? No .   Dental Screening: Recommended annual dental exams for proper oral hygiene  Community Resource Referral / Chronic Care Management: CRR required this visit?  No   CCM required this visit?  No      Plan:     I have personally reviewed and noted the following in the patient's chart:   . Medical and social history . Use of alcohol, tobacco or illicit drugs  . Current medications and supplements . Functional ability and status . Nutritional status . Physical activity . Advanced directives . List of other physicians . Hospitalizations, surgeries, and ER visits in previous 12 months . Vitals . Screenings to include cognitive, depression, and falls . Referrals and appointments  In addition, I have reviewed and discussed with patient certain preventive protocols, quality metrics, and best practice recommendations. A written personalized care plan for preventive services as well as general preventive health recommendations were provided to patient.     [name], LPN   83/08/2900   Nurse Notes: none

## 2020-02-13 DIAGNOSIS — J449 Chronic obstructive pulmonary disease, unspecified: Secondary | ICD-10-CM | POA: Diagnosis not present

## 2020-02-14 NOTE — Progress Notes (Signed)
Name: Ronald Ellis   MRN: [id number]    DOB: August 04, 1960   Date:02/14/2020       Progress Note  Subjective  Chief Complaint  Back Pain/Lung Congestion  I connected with  Ronald Ellis  on 02/14/20 at 11:40 AM EST by a telephone encounter  application and verified that I am speaking with the correct person using two identifiers.  I discussed the limitations of evaluation and management by telemedicine and the availability of in person appointments. The patient expressed understanding and agreed to proceed with the virtual visit  Staff also discussed with the patient that there may be a patient responsible charge related to this service. Patient Location: at home  Provider Location: [facility]  Additional Individuals present: grandson and his wife, but not on conversion    HPI  COPD flare: he was treated with Augmentin and prednisone about 6 weeks ago. Two weeks ago he worked in his yard trimming bushes, blowing leaves,  racking leaves and that same day he developed SOB, coughing and wheezing. He has been using his rescue inhaler more often a few times a day to help with symptoms . He takes Spiriva and Breo daily. He is also taking otc Mucinex DM, Tylenol DM without much help. No fever, chills , no change in taste of smell, normal appetite. No sick contacts. COVID-19 test negative yesterday.   Chronic neck and lumbar spine: he states his pain is getting progressively worse, daily and constant pain, sometimes going down his right leg and he would like to go back to ortho    Patient Active Problem List   Diagnosis Date Noted  . History of malignant melanoma of skin 08/03/2019  . Chest pain of uncertain etiology 40/81/4481  . Shortness of breath 06/24/2019  . Atherosclerosis of aorta ([place]) 12/17/2018  . Personal history of tobacco use, presenting hazards to health 12/16/2018  . History of drug dependence/abuse ([place]) 11/26/2018  . Celiac disease 02/05/2018  . COPD (chronic obstructive pulmonary  disease) ([place]) 06/29/2017  . Right-sided low back pain with right-sided sciatica 12/05/2015  . Plantar fasciitis of right foot 10/15/2015  . Chronic neck and back pain 07/19/2015  . Fatigue 07/19/2015    Past Surgical History:  Procedure Laterality Date  . BACK SURGERY  2015  . BACK SURGERY    . INGUINAL HERNIA REPAIR Bilateral    multiple times  . lumbar discetomy  2005  . MOLE REMOVAL    . NECK SURGERY  2016  . SPINE SURGERY  12/15/01    Family History  Problem Relation Age of Onset  . Hypertension Father   . Lung disease Father   . Hypertension Brother   . Lung cancer Maternal Grandfather     Social History   Socioeconomic History  . Marital status: Significant Other    Spouse name: Ronald Ellis  . Number of children: 4  . Years of education: Not on file  . Highest education level: Some college, no degree  Occupational History  . 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 Smoking status: Former Smoker    Packs/day: 1.00    Years: 42.00    Pack years: 42.00    Types: Cigarettes    Start date: 40    Quit date: 12/13/2011    Years since quitting: 8.1  . Smokeless tobacco: Never Used  Vaping Use  . Vaping Use: Never used  Substance and Sexual Activity  .  Alcohol use: Not Currently    Comment: quit 5-6 years ago, only beer - but glutten free diet now  . Drug use: Not Currently    Types: "Crack" cocaine, Cocaine, Heroin, Marijuana, Other-see comments    Comment: meths in his 20's-30's  . Sexual activity: Yes    Partners: Female  Other Topics Concern  . Not on file  Social History Narrative   Approved for disability 06/2017 ( workman's comp back 2014) , secondary to neck and back injury    Social Determinants of Health   Financial Resource Strain: Low Risk   . Difficulty of Paying Living Expenses: Not very hard  Food Insecurity: No Food Insecurity  . Worried About Charity fundraiser in the Last Year:  Never true  . Ran Out of Food in the Last Year: Never true  Transportation Needs: No Transportation Needs  . Lack of Transportation (Medical): No  . Lack of Transportation (Non-Medical): No  Physical Activity: Inactive  . Days of Exercise per Week: 0 days  . Minutes of Exercise per Session: 0 min  Stress: No Stress Concern Present  . Feeling of Stress : Only a little  Social Connections: Moderately Isolated  . Frequency of Communication with Friends and Family: More than three times a week  . Frequency of Social Gatherings with Friends and Family: Twice a week  . Attends Religious Services: Never  . Active Member of Clubs or Organizations: No  . Attends Archivist Meetings: Never  . Marital Status: Living with partner  Intimate Partner Violence: Not At Risk  . Fear of Current or Ex-Partner: No  . Emotionally Abused: No  . Physically Abused: No  . Sexually Abused: No     Current Outpatient Medications:  .  albuterol (VENTOLIN HFA) 108 (90 Base) MCG/ACT inhaler, Inhale 2 puffs into the lungs every 6 (six) hours as needed for wheezing or shortness of breath., Disp: 18 g, Rfl: 0 .  ARIPiprazole (ABILIFY) 5 MG tablet, Take 1 tablet (5 mg total) by mouth daily., Disp: 90 tablet, Rfl: 0 .  Ascorbic Acid (VITAMIN C PO), Take by mouth., Disp: , Rfl:  .  b complex vitamins tablet, Take 1 tablet by mouth daily., Disp: , Rfl:  .  benzonatate (TESSALON) 200 MG capsule, Take 1 capsule (200 mg total) by mouth 3 (three) times daily as needed., Disp: 100 capsule, Rfl: 0 .  BIOTIN PO, Take by mouth., Disp: , Rfl:  .  DULoxetine (CYMBALTA) 60 MG capsule, Take 1 capsule (60 mg total) by mouth daily., Disp: 90 capsule, Rfl: 1 .  fluticasone (FLONASE) 50 MCG/ACT nasal spray, Place 2 sprays into both nostrils daily., Disp: 16 g, Rfl: 6 .  fluticasone furoate-vilanterol (BREO ELLIPTA) 100-25 MCG/INH AEPB, Inhale 1 puff into the lungs daily., Disp: 180 each, Rfl: 1 .  hydrOXYzine  (ATARAX/VISTARIL) 10 MG tablet, Take 1 tablet (10 mg total) by mouth 3 (three) times daily as needed., Disp: 90 tablet, Rfl: 1 .  MAGNESIUM PO, Take by mouth., Disp: , Rfl:  .  pregabalin (LYRICA) 100 MG capsule, Take 1 capsule (100 mg total) by mouth 2 (two) times daily., Disp: 180 capsule, Rfl: 1 .  rizatriptan (MAXALT) 10 MG tablet, Take 1 tablet (10 mg total) by mouth as needed for migraine. May repeat in 2 hours if needed, Disp: 10 tablet, Rfl: 1 .  rosuvastatin (CRESTOR) 5 MG tablet, Take 1 tablet (5 mg total) by mouth daily., Disp: 90 tablet, Rfl: 1 .  tiotropium (SPIRIVA HANDIHALER) 18 MCG inhalation capsule, Place 1 capsule (18 mcg total) into inhaler and inhale daily., Disp: 90 capsule, Rfl: 1 .  tiZANidine (ZANAFLEX) 4 MG tablet, Take 1 tablet (4 mg total) by mouth at bedtime., Disp: 90 tablet, Rfl: 1 .  traZODone (DESYREL) 100 MG tablet, Take 1 tablet (100 mg total) by mouth at bedtime. Pt taking 2 tablets qhs, Disp: 90 tablet, Rfl: 0 .  zinc gluconate 50 MG tablet, Take 50 mg by mouth daily., Disp: , Rfl:   Allergies  Allergen Reactions  . Gluten Meal     I personally reviewed active problem list, medication list, allergies, family history, social history, health maintenance, notes from last encounter with the patient/caregiver today.   ROS  Ten systems reviewed and is negative except as mentioned in HPI   Objective  Virtual encounter, vitals not obtained.  There is no height or weight on file to calculate BMI.  Physical Exam  Awake, alert and oriented  PHQ2/9: Depression screen [facility] 2/9 02/02/2020 01/02/2020 08/30/2019 05/30/2019 04/27/2019  Decreased Interest 0 1 0 1 1  Down, Depressed, Hopeless 0 1 0 1 1  PHQ - 2 Score 0 2 0 2 2  Altered sleeping - 1 0 2 1  Tired, decreased energy - 1 0 2 1  Change in appetite - 1 0 2 1  Feeling bad or failure about yourself  - 1 0 1 0  Trouble concentrating - 1 0 1 1  Moving slowly or fidgety/restless - 2 0 0 0  Suicidal thoughts  - 1 0 0 0  PHQ-9 Score - 10 0 10 6  Difficult doing work/chores - Somewhat difficult - Somewhat difficult Somewhat difficult  Some recent data might be hidden   PHQ-2/9 Result is negative.    Fall Risk: Fall Risk  02/02/2020 01/02/2020 08/30/2019 05/30/2019 04/27/2019  Falls in the past year? 0 0 0 1 1  Number falls in past yr: 0 0 0 0 0  Injury with Fall? 0 0 0 0 0  Risk for fall due to : No Fall Risks - - - -  Follow up Falls prevention discussed - - - -    Assessment & Plan  1. COPD with acute exacerbation (HCC)  - predniSONE (DELTASONE) 10 MG tablet; Take 1 tablet (10 mg total) by mouth 2 (two) times daily with a meal.  Dispense: 10 tablet; Refill: 0  We will start with prednisone, he will contact me back if no improvement and we will send doxy, recently took augmentin   2. Chronic neck and back pain  - Ambulatory referral to Orthopedic Surgery I discussed the assessment and treatment plan with the patient. The patient was provided an opportunity to ask questions and all were answered. The patient agreed with the plan and demonstrated an understanding of the instructions.  The patient was advised to call back or seek an in-person evaluation if the symptoms worsen or if the condition fails to improve as anticipated.  I provided 15 minutes of non-face-to-face time during this encounter.

## 2020-02-15 ENCOUNTER — Ambulatory Visit (INDEPENDENT_AMBULATORY_CARE_PROVIDER_SITE_OTHER): Payer: Medicare Other | Admitting: Family Medicine

## 2020-02-15 ENCOUNTER — Other Ambulatory Visit: Payer: Self-pay

## 2020-02-15 ENCOUNTER — Encounter: Payer: Self-pay | Admitting: Family Medicine

## 2020-02-15 VITALS — Ht 72.0 in | Wt 200.0 lb

## 2020-02-15 DIAGNOSIS — G8929 Other chronic pain: Secondary | ICD-10-CM

## 2020-02-15 DIAGNOSIS — M542 Cervicalgia: Secondary | ICD-10-CM | POA: Diagnosis not present

## 2020-02-15 DIAGNOSIS — M549 Dorsalgia, unspecified: Secondary | ICD-10-CM | POA: Diagnosis not present

## 2020-02-15 DIAGNOSIS — J441 Chronic obstructive pulmonary disease with (acute) exacerbation: Secondary | ICD-10-CM

## 2020-02-15 MED ORDER — PREDNISONE 10 MG PO TABS: 10.0000 mg | ORAL_TABLET | Freq: Two times a day (BID) | ORAL | 0 refills | Status: DC

## 2020-02-16 ENCOUNTER — Ambulatory Visit: Payer: Medicare Other

## 2020-02-17 DIAGNOSIS — M7062 Trochanteric bursitis, left hip: Secondary | ICD-10-CM | POA: Diagnosis not present

## 2020-02-17 DIAGNOSIS — M7061 Trochanteric bursitis, right hip: Secondary | ICD-10-CM | POA: Diagnosis not present

## 2020-02-17 DIAGNOSIS — M545 Low back pain, unspecified: Secondary | ICD-10-CM | POA: Diagnosis not present

## 2020-03-15 DIAGNOSIS — J449 Chronic obstructive pulmonary disease, unspecified: Secondary | ICD-10-CM | POA: Diagnosis not present

## 2020-03-30 DIAGNOSIS — M545 Low back pain, unspecified: Secondary | ICD-10-CM | POA: Diagnosis not present

## 2020-04-11 DIAGNOSIS — M545 Low back pain, unspecified: Secondary | ICD-10-CM | POA: Diagnosis not present

## 2020-04-15 DIAGNOSIS — J449 Chronic obstructive pulmonary disease, unspecified: Secondary | ICD-10-CM | POA: Diagnosis not present

## 2020-04-23 NOTE — Progress Notes (Unsigned)
Name: Ronald Ellis   MRN: [id number]    DOB: 04/21/1960   Date:04/24/2020       Progress Note  Subjective  Chief Complaint  COPD Flare  HPI  COPD flare: last flare was in Dec, over the past 5 days he has not been feeling well. Increase in cough, usually wet and occasionally able to expectorate clear sputum. Cough worse first in am and at the end of the day, feeling super tired, nasal congestion, rhinorrhea. SOB worse with activity. Using all medications as prescribed. COVID-19 negative but it was shortly after symptom onset and we will recheck it today   He has orthopnea, mild lower extremity edema. He denies chest pain before symptoms onset, but has chest tightness since sick.   Patient Active Problem List   Diagnosis Date Noted  . History of malignant melanoma of skin 08/03/2019  . Chest pain of uncertain etiology 61/60/7371  . Shortness of breath 06/24/2019  . Atherosclerosis of aorta ([place]) 12/17/2018  . Personal history of tobacco use, presenting hazards to health 12/16/2018  . History of drug dependence/abuse ([place]) 11/26/2018  . Celiac disease 02/05/2018  . COPD (chronic obstructive pulmonary disease) ([place]) 06/29/2017  . Right-sided low back pain with right-sided sciatica 12/05/2015  . Plantar fasciitis of right foot 10/15/2015  . Chronic neck and back pain 07/19/2015  . Fatigue 07/19/2015    Past Surgical History:  Procedure Laterality Date  . BACK SURGERY  2015  . BACK SURGERY    . INGUINAL HERNIA REPAIR Bilateral    multiple times  . lumbar discetomy  2005  . MOLE REMOVAL    . NECK SURGERY  2016  . SPINE SURGERY  12/15/01    Family History  Problem Relation Age of Onset  . Hypertension Father   . Lung disease Father   . Hypertension Brother   . Lung cancer Maternal Grandfather     Social History   Tobacco Use  . Smoking status: Former Smoker    Packs/day: 1.00    Years: 42.00    Pack years: 42.00    Types: Cigarettes    Start date: 57    Quit date:  12/13/2011    Years since quitting: 8.3  . Smokeless tobacco: Never Used  Substance Use Topics  . Alcohol use: Not Currently    Comment: quit 5-6 years ago, only beer - but glutten free diet now     Current Outpatient Medications:  .  albuterol (VENTOLIN HFA) 108 (90 Base) MCG/ACT inhaler, Inhale 2 puffs into the lungs every 6 (six) hours as needed for wheezing or shortness of breath., Disp: 18 g, Rfl: 0 .  ARIPiprazole (ABILIFY) 5 MG tablet, Take 1 tablet (5 mg total) by mouth daily., Disp: 90 tablet, Rfl: 0 .  Ascorbic Acid (VITAMIN C PO), Take by mouth., Disp: , Rfl:  .  atorvastatin (LIPITOR) 10 MG tablet, atorvastatin 10 mg tablet, Disp: , Rfl:  .  b complex vitamins tablet, Take 1 tablet by mouth daily., Disp: , Rfl:  .  benzonatate (TESSALON) 200 MG capsule, Take 1 capsule (200 mg total) by mouth 3 (three) times daily as needed., Disp: 100 capsule, Rfl: 0 .  BIOTIN PO, Take by mouth., Disp: , Rfl:  .  DULoxetine (CYMBALTA) 60 MG capsule, Take 1 capsule (60 mg total) by mouth daily., Disp: 90 capsule, Rfl: 1 .  fluticasone (FLONASE) 50 MCG/ACT nasal spray, Place 2 sprays into both nostrils daily., Disp: 16 g, Rfl: 6 .  fluticasone  furoate-vilanterol (BREO ELLIPTA) 100-25 MCG/INH AEPB, Inhale 1 puff into the lungs daily., Disp: 180 each, Rfl: 1 .  guaiFENesin-codeine 100-10 MG/5ML syrup, Take 10 mLs by mouth 3 (three) times daily as needed for cough., Disp: 120 mL, Rfl: 0 .  HYDROcodone-acetaminophen (NORCO) 10-325 MG tablet, hydrocodone 10 mg-acetaminophen 325 mg tablet  TAKE 1 TABLET BY MOUTH EVERY 8 HOURS FOR UP TO 5 DAYS AS NEEDED, Disp: , Rfl:  .  hydrOXYzine (ATARAX/VISTARIL) 10 MG tablet, Take 1 tablet (10 mg total) by mouth 3 (three) times daily as needed., Disp: 90 tablet, Rfl: 1 .  MAGNESIUM PO, Take by mouth., Disp: , Rfl:  .  predniSONE (DELTASONE) 10 MG tablet, Take 1-2 tablets (10-20 mg total) by mouth 3 (three) times daily before meals. 2 am 1 at lunch and 1 around 4 pm  with food, Disp: 20 tablet, Rfl: 0 .  pregabalin (LYRICA) 100 MG capsule, Take 1 capsule (100 mg total) by mouth 2 (two) times daily., Disp: 180 capsule, Rfl: 1 .  rizatriptan (MAXALT) 10 MG tablet, Take 1 tablet (10 mg total) by mouth as needed for migraine. May repeat in 2 hours if needed, Disp: 10 tablet, Rfl: 1 .  rosuvastatin (CRESTOR) 5 MG tablet, Take 1 tablet (5 mg total) by mouth daily., Disp: 90 tablet, Rfl: 1 .  tiotropium (SPIRIVA HANDIHALER) 18 MCG inhalation capsule, Place 1 capsule (18 mcg total) into inhaler and inhale daily., Disp: 90 capsule, Rfl: 1 .  tiZANidine (ZANAFLEX) 4 MG tablet, Take 1 tablet (4 mg total) by mouth at bedtime., Disp: 90 tablet, Rfl: 1 .  traZODone (DESYREL) 100 MG tablet, Take 1 tablet (100 mg total) by mouth at bedtime. Pt taking 2 tablets qhs, Disp: 90 tablet, Rfl: 0 .  zinc gluconate 50 MG tablet, Take 50 mg by mouth daily., Disp: , Rfl:   Allergies  Allergen Reactions  . Gluten Meal     I personally reviewed active problem list, medication list, allergies, family history, social history, health maintenance with the patient/caregiver today.   ROS  Ten systems reviewed and is negative except as mentioned in HPI   Objective  Vitals:   04/24/20 1509  BP: 136/78  Pulse: 95  Resp: 18  Temp: 98 F (36.7 C)  TempSrc: Oral  SpO2: 99%  Weight: 219 lb 4.8 oz (99.5 kg)  Height: 6' (1.829 m)    Body mass index is 29.74 kg/m.  Physical Exam  Constitutional: Patient appears well-developed and well-nourished. Overweight.  No distress.  HEENT: head atraumatic, normocephalic, pupils equal and reactive to light, neck supple Cardiovascular: Normal rate, regular rhythm and normal heart sounds.  No murmur heard. No BLE edema. Pulmonary/Chest: Effort normal and breath sounds normal. No respiratory distress. Abdominal: Soft.  There is no tenderness. Psychiatric: Patient has a normal mood and affect. behavior is normal. Judgment and thought content  normal.  PHQ2/9: Depression screen [facility] 2/9 04/24/2020 02/15/2020 02/02/2020 01/02/2020 08/30/2019  Decreased Interest 2 1 0 1 0  Down, Depressed, Hopeless 1 1 0 1 0  PHQ - 2 Score 3 2 0 2 0  Altered sleeping 2 3 - 1 0  Tired, decreased energy 2 0 - 1 0  Change in appetite 2 0 - 1 0  Feeling bad or failure about yourself  0 0 - 1 0  Trouble concentrating 0 0 - 1 0  Moving slowly or fidgety/restless 0 0 - 2 0  Suicidal thoughts 0 0 - 1 0  PHQ-9 Score  9 5 - 10 0  Difficult doing work/chores Somewhat difficult Not difficult at all - Somewhat difficult -  Some recent data might be hidden    phq 9 is positive  Fall Risk: Fall Risk  04/24/2020 02/15/2020 02/02/2020 01/02/2020 08/30/2019  Falls in the past year? 0 0 0 0 0  Number falls in past yr: 0 0 0 0 0  Injury with Fall? 0 0 0 0 0  Risk for fall due to : - - No Fall Risks - -  Follow up - - Falls prevention discussed - -     Functional Status Survey: Is the patient deaf or have difficulty hearing?: No Does the patient have difficulty seeing, even when wearing glasses/contacts?: Yes Does the patient have difficulty concentrating, remembering, or making decisions?: No Does the patient have difficulty walking or climbing stairs?: Yes Does the patient have difficulty dressing or bathing?: No Does the patient have difficulty doing errands alone such as visiting a doctor's office or shopping?: No   Assessment & Plan  1. COPD with acute exacerbation (HCC)  - predniSONE (DELTASONE) 10 MG tablet; Take 1-2 tablets (10-20 mg total) by mouth 3 (three) times daily before meals. 2 am 1 at lunch and 1 around 4 pm with food  Dispense: 20 tablet; Refill: 0 - guaiFENesin-codeine 100-10 MG/5ML syrup; Take 10 mLs by mouth 3 (three) times daily as needed for cough.  Dispense: 120 mL; Refill: 0  2. Respiratory crackles at both lung bases  - Brain natriuretic peptide - CBC with Differential/Platelet - Comprehensive metabolic panel - DG Chest 2 View;  Future  3. Lower extremity edema  - Brain natriuretic peptide - CBC with Differential/Platelet - Comprehensive metabolic panel - DG Chest 2 View; Future  4. Cough  - Novel Coronavirus, NAA ([facility])

## 2020-04-24 ENCOUNTER — Encounter: Payer: Self-pay | Admitting: Family Medicine

## 2020-04-24 ENCOUNTER — Other Ambulatory Visit: Payer: Self-pay

## 2020-04-24 ENCOUNTER — Ambulatory Visit (INDEPENDENT_AMBULATORY_CARE_PROVIDER_SITE_OTHER): Payer: Medicare Other | Admitting: Family Medicine

## 2020-04-24 VITALS — BP 136/78 | HR 95 | Temp 98.0°F | Resp 18 | Ht 72.0 in | Wt 219.3 lb

## 2020-04-24 DIAGNOSIS — R059 Cough, unspecified: Secondary | ICD-10-CM

## 2020-04-24 DIAGNOSIS — R0989 Other specified symptoms and signs involving the circulatory and respiratory systems: Secondary | ICD-10-CM | POA: Diagnosis not present

## 2020-04-24 DIAGNOSIS — J441 Chronic obstructive pulmonary disease with (acute) exacerbation: Secondary | ICD-10-CM

## 2020-04-24 DIAGNOSIS — R600 Localized edema: Secondary | ICD-10-CM

## 2020-04-24 MED ORDER — GUAIFENESIN-CODEINE 100-10 MG/5ML PO SOLN: 10.0000 mL | Freq: Three times a day (TID) | ORAL | 0 refills | Status: DC | PRN

## 2020-04-24 MED ORDER — PREDNISONE 10 MG PO TABS: 10.0000 mg | ORAL_TABLET | Freq: Three times a day (TID) | ORAL | 0 refills | Status: DC

## 2020-04-25 LAB — CBC WITH DIFFERENTIAL/PLATELET
Absolute Monocytes: 972 cells/uL — ABNORMAL HIGH (ref 200–950)
Basophils Absolute: 77 cells/uL (ref 0–200)
Basophils Relative: 0.9 %
Eosinophils Absolute: 404 cells/uL (ref 15–500)
Eosinophils Relative: 4.7 %
HCT: 51 % — ABNORMAL HIGH (ref 38.5–50.0)
Hemoglobin: 17.7 g/dL — ABNORMAL HIGH (ref 13.2–17.1)
Lymphs Abs: 2786 cells/uL (ref 850–3900)
MCH: 33.6 pg — ABNORMAL HIGH (ref 27.0–33.0)
MCHC: 34.7 g/dL (ref 32.0–36.0)
MCV: 96.8 fL (ref 80.0–100.0)
MPV: 10 fL (ref 7.5–12.5)
Monocytes Relative: 11.3 %
Neutro Abs: 4360 cells/uL (ref 1500–7800)
Neutrophils Relative %: 50.7 %
Platelets: 311 10*3/uL (ref 140–400)
RBC: 5.27 10*6/uL (ref 4.20–5.80)
RDW: 12.6 % (ref 11.0–15.0)
Total Lymphocyte: 32.4 %
WBC: 8.6 10*3/uL (ref 3.8–10.8)

## 2020-04-25 LAB — COMPREHENSIVE METABOLIC PANEL
AG Ratio: 1.7 (calc) (ref 1.0–2.5)
ALT: 26 U/L (ref 9–46)
AST: 18 U/L (ref 10–35)
Albumin: 4.5 g/dL (ref 3.6–5.1)
Alkaline phosphatase (APISO): 97 U/L (ref 35–144)
BUN: 9 mg/dL (ref 7–25)
CO2: 25 mmol/L (ref 20–32)
Calcium: 10 mg/dL (ref 8.6–10.3)
Chloride: 105 mmol/L (ref 98–110)
Creat: 0.96 mg/dL (ref 0.70–1.33)
Globulin: 2.7 g/dL (calc) (ref 1.9–3.7)
Glucose, Bld: 70 mg/dL (ref 65–99)
Potassium: 4.4 mmol/L (ref 3.5–5.3)
Sodium: 138 mmol/L (ref 135–146)
Total Bilirubin: 0.8 mg/dL (ref 0.2–1.2)
Total Protein: 7.2 g/dL (ref 6.1–8.1)

## 2020-04-25 LAB — BRAIN NATRIURETIC PEPTIDE: Brain Natriuretic Peptide: 5 pg/mL (ref <100)

## 2020-04-26 LAB — NOVEL CORONAVIRUS, NAA: SARS-CoV-2, NAA: NOT DETECTED

## 2020-04-26 LAB — SARS-COV-2, NAA 2 DAY TAT

## 2020-04-27 DIAGNOSIS — M545 Low back pain, unspecified: Secondary | ICD-10-CM | POA: Diagnosis not present

## 2020-04-30 ENCOUNTER — Encounter: Payer: Self-pay | Admitting: Family Medicine

## 2020-04-30 ENCOUNTER — Other Ambulatory Visit: Payer: Self-pay | Admitting: Family Medicine

## 2020-04-30 DIAGNOSIS — J432 Centrilobular emphysema: Secondary | ICD-10-CM

## 2020-04-30 MED ORDER — DOXYCYCLINE HYCLATE 100 MG PO TABS: 100.0000 mg | ORAL_TABLET | Freq: Two times a day (BID) | ORAL | 0 refills | Status: DC

## 2020-05-01 NOTE — Progress Notes (Signed)
Name: Ronald Ellis   MRN: [id number]    DOB: 12-04-1960   Date:05/02/2020       Progress Note  Subjective  Chief Complaint  Follow up   HPI   Intermittent chest pain: seen by Dr. Saunders Revel,, seems to be related to episodes of bronchitis - feels tight, he has sob but likely from emphysema he stopped taking Atorvastatin because it caused muscle aches   Stress Myoview done 08/05/2019 :    There was no ST segment deviation noted during stress.   No T wave inversion was noted during stress.   The study is normal.   This is a low risk study.   The left ventricular ejection fraction is normal (55-65%).   Suboptimal study due to GI.    Echocardiogram done 08/04/2019  1. Left ventricular ejection fraction, by estimation, is 65 to 70%. The left ventricle has normal function. The left ventricle has no regional wall motion abnormalities. Left ventricular diastolic parameters were normal. 2. Right ventricular systolic function is normal. The right ventricular size is normal. There is normal pulmonary artery systolic pressure. 3. The mitral valve is normal in structure. No evidence of mitral valve regurgitation. No evidence of mitral stenosis. 4. The aortic valve is normal in structure. Aortic valve regurgitation is not visualized. No aortic stenosis is present. 5. The inferior vena cava is normal in size with greater than 50% respiratory variability, suggesting right atrial pressure of 3 mmHg   Emphysema: he smoked cigarettes for about 40 years one pack daily but quit in 2013 , he quit smoking cigars in 2020.Marland Kitchen He was seen by Dr. Mortimer Fries. He is now on 2 liters of nocturnal oxygen. He came in last week with increase in cough and sob, also wheezing, we ordered a CXR but not done yet, he has been taking prednisone and doxy and feels better , however still has a productive cough and increase in wheezing and SOB, he had many flares this winter and we will send him back to pulmonologist   Last CT lung  done 12/16/2019  IMPRESSION:  1. Lung-RADS 2, benign appearance or behavior. Continue annual  screening with low-dose chest CT without contrast in 12 months.  2. Similar appearance of interstitial lung disease, with  differential considerations of nonspecific interstitial pneumonitis  and mild usual interstitial pneumonitis.  3. Aortic Atherosclerosis (ICD10-I70.0) and Emphysema (ICD10-J43.9).   Melanoma in site: seeing Dr. Phillip Heal, removed and had Mohl't surgery on his neck .He also had some other pre-cancerous lesions that were removed on left arm and back He is up to date with full body scan . He is on yearly follow ups now   Headache: he continues to have a dull headache daily - he takes Advil and tylenol daily and may be rebound headache.  He states migraine headaches are about once a week, associated with nausea, throbbing pain, behind right eye and frontal area, he states pain causes vomiting at times, he states maxalt decreases the intensity down to a minimum and he is able to function.  Chronic neck and back pain: he has an episode of radiculitis in March with pain radiating down both legs, doing well on Lyrica, pain is more dull and constant, feels stiff, worse at the end of the day, aggravated with shifting position. He is seeing Ortho and will have steroid on lumbar spine today Taking Tylenol and Advil daily for pain   MDD/seasonal affective disorder: he is compliant with medication, not physically active during winter months,  gaining weight, phq 9 is okay but he is upset about being obese and states plans on being more active during Spring and Summer   History of drug use: when he was an young adult, he got married at age 50 , had 2 kids, when he got a divorce at age 68 , he went wild , he did drugs until age 57, he states the reason he quit was because he did a big snort of cocaine, he fell forward and hit his head , he had a laceration and decided not to do it again. Unchanged    Atherosclerosis of aorta: he tried atorvastatin but caused body aches, also failed Crestor we will switch to Zetia   Patient Active Problem List   Diagnosis Date Noted  . Migraine without aura and without status migrainosus, not intractable 05/02/2020  . Seasonal affective disorder ([place]) 05/02/2020  . History of malignant melanoma of skin 08/03/2019  . Chest pain of uncertain etiology 15/52/0802  . Shortness of breath 06/24/2019  . Atherosclerosis of aorta ([place]) 12/17/2018  . Personal history of tobacco use, presenting hazards to health 12/16/2018  . History of drug dependence/abuse ([place]) 11/26/2018  . Celiac disease 02/05/2018  . COPD (chronic obstructive pulmonary disease) ([place]) 06/29/2017  . Right-sided low back pain with right-sided sciatica 12/05/2015  . Plantar fasciitis of right foot 10/15/2015  . Chronic neck and back pain 07/19/2015  . Fatigue 07/19/2015    Past Surgical History:  Procedure Laterality Date  . BACK SURGERY  2015  . BACK SURGERY    . INGUINAL HERNIA REPAIR Bilateral    multiple times  . lumbar discetomy  2005  . MOLE REMOVAL    . NECK SURGERY  2016  . SPINE SURGERY  12/15/01    Family History  Problem Relation Age of Onset  . Hypertension Father   . Lung disease Father   . Hypertension Brother   . Lung cancer Maternal Grandfather     Social History   Tobacco Use  . Smoking status: Former Smoker    Packs/day: 1.00    Years: 42.00    Pack years: 42.00    Types: Cigarettes    Start date: 96    Quit date: 12/13/2011    Years since quitting: 8.3  . Smokeless tobacco: Never Used  Substance Use Topics  . Alcohol use: Not Currently    Comment: quit 5-6 years ago, only beer - but glutten free diet now     Current Outpatient Medications:  .  albuterol (VENTOLIN HFA) 108 (90 Base) MCG/ACT inhaler, Inhale 2 puffs into the lungs every 6 (six) hours as needed for wheezing or shortness of breath., Disp: 18 g, Rfl: 0 .  Ascorbic Acid (VITAMIN  C PO), Take by mouth., Disp: , Rfl:  .  b complex vitamins tablet, Take 1 tablet by mouth daily., Disp: , Rfl:  .  BIOTIN PO, Take by mouth., Disp: , Rfl:  .  doxycycline (VIBRA-TABS) 100 MG tablet, Take 1 tablet (100 mg total) by mouth 2 (two) times daily., Disp: 14 tablet, Rfl: 0 .  ezetimibe (ZETIA) 10 MG tablet, Take 1 tablet (10 mg total) by mouth daily., Disp: 90 tablet, Rfl: 3 .  guaiFENesin-codeine 100-10 MG/5ML syrup, Take 10 mLs by mouth 3 (three) times daily as needed for cough., Disp: 120 mL, Rfl: 0 .  MAGNESIUM PO, Take by mouth., Disp: , Rfl:  .  predniSONE (DELTASONE) 10 MG tablet, Take 1-2 tablets (10-20 mg total) by  mouth 3 (three) times daily before meals. 2 am 1 at lunch and 1 around 4 pm with food, Disp: 20 tablet, Rfl: 0 .  rizatriptan (MAXALT) 10 MG tablet, Take 1 tablet (10 mg total) by mouth as needed for migraine. May repeat in 2 hours if needed, Disp: 10 tablet, Rfl: 1 .  tiZANidine (ZANAFLEX) 4 MG tablet, Take 1 tablet (4 mg total) by mouth at bedtime., Disp: 90 tablet, Rfl: 1 .  traZODone (DESYREL) 100 MG tablet, Take 1 tablet (100 mg total) by mouth at bedtime. Pt taking 2 tablets qhs, Disp: 90 tablet, Rfl: 0 .  zinc gluconate 50 MG tablet, Take 50 mg by mouth daily., Disp: , Rfl:  .  ARIPiprazole (ABILIFY) 5 MG tablet, Take 1 tablet (5 mg total) by mouth daily., Disp: 90 tablet, Rfl: 1 .  benzonatate (TESSALON) 200 MG capsule, Take 1 capsule (200 mg total) by mouth 3 (three) times daily as needed., Disp: 100 capsule, Rfl: 0 .  DULoxetine (CYMBALTA) 60 MG capsule, Take 1 capsule (60 mg total) by mouth daily., Disp: 90 capsule, Rfl: 1 .  fluticasone (FLONASE) 50 MCG/ACT nasal spray, Place 2 sprays into both nostrils daily., Disp: 48 g, Rfl: 0 .  fluticasone furoate-vilanterol (BREO ELLIPTA) 100-25 MCG/INH AEPB, Inhale 1 puff into the lungs daily., Disp: 180 each, Rfl: 1 .  hydrOXYzine (ATARAX/VISTARIL) 10 MG tablet, Take 1 tablet (10 mg total) by mouth 3 (three) times  daily as needed., Disp: 90 tablet, Rfl: 1 .  pregabalin (LYRICA) 100 MG capsule, Take 1 capsule (100 mg total) by mouth 2 (two) times daily., Disp: 180 capsule, Rfl: 1 .  tiotropium (SPIRIVA HANDIHALER) 18 MCG inhalation capsule, Place 1 capsule (18 mcg total) into inhaler and inhale daily., Disp: 90 capsule, Rfl: 1  Allergies  Allergen Reactions  . Gluten Meal     I personally reviewed active problem list, medication list, allergies, family history, social history, health maintenance with the patient/caregiver today.   ROS  Constitutional: Negative for fever, positive for  weight change.  Respiratory: positive  for cough and shortness of breath.   Cardiovascular: Negative for chest pain or palpitations.  Gastrointestinal: Negative for abdominal pain, no bowel changes.  Musculoskeletal: Negative for gait problem or joint swelling.  Skin: Negative for rash.  Neurological: Negative for dizziness , positiive for intermittent  headache.  No other specific complaints in a complete review of systems (except as listed in HPI above).  Objective  Vitals:   05/02/20 0908 05/02/20 0913  BP: (!) 140/100 140/90  Pulse: 80   Resp: 16   Temp: 98.2 F (36.8 C)   TempSrc: Oral   SpO2: 98%   Weight: 223 lb 6.4 oz (101.3 kg)   Height: 6' (1.829 m)     Body mass index is 30.3 kg/m.  Physical Exam  Constitutional: Patient appears well-developed and well-nourished. Obese  No distress.  HEENT: head atraumatic, normocephalic, pupils equal and reactive to light, neck supple Cardiovascular: Normal rate, regular rhythm and normal heart sounds.  No murmur heard. No BLE edema. Pulmonary/Chest: Effort norma, he has diffuse rhonchi. No respiratory distress. Abdominal: Soft.  There is no tenderness. Psychiatric: Patient has a normal mood and affect. behavior is normal. Judgment and thought content normal.  Recent Results (from the past 2160 hour(s))  Novel Coronavirus, NAA ([facility])     Status:  None   Collection Time: 04/24/20 12:00 AM   Specimen: Nasopharyngeal(NP) swabs in vial transport medium   Nasopharynge  Result  Value Ref Range   SARS-CoV-2, NAA Not Detected Not Detected    Comment: This nucleic acid amplification test was developed and its performance characteristics determined by [employer]. Nucleic acid amplification tests include RT-PCR and TMA. This test has not been FDA cleared or approved. This test has been authorized by FDA under an Emergency Use Authorization (EUA). This test is only authorized for the duration of time the declaration that circumstances exist justifying the authorization of the emergency use of in vitro diagnostic tests for detection of SARS-CoV-2 virus and/or diagnosis of COVID-19 infection under section 564(b)(1) of the Act, 21 U.S.C. 892JJH-4(R) (1), unless the authorization is terminated or revoked sooner. When diagnostic testing is negative, the possibility of a false negative result should be considered in the context of a patient's recent exposures and the presence of clinical signs and symptoms consistent with COVID-19. An individual without symptoms of COVID-19 and who is not shedding SARS-CoV-2 virus wo uld expect to have a negative (not detected) result in this assay.   SARS-COV-2, NAA 2 DAY TAT     Status: None   Collection Time: 04/24/20 12:00 AM   Nasopharynge  Result Value Ref Range   SARS-CoV-2, NAA 2 DAY TAT Performed   Brain natriuretic peptide     Status: None   Collection Time: 04/24/20  4:17 PM  Result Value Ref Range   Brain Natriuretic Peptide 5 <100 pg/mL    Comment: . BNP levels increase with age in the general population with the highest values seen in individuals greater than 55 years of age. Reference: J. Am. [place] Ar. Cardiol. 2002; 74:081-448. Marland Kitchen   CBC with Differential/Platelet     Status: Abnormal   Collection Time: 04/24/20  4:17 PM  Result Value Ref Range   WBC 8.6 3.8 - 10.8 Thousand/uL    RBC 5.27 4.20 - 5.80 Million/uL   Hemoglobin 17.7 (H) 13.2 - 17.1 g/dL   HCT 51.0 (H) 38.5 - 50.0 %   MCV 96.8 80.0 - 100.0 fL   MCH 33.6 (H) 27.0 - 33.0 pg   MCHC 34.7 32.0 - 36.0 g/dL   RDW 12.6 11.0 - 15.0 %   Platelets 311 140 - 400 Thousand/uL   MPV 10.0 7.5 - 12.5 fL   Neutro Abs 4,360 1,500 - 7,800 cells/uL   Lymphs Abs 2,786 850 - 3,900 cells/uL   Absolute Monocytes 972 (H) 200 - 950 cells/uL   Eosinophils Absolute 404 15 - 500 cells/uL   Basophils Absolute 77 0 - 200 cells/uL   Neutrophils Relative % 50.7 %   Total Lymphocyte 32.4 %   Monocytes Relative 11.3 %   Eosinophils Relative 4.7 %   Basophils Relative 0.9 %  Comprehensive metabolic panel     Status: None   Collection Time: 04/24/20  4:17 PM  Result Value Ref Range   Glucose, Bld 70 65 - 99 mg/dL    Comment: .            Fasting reference interval .    BUN 9 7 - 25 mg/dL   Creat 0.96 0.70 - 1.33 mg/dL    Comment: For patients >65 years of age, the reference limit for Creatinine is approximately 13% higher for people identified as African-American. .    BUN/Creatinine Ratio NOT APPLICABLE 6 - 22 (calc)   Sodium 138 135 - 146 mmol/L   Potassium 4.4 3.5 - 5.3 mmol/L   Chloride 105 98 - 110 mmol/L   CO2 25 20 - 32 mmol/L  Calcium 10.0 8.6 - 10.3 mg/dL   Total Protein 7.2 6.1 - 8.1 g/dL   Albumin 4.5 3.6 - 5.1 g/dL   Globulin 2.7 1.9 - 3.7 g/dL (calc)   AG Ratio 1.7 1.0 - 2.5 (calc)   Total Bilirubin 0.8 0.2 - 1.2 mg/dL   Alkaline phosphatase (APISO) 97 35 - 144 U/L   AST 18 10 - 35 U/L   ALT 26 9 - 46 U/L     PHQ2/9: Depression screen [facility] 2/9 05/02/2020 04/24/2020 02/15/2020 02/02/2020 01/02/2020  Decreased Interest 1 2 1  0 1  Down, Depressed, Hopeless 0 1 1 0 1  PHQ - 2 Score 1 3 2  0 2  Altered sleeping 0 2 3 - 1  Tired, decreased energy 1 2 0 - 1  Change in appetite 0 2 0 - 1  Feeling bad or failure about yourself  0 0 0 - 1  Trouble concentrating 0 0 0 - 1  Moving slowly or fidgety/restless 0 0  0 - 2  Suicidal thoughts 0 0 0 - 1  PHQ-9 Score 2 9 5  - 10  Difficult doing work/chores - Somewhat difficult Not difficult at all - Somewhat difficult  Some recent data might be hidden    phq 9 is positive   Fall Risk: Fall Risk  05/02/2020 04/24/2020 02/15/2020 02/02/2020 01/02/2020  Falls in the past year? 0 0 0 0 0  Number falls in past yr: 0 0 0 0 0  Injury with Fall? 0 0 0 0 0  Risk for fall due to : - - - No Fall Risks -  Follow up - - - Falls prevention discussed -     Functional Status Survey: Is the patient deaf or have difficulty hearing?: No Does the patient have difficulty seeing, even when wearing glasses/contacts?: Yes Does the patient have difficulty concentrating, remembering, or making decisions?: No Does the patient have difficulty walking or climbing stairs?: Yes Does the patient have difficulty dressing or bathing?: No Does the patient have difficulty doing errands alone such as visiting a doctor's office or shopping?: No    Assessment & Plan  1. Centrilobular emphysema (HCC)  - tiotropium (SPIRIVA HANDIHALER) 18 MCG inhalation capsule; Place 1 capsule (18 mcg total) into inhaler and inhale daily.  Dispense: 90 capsule; Refill: 1 - fluticasone furoate-vilanterol (BREO ELLIPTA) 100-25 MCG/INH AEPB; Inhale 1 puff into the lungs daily.  Dispense: 180 each; Refill: 1 - benzonatate (TESSALON) 200 MG capsule; Take 1 capsule (200 mg total) by mouth 3 (three) times daily as needed.  Dispense: 100 capsule; Refill: 0  2. Migraine without aura and with status migrainosus, not intractable  - pregabalin (LYRICA) 100 MG capsule; Take 1 capsule (100 mg total) by mouth 2 (two) times daily.  Dispense: 180 capsule; Refill: 1  3. Dyslipidemia   We will try zetia, intolerant to statin therapy   4. Atherosclerosis of aorta ([place])   5. Seasonal affective disorder (HCC)  - ARIPiprazole (ABILIFY) 5 MG tablet; Take 1 tablet (5 mg total) by mouth daily.  Dispense: 90 tablet;  Refill: 1  6. Insomnia, unspecified type   7. Chronic neck and back pain  - DULoxetine (CYMBALTA) 60 MG capsule; Take 1 capsule (60 mg total) by mouth daily.  Dispense: 90 capsule; Refill: 1 - pregabalin (LYRICA) 100 MG capsule; Take 1 capsule (100 mg total) by mouth 2 (two) times daily.  Dispense: 180 capsule; Refill: 1  8. Outbursts of anger  - hydrOXYzine (ATARAX/VISTARIL) 10 MG  tablet; Take 1 tablet (10 mg total) by mouth 3 (three) times daily as needed.  Dispense: 90 tablet; Refill: 1  9. Moderate major depression (HCC)  - hydrOXYzine (ATARAX/VISTARIL) 10 MG tablet; Take 1 tablet (10 mg total) by mouth 3 (three) times daily as needed.  Dispense: 90 tablet; Refill: 1 - ARIPiprazole (ABILIFY) 5 MG tablet; Take 1 tablet (5 mg total) by mouth daily.  Dispense: 90 tablet; Refill: 1  10. Migraine without aura and without status migrainosus, not intractable   11. Perennial allergic rhinitis with seasonal variation  - fluticasone (FLONASE) 50 MCG/ACT nasal spray; Place 2 sprays into both nostrils daily.  Dispense: 48 g; Refill: 0

## 2020-05-02 ENCOUNTER — Ambulatory Visit (INDEPENDENT_AMBULATORY_CARE_PROVIDER_SITE_OTHER): Payer: Medicare Other | Admitting: Family Medicine

## 2020-05-02 ENCOUNTER — Encounter: Payer: Self-pay | Admitting: Family Medicine

## 2020-05-02 ENCOUNTER — Other Ambulatory Visit: Payer: Self-pay

## 2020-05-02 VITALS — BP 140/90 | HR 80 | Temp 98.2°F | Resp 16 | Ht 72.0 in | Wt 223.4 lb

## 2020-05-02 DIAGNOSIS — G4700 Insomnia, unspecified: Secondary | ICD-10-CM

## 2020-05-02 DIAGNOSIS — R454 Irritability and anger: Secondary | ICD-10-CM

## 2020-05-02 DIAGNOSIS — E785 Hyperlipidemia, unspecified: Secondary | ICD-10-CM | POA: Diagnosis not present

## 2020-05-02 DIAGNOSIS — G43009 Migraine without aura, not intractable, without status migrainosus: Secondary | ICD-10-CM | POA: Insufficient documentation

## 2020-05-02 DIAGNOSIS — G43001 Migraine without aura, not intractable, with status migrainosus: Secondary | ICD-10-CM | POA: Diagnosis not present

## 2020-05-02 DIAGNOSIS — G8929 Other chronic pain: Secondary | ICD-10-CM

## 2020-05-02 DIAGNOSIS — M5416 Radiculopathy, lumbar region: Secondary | ICD-10-CM | POA: Diagnosis not present

## 2020-05-02 DIAGNOSIS — J432 Centrilobular emphysema: Secondary | ICD-10-CM | POA: Diagnosis not present

## 2020-05-02 DIAGNOSIS — I700 Atherosclerosis of aorta: Secondary | ICD-10-CM

## 2020-05-02 DIAGNOSIS — M542 Cervicalgia: Secondary | ICD-10-CM

## 2020-05-02 DIAGNOSIS — M549 Dorsalgia, unspecified: Secondary | ICD-10-CM

## 2020-05-02 DIAGNOSIS — J3089 Other allergic rhinitis: Secondary | ICD-10-CM | POA: Diagnosis not present

## 2020-05-02 DIAGNOSIS — F338 Other recurrent depressive disorders: Secondary | ICD-10-CM | POA: Insufficient documentation

## 2020-05-02 DIAGNOSIS — J302 Other seasonal allergic rhinitis: Secondary | ICD-10-CM

## 2020-05-02 DIAGNOSIS — F321 Major depressive disorder, single episode, moderate: Secondary | ICD-10-CM

## 2020-05-02 MED ORDER — BREO ELLIPTA 100-25 MCG/INH IN AEPB: 1.0000 | INHALATION_SPRAY | Freq: Every day | RESPIRATORY_TRACT | 1 refills | Status: DC

## 2020-05-02 MED ORDER — PREGABALIN 100 MG PO CAPS: 100.0000 mg | ORAL_CAPSULE | Freq: Two times a day (BID) | ORAL | 1 refills | Status: DC

## 2020-05-02 MED ORDER — DULOXETINE HCL 60 MG PO CPEP: 60.0000 mg | ORAL_CAPSULE | Freq: Every day | ORAL | 1 refills | Status: DC

## 2020-05-02 MED ORDER — HYDROXYZINE HCL 10 MG PO TABS: 10.0000 mg | ORAL_TABLET | Freq: Three times a day (TID) | ORAL | 1 refills | Status: DC | PRN

## 2020-05-02 MED ORDER — ARIPIPRAZOLE 5 MG PO TABS: 5.0000 mg | ORAL_TABLET | Freq: Every day | ORAL | 1 refills | Status: DC

## 2020-05-02 MED ORDER — FLUTICASONE PROPIONATE 50 MCG/ACT NA SUSP: 2.0000 | Freq: Every day | NASAL | 0 refills | Status: DC

## 2020-05-02 MED ORDER — SPIRIVA HANDIHALER 18 MCG IN CAPS: 18.0000 ug | ORAL_CAPSULE | Freq: Every day | RESPIRATORY_TRACT | 1 refills | Status: DC

## 2020-05-02 MED ORDER — BENZONATATE 200 MG PO CAPS: 200.0000 mg | ORAL_CAPSULE | Freq: Three times a day (TID) | ORAL | 0 refills | Status: DC | PRN

## 2020-05-02 MED ORDER — EZETIMIBE 10 MG PO TABS: 10.0000 mg | ORAL_TABLET | Freq: Every day | ORAL | 3 refills | Status: DC

## 2020-05-13 DIAGNOSIS — J449 Chronic obstructive pulmonary disease, unspecified: Secondary | ICD-10-CM | POA: Diagnosis not present

## 2020-05-16 ENCOUNTER — Telehealth: Payer: Self-pay

## 2020-05-16 ENCOUNTER — Other Ambulatory Visit: Payer: Self-pay

## 2020-05-16 DIAGNOSIS — J432 Centrilobular emphysema: Secondary | ICD-10-CM

## 2020-05-16 NOTE — Telephone Encounter (Signed)
Copied from [place] [phone]. Topic: General - Other >> May 16, 2020 12:01 PM Oneta Rack wrote: Patient would like pulmonologist referral sent to [facility] pulmonologist office in  [place] in the hospital. Patient states [place] is to far.

## 2020-05-16 NOTE — Telephone Encounter (Signed)
Patient was originally referred to the [facility] in [place] in which he stated that they never reached out to him, but after finding out that they were not loca he requested to use a [place] office. I switched the referral from the [place] location to the [place] location.   I informed him that they typically will give him a call to schedule within 2 days but if not to give me a call back so I can look into it and he agreed.

## 2020-05-25 DIAGNOSIS — M5416 Radiculopathy, lumbar region: Secondary | ICD-10-CM | POA: Diagnosis not present

## 2020-06-12 ENCOUNTER — Ambulatory Visit: Payer: Medicare Other | Admitting: Primary Care

## 2020-06-12 NOTE — Progress Notes (Deleted)
@Patient  ID: Ronald Ellis, male    DOB: 1960-05-25, 60 y.o.   MRN: [id number]  No chief complaint on file.   Referring provider: Steele Sizer, MD  HPI: 60 year old male, former smoker quit in 2013 (42 pack year hx). PMH significant for COPD, atherosclerosis, celiac disease, melanoma, seasonal affective disorder, chronic pain. Patient of Dr. Mortimer Ellis, seen for initial consult on 06/21/19. Maintained on Breo 100 and Spiriva. Ordered for ONO, 6MWT and PFTs.   Previous [facility] pulmonary encounter: 60 year old pleasant white male seen today for assessment for COPD January 2021 patient developed loss of smell and loss of taste patient was tested for Covid 3 times but was tested negative Patient has been on and off antibiotics and prednisone over the last several months Patient with ongoing symptoms of wheezing cough shortness of breath and dyspnea exertion  At this time I explained to the patient he probably had diagnosis of COVID-19 pneumonia although his test was negative  At this time patient does have baseline shortness of breath and dyspnea exertion He does not have any active wheezing at this time or cough No signs of exacerbation at this time  Patient is a former smoker 30-pack-year smoker 1 pack a day for the last 30 years Patient continues to smoke cigars but quit 5 weeks ago  I have showed the CT images to the patient  Patient currently on Breo and also on Spiriva HandiHaler These inhalers do help his breathing  Patient is enrolled in lung cancer screening protocol   06/12/2020- Interim hx Patient presents today for 1 year follow-up COPD. Needs ambulatory walk test.         Allergies  Allergen Reactions  . Gluten Meal     Immunization History  Administered Date(s) Administered  . Influenza,inj,Quad PF,6+ Mos 04/27/2019, 01/02/2020  . PFIZER(Purple Top)SARS-COV-2 Vaccination 05/26/2019, 06/20/2019, 02/14/2020  . Pneumococcal Conjugate-13 01/02/2020  . Pneumococcal  Polysaccharide-23 06/29/2017  . Tdap 11/26/2018    Past Medical History:  Diagnosis Date  . Arthritis   . Chronic pain   . COPD (chronic obstructive pulmonary disease) ([place])   . Depression 08/06/81  . Gluten intolerance   . Oxygen deficiency   . Pneumonia     Tobacco History: Social History   Tobacco Use  Smoking Status Former Smoker  . Packs/day: 1.00  . Years: 42.00  . Pack years: 42.00  . Types: Cigarettes  . Start date: 23  . Quit date: 12/13/2011  . Years since quitting: 8.5  Smokeless Tobacco Never Used   Counseling given: Not Answered   Outpatient Medications Prior to Visit  Medication Sig Dispense Refill  . albuterol (VENTOLIN HFA) 108 (90 Base) MCG/ACT inhaler Inhale 2 puffs into the lungs every 6 (six) hours as needed for wheezing or shortness of breath. 18 g 0  . ARIPiprazole (ABILIFY) 5 MG tablet Take 1 tablet (5 mg total) by mouth daily. 90 tablet 1  . Ascorbic Acid (VITAMIN C PO) Take by mouth.    Marland Kitchen b complex vitamins tablet Take 1 tablet by mouth daily.    . benzonatate (TESSALON) 200 MG capsule Take 1 capsule (200 mg total) by mouth 3 (three) times daily as needed. 100 capsule 0  . BIOTIN PO Take by mouth.    . doxycycline (VIBRA-TABS) 100 MG tablet Take 1 tablet (100 mg total) by mouth 2 (two) times daily. 14 tablet 0  . DULoxetine (CYMBALTA) 60 MG capsule Take 1 capsule (60 mg total) by mouth daily. 90 capsule  1  . ezetimibe (ZETIA) 10 MG tablet Take 1 tablet (10 mg total) by mouth daily. 90 tablet 3  . fluticasone (FLONASE) 50 MCG/ACT nasal spray Place 2 sprays into both nostrils daily. 48 g 0  . fluticasone furoate-vilanterol (BREO ELLIPTA) 100-25 MCG/INH AEPB Inhale 1 puff into the lungs daily. 180 each 1  . guaiFENesin-codeine 100-10 MG/5ML syrup Take 10 mLs by mouth 3 (three) times daily as needed for cough. 120 mL 0  . hydrOXYzine (ATARAX/VISTARIL) 10 MG tablet Take 1 tablet (10 mg total) by mouth 3 (three) times daily as needed. 90 tablet 1  .  MAGNESIUM PO Take by mouth.    . predniSONE (DELTASONE) 10 MG tablet Take 1-2 tablets (10-20 mg total) by mouth 3 (three) times daily before meals. 2 am 1 at lunch and 1 around 4 pm with food 20 tablet 0  . pregabalin (LYRICA) 100 MG capsule Take 1 capsule (100 mg total) by mouth 2 (two) times daily. 180 capsule 1  . rizatriptan (MAXALT) 10 MG tablet Take 1 tablet (10 mg total) by mouth as needed for migraine. May repeat in 2 hours if needed 10 tablet 1  . tiotropium (SPIRIVA HANDIHALER) 18 MCG inhalation capsule Place 1 capsule (18 mcg total) into inhaler and inhale daily. 90 capsule 1  . tiZANidine (ZANAFLEX) 4 MG tablet Take 1 tablet (4 mg total) by mouth at bedtime. 90 tablet 1  . traZODone (DESYREL) 100 MG tablet Take 1 tablet (100 mg total) by mouth at bedtime. Pt taking 2 tablets qhs 90 tablet 0  . zinc gluconate 50 MG tablet Take 50 mg by mouth daily.     No facility-administered medications prior to visit.      Review of Systems  Review of Systems   Physical Exam  There were no vitals taken for this visit. Physical Exam   Lab Results:  CBC    Component Value Date/Time   WBC 8.6 04/24/2020 1617   RBC 5.27 04/24/2020 1617   HGB 17.7 (H) 04/24/2020 1617   HGB 15.5 05/12/2012 1359   HCT 51.0 (H) 04/24/2020 1617   HCT 46.0 05/12/2012 1359   PLT 311 04/24/2020 1617   PLT 349 05/12/2012 1359   MCV 96.8 04/24/2020 1617   MCV 100 05/12/2012 1359   MCH 33.6 (H) 04/24/2020 1617   MCHC 34.7 04/24/2020 1617   RDW 12.6 04/24/2020 1617   RDW 13.3 05/12/2012 1359   LYMPHSABS 2,786 04/24/2020 1617   LYMPHSABS 2.3 05/12/2012 1359   MONOABS 624 01/16/2016 1032   MONOABS 0.8 05/12/2012 1359   EOSABS 404 04/24/2020 1617   EOSABS 0.2 05/12/2012 1359   BASOSABS 77 04/24/2020 1617   BASOSABS 0.1 05/12/2012 1359    BMET    Component Value Date/Time   NA 138 04/24/2020 1617   NA 138 05/12/2012 1359   K 4.4 04/24/2020 1617   K 4.0 05/12/2012 1359   CL 105 04/24/2020 1617    CL 107 05/12/2012 1359   CO2 25 04/24/2020 1617   CO2 28 05/12/2012 1359   GLUCOSE 70 04/24/2020 1617   GLUCOSE 65 05/12/2012 1359   BUN 9 04/24/2020 1617   BUN 12 05/12/2012 1359   CREATININE 0.96 04/24/2020 1617   CALCIUM 10.0 04/24/2020 1617   CALCIUM 8.8 05/12/2012 1359   GFRNONAA 69 05/30/2019 1108   GFRAA 80 05/30/2019 1108    BNP    Component Value Date/Time   BNP 5 04/24/2020 1617    ProBNP No results  found for: PROBNP  Imaging: No results found.   Assessment & Plan:   No problem-specific Assessment & Plan notes found for this encounter.     Martyn Ehrich, NP 06/12/2020

## 2020-06-13 DIAGNOSIS — J449 Chronic obstructive pulmonary disease, unspecified: Secondary | ICD-10-CM | POA: Diagnosis not present

## 2020-06-25 ENCOUNTER — Encounter: Payer: Self-pay | Admitting: Primary Care

## 2020-06-25 ENCOUNTER — Telehealth: Payer: Self-pay | Admitting: Primary Care

## 2020-06-25 ENCOUNTER — Ambulatory Visit: Payer: Medicare Other | Admitting: Primary Care

## 2020-06-25 ENCOUNTER — Ambulatory Visit (INDEPENDENT_AMBULATORY_CARE_PROVIDER_SITE_OTHER): Payer: Medicare Other

## 2020-06-25 ENCOUNTER — Other Ambulatory Visit: Payer: Self-pay

## 2020-06-25 VITALS — BP 118/78 | HR 95 | Temp 98.0°F | Ht 72.0 in | Wt 217.2 lb

## 2020-06-25 DIAGNOSIS — J441 Chronic obstructive pulmonary disease with (acute) exacerbation: Secondary | ICD-10-CM

## 2020-06-25 DIAGNOSIS — R21 Rash and other nonspecific skin eruption: Secondary | ICD-10-CM | POA: Diagnosis not present

## 2020-06-25 DIAGNOSIS — L821 Other seborrheic keratosis: Secondary | ICD-10-CM | POA: Diagnosis not present

## 2020-06-25 DIAGNOSIS — R059 Cough, unspecified: Secondary | ICD-10-CM | POA: Diagnosis not present

## 2020-06-25 DIAGNOSIS — L918 Other hypertrophic disorders of the skin: Secondary | ICD-10-CM | POA: Diagnosis not present

## 2020-06-25 DIAGNOSIS — G4734 Idiopathic sleep related nonobstructive alveolar hypoventilation: Secondary | ICD-10-CM | POA: Insufficient documentation

## 2020-06-25 DIAGNOSIS — J9811 Atelectasis: Secondary | ICD-10-CM | POA: Diagnosis not present

## 2020-06-25 DIAGNOSIS — Z86018 Personal history of other benign neoplasm: Secondary | ICD-10-CM | POA: Diagnosis not present

## 2020-06-25 DIAGNOSIS — D485 Neoplasm of uncertain behavior of skin: Secondary | ICD-10-CM | POA: Diagnosis not present

## 2020-06-25 DIAGNOSIS — L578 Other skin changes due to chronic exposure to nonionizing radiation: Secondary | ICD-10-CM | POA: Diagnosis not present

## 2020-06-25 DIAGNOSIS — Z85820 Personal history of malignant melanoma of skin: Secondary | ICD-10-CM | POA: Diagnosis not present

## 2020-06-25 DIAGNOSIS — D2239 Melanocytic nevi of other parts of face: Secondary | ICD-10-CM | POA: Diagnosis not present

## 2020-06-25 MED ORDER — PREDNISONE 10 MG PO TABS: ORAL_TABLET | ORAL | 0 refills | Status: DC

## 2020-06-25 MED ORDER — AMOXICILLIN-POT CLAVULANATE 875-125 MG PO TABS: 1.0000 | ORAL_TABLET | Freq: Two times a day (BID) | ORAL | 0 refills | Status: DC

## 2020-06-25 NOTE — Assessment & Plan Note (Addendum)
-   Cough x 1 week with associated fatigue, low grade temp and sob - Lungs with scattered rhonchi and wheezing - Checking CXR to r/o pneumonia - Sending in RX for Augmentin 1 tab BID x 7 days and prednisone taper  - Continue Breo+ Spiriva, prn Albuterol, mucinex 600-1247m twice daily  - FU 6 months or if symptoms do not improve/ may need repeat CXR in 4-6 weeks depending on results

## 2020-06-25 NOTE — Assessment & Plan Note (Addendum)
-   ONO 06/10/19 showed SpO2 low 71%, baseline 89% RA. Wearing 2.5L oxygen at night  - Needs repeat ONO to re-qualify for nocturnal oxygen

## 2020-06-25 NOTE — Telephone Encounter (Signed)
Patient had chronic interstitial coarsening and bronchic changes on CXR . No acute findings of pneumonia.   He is to complete Augmentin and prednisone for bronchitis. I ordered ONO on room air to renew oxygen. Needs follow-up with Dr. Mortimer Fries first available d/t ILD findings.

## 2020-06-25 NOTE — Patient Instructions (Addendum)
Recommendations: Continue Breo and Spiriva as directed Use albuterol 4-6 hours as needed for shortness of breath/wheezing  Continue Mucinex 600-1261m twice daily Continue nasal spray  Orders: CXR today  ONO re: nocturnal hypoxia   Follow-up: 6 months month with Dr. KMortimer Fries

## 2020-06-25 NOTE — Progress Notes (Signed)
@Patient  ID: Ronald Ellis, male    DOB: 09-09-1960, 60 y.o.   MRN: [id number]  Chief Complaint  Patient presents with  . Follow-up    Referring provider: Steele Sizer, MD  HPI: 60 year old male, former smoker quit in 2013 (42 pack year hx). PMH significant for COPD, atherosclerosis, celiac disease, melanoma, seasonal affective disorder, chronic pain. Patient of Dr. Mortimer Fries, seen for initial consult on 06/21/19. Maintained on Breo 100 and Spiriva. Ordered for ONO, 6MWT and PFTs.   Previous [facility] pulmonary encounter: 60 year old pleasant white male seen today for assessment for COPD January 2021 patient developed loss of smell and loss of taste patient was tested for Covid 3 times but was tested negative Patient has been on and off antibiotics and prednisone over the last several months Patient with ongoing symptoms of wheezing cough shortness of breath and dyspnea exertion  At this time I explained to the patient he probably had diagnosis of COVID-19 pneumonia although his test was negative  At this time patient does have baseline shortness of breath and dyspnea exertion He does not have any active wheezing at this time or cough No signs of exacerbation at this time  Patient is a former smoker 30-pack-year smoker 1 pack a day for the last 30 years Patient continues to smoke cigars but quit 5 weeks ago  I have showed the CT images to the patient  Patient currently on Breo and also on Spiriva HandiHaler These inhalers do help his breathing  Patient is enrolled in lung cancer screening protocol   06/25/2020- Interim hx Patient presents today for 1 year follow-up COPD. Needs to re-qualify for nocturnal oxygen. Patient reports persistent cough x 1 week. Yesterday he had symptoms of sob, cough, fatigue, low grade temp, chills. He used albuterol 2-3 times yesteday with improvement. He feels better today. He is compliant with Breo and Spiriva as prescribed. He has been taking mucinex twice a  day and using OTC nasal sprya. He wears 2.5L oxygen at night, needs repeat ONO. DME is [facility]    Allergies  Allergen Reactions  . Gluten Meal     Immunization History  Administered Date(s) Administered  . Influenza,inj,Quad PF,6+ Mos 04/27/2019, 01/02/2020  . PFIZER(Purple Top)SARS-COV-2 Vaccination 05/26/2019, 06/20/2019, 02/14/2020  . Pneumococcal Conjugate-13 01/02/2020  . Pneumococcal Polysaccharide-23 06/29/2017  . Tdap 11/26/2018    Past Medical History:  Diagnosis Date  . Arthritis   . Chronic pain   . COPD (chronic obstructive pulmonary disease) ([place])   . Depression 08/06/81  . Gluten intolerance   . Oxygen deficiency   . Pneumonia     Tobacco History: Social History   Tobacco Use  Smoking Status Former Smoker  . Packs/day: 1.00  . Years: 42.00  . Pack years: 42.00  . Types: Cigarettes  . Start date: 40  . Quit date: 12/13/2011  . Years since quitting: 8.5  Smokeless Tobacco Never Used   Counseling given: Not Answered   Outpatient Medications Prior to Visit  Medication Sig Dispense Refill  . albuterol (VENTOLIN HFA) 108 (90 Base) MCG/ACT inhaler Inhale 2 puffs into the lungs every 6 (six) hours as needed for wheezing or shortness of breath. 18 g 0  . ARIPiprazole (ABILIFY) 5 MG tablet Take 1 tablet (5 mg total) by mouth daily. 90 tablet 1  . Ascorbic Acid (VITAMIN C PO) Take by mouth.    Marland Kitchen b complex vitamins tablet Take 1 tablet by mouth daily.    . benzonatate (TESSALON) 200 MG  capsule Take 1 capsule (200 mg total) by mouth 3 (three) times daily as needed. 100 capsule 0  . BIOTIN PO Take by mouth.    . DULoxetine (CYMBALTA) 60 MG capsule Take 1 capsule (60 mg total) by mouth daily. 90 capsule 1  . ezetimibe (ZETIA) 10 MG tablet Take 1 tablet (10 mg total) by mouth daily. 90 tablet 3  . fluticasone (FLONASE) 50 MCG/ACT nasal spray Place 2 sprays into both nostrils daily. 48 g 0  . fluticasone furoate-vilanterol (BREO ELLIPTA) 100-25 MCG/INH AEPB Inhale  1 puff into the lungs daily. 180 each 1  . guaiFENesin-codeine 100-10 MG/5ML syrup Take 10 mLs by mouth 3 (three) times daily as needed for cough. 120 mL 0  . hydrOXYzine (ATARAX/VISTARIL) 10 MG tablet Take 1 tablet (10 mg total) by mouth 3 (three) times daily as needed. 90 tablet 1  . MAGNESIUM PO Take by mouth.    . pregabalin (LYRICA) 100 MG capsule Take 1 capsule (100 mg total) by mouth 2 (two) times daily. 180 capsule 1  . rizatriptan (MAXALT) 10 MG tablet Take 1 tablet (10 mg total) by mouth as needed for migraine. May repeat in 2 hours if needed 10 tablet 1  . tiotropium (SPIRIVA HANDIHALER) 18 MCG inhalation capsule Place 1 capsule (18 mcg total) into inhaler and inhale daily. 90 capsule 1  . tiZANidine (ZANAFLEX) 4 MG tablet Take 1 tablet (4 mg total) by mouth at bedtime. 90 tablet 1  . traZODone (DESYREL) 100 MG tablet Take 1 tablet (100 mg total) by mouth at bedtime. Pt taking 2 tablets qhs 90 tablet 0  . zinc gluconate 50 MG tablet Take 50 mg by mouth daily.    Marland Kitchen doxycycline (VIBRA-TABS) 100 MG tablet Take 1 tablet (100 mg total) by mouth 2 (two) times daily. 14 tablet 0  . predniSONE (DELTASONE) 10 MG tablet Take 1-2 tablets (10-20 mg total) by mouth 3 (three) times daily before meals. 2 am 1 at lunch and 1 around 4 pm with food 20 tablet 0   No facility-administered medications prior to visit.   Review of Systems  Review of Systems  Constitutional: Positive for chills and fatigue.  HENT: Positive for congestion and postnasal drip.   Respiratory: Positive for cough and shortness of breath.   Cardiovascular: Positive for leg swelling.   Physical Exam  BP 118/78 (BP Location: Left Arm, Cuff Size: Normal)   Pulse 95   Temp 98 F (36.7 C) (Oral)   Ht 6' (1.829 m)   Wt 217 lb 3.2 oz (98.5 kg)   SpO2 96%   BMI 29.46 kg/m  Physical Exam Constitutional:      Appearance: Normal appearance.  HENT:     Head: Normocephalic and atraumatic.     Mouth/Throat:     Comments:  Deferred d/t masking Cardiovascular:     Rate and Rhythm: Normal rate and regular rhythm.  Pulmonary:     Breath sounds: Wheezing and rhonchi present.  Musculoskeletal:        General: Normal range of motion.  Skin:    General: Skin is warm and dry.  Neurological:     Mental Status: He is alert.  Psychiatric:        Mood and Affect: Mood normal.        Behavior: Behavior normal.        Thought Content: Thought content normal.        Judgment: Judgment normal.      Lab Results:  CBC    Component Value Date/Time   WBC 8.6 04/24/2020 1617   RBC 5.27 04/24/2020 1617   HGB 17.7 (H) 04/24/2020 1617   HGB 15.5 05/12/2012 1359   HCT 51.0 (H) 04/24/2020 1617   HCT 46.0 05/12/2012 1359   PLT 311 04/24/2020 1617   PLT 349 05/12/2012 1359   MCV 96.8 04/24/2020 1617   MCV 100 05/12/2012 1359   MCH 33.6 (H) 04/24/2020 1617   MCHC 34.7 04/24/2020 1617   RDW 12.6 04/24/2020 1617   RDW 13.3 05/12/2012 1359   LYMPHSABS 2,786 04/24/2020 1617   LYMPHSABS 2.3 05/12/2012 1359   MONOABS 624 01/16/2016 1032   MONOABS 0.8 05/12/2012 1359   EOSABS 404 04/24/2020 1617   EOSABS 0.2 05/12/2012 1359   BASOSABS 77 04/24/2020 1617   BASOSABS 0.1 05/12/2012 1359    BMET    Component Value Date/Time   NA 138 04/24/2020 1617   NA 138 05/12/2012 1359   K 4.4 04/24/2020 1617   K 4.0 05/12/2012 1359   CL 105 04/24/2020 1617   CL 107 05/12/2012 1359   CO2 25 04/24/2020 1617   CO2 28 05/12/2012 1359   GLUCOSE 70 04/24/2020 1617   GLUCOSE 65 05/12/2012 1359   BUN 9 04/24/2020 1617   BUN 12 05/12/2012 1359   CREATININE 0.96 04/24/2020 1617   CALCIUM 10.0 04/24/2020 1617   CALCIUM 8.8 05/12/2012 1359   GFRNONAA 69 05/30/2019 1108   GFRAA 80 05/30/2019 1108    BNP    Component Value Date/Time   BNP 5 04/24/2020 1617    ProBNP No results found for: PROBNP  Imaging: No results found.   Assessment & Plan:   COPD with acute exacerbation (HCC) - Cough x 1 week with associated  fatigue, low grade temp and sob - Lungs with scattered rhonchi and wheezing - Checking CXR to r/o pneumonia - Sending in RX for Augmentin 1 tab BID x 7 days and prednisone taper  - Continue Breo+ Spiriva, prn Albuterol, mucinex 600-1254m twice daily  - FU 6 months or if symptoms do not improve/ may need repeat CXR in 4-6 weeks depending on results  Nocturnal hypoxia - ONO 06/10/19 showed SpO2 low 71%, baseline 89% RA. Wearing 2.5L oxygen at night  - Needs repeat ONO to re-qualify for nocturnal oxygen    EMartyn Ehrich NP 06/25/2020

## 2020-06-26 NOTE — Telephone Encounter (Signed)
appt scheduled for 08/22/2020 at 1:30. Patient is aware and voiced his understanding.  Nothing further needed at this time.

## 2020-07-02 DIAGNOSIS — G4730 Sleep apnea, unspecified: Secondary | ICD-10-CM | POA: Diagnosis not present

## 2020-07-02 DIAGNOSIS — R0683 Snoring: Secondary | ICD-10-CM | POA: Diagnosis not present

## 2020-07-04 ENCOUNTER — Telehealth: Payer: Self-pay | Admitting: Internal Medicine

## 2020-07-04 NOTE — Telephone Encounter (Signed)
ONO reviewed by Dr. Jackie Plum 1L QHS. Lowest spo2 87%/   Lm for patient.

## 2020-07-05 ENCOUNTER — Telehealth: Payer: Self-pay | Admitting: Internal Medicine

## 2020-07-05 DIAGNOSIS — J9611 Chronic respiratory failure with hypoxia: Secondary | ICD-10-CM

## 2020-07-05 NOTE — Telephone Encounter (Signed)
Lm x2 for patient.  Letter has been mailed to address on file.  Will close encounter per protocol.

## 2020-07-05 NOTE — Telephone Encounter (Signed)
Me   3:48 PM Note ONO reviewed by Dr. Jackie Plum 1L QHS. Lowest spo2 87%/   Lm for patient.       Patient is aware of results and voiced her understanding.  Order has bene placed to [facility] for 1L qhs.  Nothing further needed at this time.

## 2020-07-13 DIAGNOSIS — J449 Chronic obstructive pulmonary disease, unspecified: Secondary | ICD-10-CM | POA: Diagnosis not present

## 2020-08-13 DIAGNOSIS — J449 Chronic obstructive pulmonary disease, unspecified: Secondary | ICD-10-CM | POA: Diagnosis not present

## 2020-08-22 ENCOUNTER — Other Ambulatory Visit: Payer: Self-pay

## 2020-08-22 ENCOUNTER — Encounter: Payer: Self-pay | Admitting: Internal Medicine

## 2020-08-22 ENCOUNTER — Ambulatory Visit: Payer: Medicare Other | Admitting: Internal Medicine

## 2020-08-22 VITALS — BP 120/78 | HR 81 | Temp 98.4°F | Ht 72.0 in | Wt 214.2 lb

## 2020-08-22 DIAGNOSIS — J449 Chronic obstructive pulmonary disease, unspecified: Secondary | ICD-10-CM

## 2020-08-22 DIAGNOSIS — J849 Interstitial pulmonary disease, unspecified: Secondary | ICD-10-CM | POA: Diagnosis not present

## 2020-08-22 MED ORDER — PREDNISONE 20 MG PO TABS: 40.0000 mg | ORAL_TABLET | Freq: Every day | ORAL | 1 refills | Status: DC

## 2020-08-22 MED ORDER — AZITHROMYCIN 250 MG PO TABS: ORAL_TABLET | ORAL | 0 refills | Status: DC

## 2020-08-22 NOTE — Progress Notes (Signed)
Name: Ronald Ellis MRN: [id number] DOB: Mar 17, 1960     CONSULTATION DATE: 08/22/2020  REFERRING MD : Ancil Boozer   STUDIES:   12/21/2018 CT chest Independently reviewed by Me Bilateral upper lobe predominant emphysematous changes along with mild interstitial lung disease throughout the lungs Images reviewed with the patient    CHIEF COMPLAINT:  Follow up assessment of COPD  HISTORY OF PRESENT ILLNESS:  + COPD exacerbation Exposed to environmental exposure dust and allergens Increased wheezing increased cough increased shortness of breath Will give patient prednisone and antibiotics at this time  Former 30-pack-year smoker Enrolled in lung cancer screening program  Advised to increase Breo to 2 puffs in a.m. 1 puff in p.m.   PAST MEDICAL HISTORY :   has a past medical history of Arthritis, Chronic pain, COPD (chronic obstructive pulmonary disease) ([name]), Depression (08/06/81), Gluten intolerance, Oxygen deficiency, and Pneumonia.  has a past surgical history that includes Back surgery (2015); Neck surgery (2016); Inguinal hernia repair (Bilateral); lumbar discetomy (2005); Back surgery; Mole removal; and Spine surgery (12/15/01). Prior to Admission medications   Medication Sig Start Date End Date Taking? Authorizing Provider  acetaminophen (TYLENOL) 500 MG tablet Take 500 mg by mouth every 6 (six) hours as needed.   Yes [provider]  albuterol (PROVENTIL HFA;VENTOLIN HFA) 108 (90 Base) MCG/ACT inhaler Inhale 2 puffs into the lungs every 6 (six) hours as needed for wheezing or shortness of breath. 06/29/17  Yes Sowles, Drue Stager, MD  ARIPiprazole (ABILIFY) 5 MG tablet Take 1 tablet (5 mg total) by mouth daily. 03/30/19  Yes Sowles, Drue Stager, MD  Ascorbic Acid (VITAMIN C PO) Take by mouth.   Yes [provider]  b complex vitamins tablet Take 1 tablet by mouth daily.   Yes [provider]  benzonatate (TESSALON) 200 MG capsule Take 1 capsule (200 mg  total) by mouth 3 (three) times daily as needed. 05/30/19  Yes Steele Sizer, MD  BIOTIN PO Take by mouth.   Yes [provider]  DULoxetine (CYMBALTA) 60 MG capsule Take 1 capsule (60 mg total) by mouth daily. 03/30/19  Yes Sowles, Drue Stager, MD  fluticasone (FLONASE) 50 MCG/ACT nasal spray Place 2 sprays into both nostrils daily. 03/30/19  Yes Sowles, Drue Stager, MD  hydrOXYzine (ATARAX/VISTARIL) 10 MG tablet Take 1 tablet (10 mg total) by mouth 3 (three) times daily as needed. 12/28/18  Yes Steele Sizer, MD  MAGNESIUM PO Take by mouth.   Yes [provider]  pregabalin (LYRICA) 100 MG capsule Take 1 capsule (100 mg total) by mouth 3 (three) times daily. 05/30/19  Yes Sowles, Drue Stager, MD  rizatriptan (MAXALT) 10 MG tablet Take 1 tablet (10 mg total) by mouth as needed for migraine. May repeat in 2 hours if needed 04/12/19  Yes Sowles, Drue Stager, MD  terbinafine (LAMISIL) 250 MG tablet Take 1 tablet (250 mg total) by mouth daily. 03/09/19  Yes Hyatt, Max T, DPM  tiotropium (SPIRIVA HANDIHALER) 18 MCG inhalation capsule Place 1 capsule (18 mcg total) into inhaler and inhale daily. 03/30/19  Yes Sowles, Drue Stager, MD  tiZANidine (ZANAFLEX) 4 MG tablet Take 1 tablet (4 mg total) by mouth at bedtime. 06/29/17  Yes Sowles, Drue Stager, MD  traZODone (DESYREL) 100 MG tablet Take 1 tablet (100 mg total) by mouth at bedtime. Pt taking 2 tablets qhs 04/27/19  Yes Sowles, Drue Stager, MD  fluticasone furoate-vilanterol (BREO ELLIPTA) 100-25 MCG/INH AEPB Inhale 1 puff into the lungs daily. Patient not taking: Reported on 06/21/2019 03/30/19   Steele Sizer,  MD   Allergies  Allergen Reactions   Gluten Meal     FAMILY HISTORY:  family history includes Hypertension in his brother and father; Lung cancer in his maternal grandfather; Lung disease in his father. SOCIAL HISTORY:  reports that he quit smoking about 8 years ago. His smoking use included cigarettes. He started smoking about 44 years ago. He has a  42.00 pack-year smoking history. He has never used smokeless tobacco. He reports previous alcohol use. He reports previous drug use. Drugs: "Crack" cocaine, Cocaine, Heroin, Marijuana, and Other-see comments.    Review of Systems:  Gen:  Denies  fever, sweats, chills weight loss  HEENT: Denies blurred vision, double vision, ear pain, eye pain, hearing loss, nose bleeds, sore throat Cardiac:  No dizziness, chest pain or heaviness, chest tightness,edema, No JVD Resp:   +cough, +sputum production, +shortness of breath,+wheezing, -hemoptysis,  Other:  All other systems negative   BP 120/78 (BP Location: Left Arm, Patient Position: Sitting, Cuff Size: Normal)   Pulse 81   Temp 98.4 F (36.9 C) (Oral)   Ht 6' (1.829 m)   Wt 214 lb 3.2 oz (97.2 kg)   SpO2 96%   BMI 29.05 kg/m     Physical Examination:   General Appearance: No distress  Neuro:without focal findings,  speech normal,  HEENT: PERRLA, EOM intact.   Pulmonary: normal breath sounds, + wheezing.  CardiovascularNormal S1,S2.  No m/r/g.    ALL OTHER ROS ARE NEGATIVE        ASSESSMENT AND PLAN SYNOPSIS  60 year old pleasant white male seen today for assessment for COPD Previous CT scan finding shows bilateral cystic and emphysematous changes along with honeycomb pattern in the mild form along the peripheral edges of his lung suggestive of underlying interstitial lung disease as well  Patient now has 7 symptoms of COPD exacerbation most likely due to environmental exposure but infection cannot be ruled out therefore I will prescribe him prednisone 40 mg daily for 7 days and Z-Pak  I have also advised to increase his Breo to 2 puffs in the morning and 1 puff at night Advised to use albuterol as needed Advised to avoid allergens   Chronic hypoxic respiratory failure Continue oxygen as prescribed Patient needs this for survival     MEDICATION ADJUSTMENTS/LABS AND TESTS ORDERED: Continue inhalers as  prescribed  AVOID SECOND HAND SMOKE  FOLLOW UP CT CHEST FOR LUNG CANCER SCREENING EVERY YEAR  Prednisone 40 mg daily for 7 days Z-Pak Albuterol as needed Breo 2 puffs in a.m. 1 puff in p.m.   CURRENT MEDICATIONS REVIEWED AT LENGTH WITH PATIENT TODAY   Patient satisfied with Plan of action and management. All questions answered  Follow up in 6 months  TOTAL TIME SPENT WITH PATIENT 21 mins  Lamar Meter Patricia Pesa, M.D.  [facility]  Medical Director [place] Director [facility] Cardio-Pulmonary Department

## 2020-08-22 NOTE — Patient Instructions (Signed)
Start 40 mg prednisone daily for 7 days Start Z-Pak Continue inhaler therapy  avoid allergens

## 2020-08-29 DIAGNOSIS — M5416 Radiculopathy, lumbar region: Secondary | ICD-10-CM | POA: Diagnosis not present

## 2020-09-12 DIAGNOSIS — J449 Chronic obstructive pulmonary disease, unspecified: Secondary | ICD-10-CM | POA: Diagnosis not present

## 2020-09-21 DIAGNOSIS — M5416 Radiculopathy, lumbar region: Secondary | ICD-10-CM | POA: Diagnosis not present

## 2020-10-13 DIAGNOSIS — J449 Chronic obstructive pulmonary disease, unspecified: Secondary | ICD-10-CM | POA: Diagnosis not present

## 2020-11-01 NOTE — Progress Notes (Signed)
Name: Ronald Ellis   MRN: [id number]    DOB: [birth year]   Date:11/02/2020       Progress Note  Subjective  Chief Complaint  Follow Up  HPI  Intermittent chest pain: seen by Dr. Saunders Revel, He says that has not had any episodes of chest pain recently.  He says as long as he doesn't smoke any cigars.    Stress Myoview done 08/05/2019 :     There was no ST segment deviation noted during stress.   No T wave inversion was noted during stress.   The study is normal.   This is a low risk study.   The left ventricular ejection fraction is normal (55-65%).   Suboptimal study due to GI.    Echocardiogram done 08/04/2019  1. Left ventricular ejection fraction, by estimation, is 65 to 70%. The left ventricle has normal function. The left ventricle has no regional wall motion abnormalities. Left ventricular diastolic parameters were normal. 2. Right ventricular systolic function is normal. The right ventricular size is normal. There is normal pulmonary artery systolic pressure. 3. The mitral valve is normal in structure. No evidence of mitral valve regurgitation. No evidence of mitral stenosis. 4. The aortic valve is normal in structure. Aortic valve regurgitation is not visualized. No aortic stenosis is present. 5. The inferior vena cava is normal in size with greater than 50% respiratory variability, suggesting right atrial pressure of 3 mmHg    Emphysema: he smoked cigarettes for about 40 years one pack daily but quit in 2013 , he quit smoking cigars in 2020.Marland Kitchen He was seen by Dr. Mortimer Fries two weeks ago. He is still using 2 liters of nocturnal oxygen.  He says some days are better than others.  Some days he noticed that if he walks long distances he can get short of breath.  He is hoping to apply for a handicap placard at his next visit.  He continues to use his inhalers as prescribed. He is currently on Breo, Spiriva and albuterol.  He says right now he is only using the [place] and Spiriva daily.    Last CT lung done 12/16/2019  IMPRESSION:  1. Lung-RADS 2, benign appearance or behavior. Continue annual  screening with low-dose chest CT without contrast in 12 months.  2. Similar appearance of interstitial lung disease, with  differential considerations of nonspecific interstitial pneumonitis  and mild usual interstitial pneumonitis.  3. Aortic Atherosclerosis (ICD10-I70.0) and Emphysema (ICD10-J43.9).   Melanoma in site: seeing Dr. Phillip Heal, removed and had Mohl't surgery on his neck .He also had some other pre-cancerous lesions that were removed on left arm and back He is up to date with full body scan . He is on yearly follow ups now.  Stable   Headache: he continues to have a dull headache once or twice a week - he takes tylenol when needed.  He says he has not had a migraine in a while.   Chronic neck and back pain: he has an episode of radiculitis in March with pain radiating down both legs, doing well on Lyrica, pain is more dull and constant, feels stiff, worse at the end of the day, aggravated with shifting position. He is seeing Ortho and has had two steroid injections.    MDD/seasonal affective disorder: He says mood is doing well.  Was able to go visit his children in [facility] this summer.  PQ9 negative.  History of drug use: when he was an young adult, he got married  at age 55 , had 2 kids, when he got a divorce at age 53 , he went wild , he did drugs until age 31, he states the reason he quit was because he did a big snort of cocaine, he fell forward and hit his head , he had a laceration and decided not to do it again. No change  Atherosclerosis of aorta: he tried atorvastatin but caused body aches, also failed Crestor, failed Zetia.  He says he is working on eating healthy.     Patient Active Problem List   Diagnosis Date Noted   Nocturnal hypoxia 06/25/2020   Migraine without aura and without status migrainosus, not intractable 05/02/2020   Seasonal affective  disorder ([place]) 05/02/2020   History of malignant melanoma of skin 08/03/2019   Chest pain of uncertain etiology 17/40/8144   Shortness of breath 06/24/2019   Atherosclerosis of aorta ([place]) 12/17/2018   Personal history of tobacco use, presenting hazards to health 12/16/2018   History of drug dependence/abuse ([place]) 11/26/2018   Celiac disease 02/05/2018   COPD with acute exacerbation ([place]) 06/29/2017   Right-sided low back pain with right-sided sciatica 12/05/2015   Plantar fasciitis of right foot 10/15/2015   Chronic neck and back pain 07/19/2015   Fatigue 07/19/2015    Past Surgical History:  Procedure Laterality Date   BACK SURGERY  2015   BACK SURGERY     INGUINAL HERNIA REPAIR Bilateral    multiple times   lumbar discetomy  2005   MOLE REMOVAL     NECK SURGERY  2016   SPINE SURGERY  12/15/01    Family History  Problem Relation Age of Onset   Hypertension Father    Lung disease Father    Hypertension Brother    Lung cancer Maternal Grandfather     Social History   Tobacco Use   Smoking status: Former    Packs/day: 1.00    Years: 42.00    Pack years: 42.00    Types: Cigarettes    Start date: 1978    Quit date: 12/13/2011    Years since quitting: 8.8   Smokeless tobacco: Never  Substance Use Topics   Alcohol use: Not Currently    Comment: quit 5-6 years ago, only beer - but glutten free diet now     Current Outpatient Medications:    Ascorbic Acid (VITAMIN C PO), Take by mouth., Disp: , Rfl:    b complex vitamins tablet, Take 1 tablet by mouth daily., Disp: , Rfl:    BIOTIN PO, Take by mouth., Disp: , Rfl:    MAGNESIUM PO, Take by mouth., Disp: , Rfl:    pregabalin (LYRICA) 100 MG capsule, Take 1 capsule (100 mg total) by mouth 2 (two) times daily., Disp: 180 capsule, Rfl: 1   rizatriptan (MAXALT) 10 MG tablet, Take 1 tablet (10 mg total) by mouth as needed for migraine. May repeat in 2 hours if needed, Disp: 10 tablet, Rfl: 1   tiZANidine (ZANAFLEX) 4 MG  tablet, Take 1 tablet (4 mg total) by mouth at bedtime., Disp: 90 tablet, Rfl: 1   traZODone (DESYREL) 100 MG tablet, Take 1 tablet (100 mg total) by mouth at bedtime. Pt taking 2 tablets qhs, Disp: 90 tablet, Rfl: 0   zinc gluconate 50 MG tablet, Take 50 mg by mouth daily., Disp: , Rfl:    albuterol (VENTOLIN HFA) 108 (90 Base) MCG/ACT inhaler, Inhale 2 puffs into the lungs every 6 (six) hours as needed for wheezing  or shortness of breath., Disp: 18 g, Rfl: 0   ARIPiprazole (ABILIFY) 5 MG tablet, Take 1 tablet (5 mg total) by mouth daily., Disp: 90 tablet, Rfl: 1   DULoxetine (CYMBALTA) 60 MG capsule, Take 1 capsule (60 mg total) by mouth daily., Disp: 90 capsule, Rfl: 1   fluticasone furoate-vilanterol (BREO ELLIPTA) 100-25 MCG/INH AEPB, Inhale 1 puff into the lungs daily. (Patient not taking: Reported on 11/02/2020), Disp: 180 each, Rfl: 1   hydrOXYzine (ATARAX/VISTARIL) 10 MG tablet, Take 1 tablet (10 mg total) by mouth 3 (three) times daily as needed., Disp: 90 tablet, Rfl: 1   tiotropium (SPIRIVA HANDIHALER) 18 MCG inhalation capsule, Place 1 capsule (18 mcg total) into inhaler and inhale daily., Disp: 90 capsule, Rfl: 1  Allergies  Allergen Reactions   Gluten Meal     I personally reviewed active problem list, medication list, allergies, family history, social history, health maintenance with the patient/caregiver today.   ROS  Constitutional: Negative for fever or weight change.  Respiratory: Positive for cough and shortness of breath.   Cardiovascular: Negative for chest pain or palpitations.  Gastrointestinal: Negative for abdominal pain, no bowel changes.  Musculoskeletal: Negative for gait problem or joint swelling.  Positive for back and neck pain Skin: Negative for rash.  Neurological: Negative for dizziness, negative headache.  No other specific complaints in a complete review of systems (except as listed in HPI above).   Objective  Vitals:   11/02/20 1003  BP: 132/78   Pulse: 71  Resp: 16  Temp: 98 F (36.7 C)  SpO2: 98%  Weight: 216 lb (98 kg)  Height: 6' (1.829 m)    Body mass index is 29.29 kg/m.  Physical Exam Constitutional: Patient appears well-developed and well-nourished. No distress.  HEENT: head atraumatic, normocephalic, pupils equal and reactive to light, neck supple Cardiovascular: Normal rate, regular rhythm and normal heart sounds.  No murmur heard. No BLE edema. Pulmonary/Chest: Effort normal and breath sounds normal. No respiratory distress. Abdominal: Soft.  There is no tenderness. Psychiatric: Patient has a normal mood and affect. behavior is normal. Judgment and thought content normal.      PHQ2/9: Depression screen [facility] 2/9 11/02/2020 11/02/2020 05/02/2020 04/24/2020 02/15/2020  Decreased Interest 1 1 1 2 1   Down, Depressed, Hopeless 0 0 0 1 1  PHQ - 2 Score 1 1 1 3 2   Altered sleeping - 0 0 2 3  Tired, decreased energy - 0 1 2 0  Change in appetite - 0 0 2 0  Feeling bad or failure about yourself  - 0 0 0 0  Trouble concentrating - 0 0 0 0  Moving slowly or fidgety/restless - 0 0 0 0  Suicidal thoughts - 0 0 0 0  PHQ-9 Score - 1 2 9 5   Difficult doing work/chores - Not difficult at all - Somewhat difficult Not difficult at all  Some recent data might be hidden    phq 9 is negative   Fall Risk: Fall Risk  11/02/2020 05/02/2020 04/24/2020 02/15/2020 02/02/2020  Falls in the past year? 0 0 0 0 0  Number falls in past yr: 0 0 0 0 0  Injury with Fall? 0 0 0 0 0  Risk for fall due to : No Fall Risks - - - No Fall Risks  Follow up Falls prevention discussed - - - Falls prevention discussed     Functional Status Survey: Is the patient deaf or have difficulty hearing?: No Does the patient have  difficulty seeing, even when wearing glasses/contacts?: No Does the patient have difficulty concentrating, remembering, or making decisions?: No Does the patient have difficulty walking or climbing stairs?: Yes Does the patient have  difficulty dressing or bathing?: No Does the patient have difficulty doing errands alone such as visiting a doctor's office or shopping?: No    Assessment & Plan  1. Intermittent chest pain   2. Centrilobular emphysema (HCC)  - albuterol (VENTOLIN HFA) 108 (90 Base) MCG/ACT inhaler; Inhale 2 puffs into the lungs every 6 (six) hours as needed for wheezing or shortness of breath.  Dispense: 18 g; Refill: 0  - tiotropium (SPIRIVA HANDIHALER) 18 MCG inhalation capsule; Place 1 capsule (18 mcg total) into inhaler and inhale daily.  Dispense: 90 capsule; Refill: 1  3. History of malignant melanoma of skin   4. Atherosclerosis of aorta ([place])   5. Chronic neck and back pain  - DULoxetine (CYMBALTA) 60 MG capsule; Take 1 capsule (60 mg total) by mouth daily.  Dispense: 90 capsule; Refill: 1  6. Moderate major depression (HCC)  - hydrOXYzine (ATARAX/VISTARIL) 10 MG tablet; Take 1 tablet (10 mg total) by mouth 3 (three) times daily as needed.  Dispense: 90 tablet; Refill: 1 - ARIPiprazole (ABILIFY) 5 MG tablet; Take 1 tablet (5 mg total) by mouth daily.  Dispense: 90 tablet; Refill: 1  7. Outbursts of anger  - hydrOXYzine (ATARAX/VISTARIL) 10 MG tablet; Take 1 tablet (10 mg total) by mouth 3 (three) times daily as needed.  Dispense: 90 tablet; Refill: 1  8. Seasonal affective disorder (HCC)  - ARIPiprazole (ABILIFY) 5 MG tablet; Take 1 tablet (5 mg total) by mouth daily.  Dispense: 90 tablet; Refill: 1  9. Need for immunization against influenza  - Flu Vaccine QUAD 37moIM (Fluarix, Fluzone & Alfiuria Quad PF)

## 2020-11-02 ENCOUNTER — Other Ambulatory Visit: Payer: Self-pay

## 2020-11-02 ENCOUNTER — Encounter: Payer: Self-pay | Admitting: Nurse Practitioner

## 2020-11-02 ENCOUNTER — Ambulatory Visit (INDEPENDENT_AMBULATORY_CARE_PROVIDER_SITE_OTHER): Payer: Medicare Other | Admitting: Nurse Practitioner

## 2020-11-02 VITALS — BP 132/78 | HR 71 | Temp 98.0°F | Resp 16 | Ht 72.0 in | Wt 216.0 lb

## 2020-11-02 DIAGNOSIS — M542 Cervicalgia: Secondary | ICD-10-CM | POA: Diagnosis not present

## 2020-11-02 DIAGNOSIS — R079 Chest pain, unspecified: Secondary | ICD-10-CM

## 2020-11-02 DIAGNOSIS — J438 Other emphysema: Secondary | ICD-10-CM

## 2020-11-02 DIAGNOSIS — M549 Dorsalgia, unspecified: Secondary | ICD-10-CM | POA: Diagnosis not present

## 2020-11-02 DIAGNOSIS — J432 Centrilobular emphysema: Secondary | ICD-10-CM

## 2020-11-02 DIAGNOSIS — Z85820 Personal history of malignant melanoma of skin: Secondary | ICD-10-CM

## 2020-11-02 DIAGNOSIS — F338 Other recurrent depressive disorders: Secondary | ICD-10-CM

## 2020-11-02 DIAGNOSIS — Z23 Encounter for immunization: Secondary | ICD-10-CM

## 2020-11-02 DIAGNOSIS — I700 Atherosclerosis of aorta: Secondary | ICD-10-CM

## 2020-11-02 DIAGNOSIS — R454 Irritability and anger: Secondary | ICD-10-CM

## 2020-11-02 DIAGNOSIS — G8929 Other chronic pain: Secondary | ICD-10-CM

## 2020-11-02 DIAGNOSIS — F321 Major depressive disorder, single episode, moderate: Secondary | ICD-10-CM

## 2020-11-02 MED ORDER — ARIPIPRAZOLE 5 MG PO TABS: 5.0000 mg | ORAL_TABLET | Freq: Every day | ORAL | 1 refills | Status: DC

## 2020-11-02 MED ORDER — DULOXETINE HCL 60 MG PO CPEP: 60.0000 mg | ORAL_CAPSULE | Freq: Every day | ORAL | 1 refills | Status: DC

## 2020-11-02 MED ORDER — SPIRIVA HANDIHALER 18 MCG IN CAPS: 18.0000 ug | ORAL_CAPSULE | Freq: Every day | RESPIRATORY_TRACT | 1 refills | Status: DC

## 2020-11-02 MED ORDER — ALBUTEROL SULFATE HFA 108 (90 BASE) MCG/ACT IN AERS: 2.0000 | INHALATION_SPRAY | Freq: Four times a day (QID) | RESPIRATORY_TRACT | 0 refills | Status: DC | PRN

## 2020-11-02 MED ORDER — HYDROXYZINE HCL 10 MG PO TABS: 10.0000 mg | ORAL_TABLET | Freq: Three times a day (TID) | ORAL | 1 refills | Status: DC | PRN

## 2020-11-13 DIAGNOSIS — J449 Chronic obstructive pulmonary disease, unspecified: Secondary | ICD-10-CM | POA: Diagnosis not present

## 2020-12-13 DIAGNOSIS — J449 Chronic obstructive pulmonary disease, unspecified: Secondary | ICD-10-CM | POA: Diagnosis not present

## 2020-12-24 DIAGNOSIS — M792 Neuralgia and neuritis, unspecified: Secondary | ICD-10-CM | POA: Diagnosis not present

## 2020-12-24 DIAGNOSIS — M5416 Radiculopathy, lumbar region: Secondary | ICD-10-CM | POA: Diagnosis not present

## 2020-12-25 DIAGNOSIS — Z86018 Personal history of other benign neoplasm: Secondary | ICD-10-CM | POA: Diagnosis not present

## 2020-12-25 DIAGNOSIS — L700 Acne vulgaris: Secondary | ICD-10-CM | POA: Diagnosis not present

## 2020-12-25 DIAGNOSIS — Z85820 Personal history of malignant melanoma of skin: Secondary | ICD-10-CM | POA: Diagnosis not present

## 2020-12-25 DIAGNOSIS — B078 Other viral warts: Secondary | ICD-10-CM | POA: Diagnosis not present

## 2020-12-25 DIAGNOSIS — Z85828 Personal history of other malignant neoplasm of skin: Secondary | ICD-10-CM | POA: Diagnosis not present

## 2020-12-25 DIAGNOSIS — L918 Other hypertrophic disorders of the skin: Secondary | ICD-10-CM | POA: Diagnosis not present

## 2020-12-25 DIAGNOSIS — L578 Other skin changes due to chronic exposure to nonionizing radiation: Secondary | ICD-10-CM | POA: Diagnosis not present

## 2021-01-13 DIAGNOSIS — J449 Chronic obstructive pulmonary disease, unspecified: Secondary | ICD-10-CM | POA: Diagnosis not present

## 2021-01-21 DIAGNOSIS — B078 Other viral warts: Secondary | ICD-10-CM | POA: Diagnosis not present

## 2021-02-01 DIAGNOSIS — M25561 Pain in right knee: Secondary | ICD-10-CM | POA: Diagnosis not present

## 2021-02-01 DIAGNOSIS — M25562 Pain in left knee: Secondary | ICD-10-CM | POA: Diagnosis not present

## 2021-02-04 DIAGNOSIS — M5116 Intervertebral disc disorders with radiculopathy, lumbar region: Secondary | ICD-10-CM | POA: Diagnosis not present

## 2021-02-05 ENCOUNTER — Ambulatory Visit (INDEPENDENT_AMBULATORY_CARE_PROVIDER_SITE_OTHER): Payer: Medicare Other

## 2021-02-05 DIAGNOSIS — Z122 Encounter for screening for malignant neoplasm of respiratory organs: Secondary | ICD-10-CM | POA: Diagnosis not present

## 2021-02-05 DIAGNOSIS — Z0000 Encounter for general adult medical examination without abnormal findings: Secondary | ICD-10-CM

## 2021-02-05 NOTE — Patient Instructions (Signed)
Ronald Ellis , Thank you for taking time to come for your Medicare Wellness Visit. I appreciate your ongoing commitment to your health goals. Please review the following plan we discussed and let me know if I can assist you in the future.   Screening recommendations/referrals: Colonoscopy: Cologuard done 12/06/18. Repeat 12/2021 Recommended yearly ophthalmology/optometry visit for glaucoma screening and checkup Recommended yearly dental visit for hygiene and checkup  Vaccinations: Influenza vaccine: done 11/02/20 Pneumococcal vaccine: done 01/02/20 Tdap vaccine: done 11/26/18 Shingles vaccine: Shingrix discussed. Please contact your pharmacy for coverage information.  Covid-19: done 05/26/19, 06/20/19 & 02/14/20  Advanced directives: Please bring a copy of your health care power of attorney and living will to the office at your convenience.   Conditions/risks identified: Recommend increasing physical activity as tolerated  Next appointment: Follow up in one year for your annual wellness visit   Preventive Care 40-64 Years, Male Preventive care refers to lifestyle choices and visits with your health care provider that can promote health and wellness. What does preventive care include? A yearly physical exam. This is also called an annual well check. Dental exams once or twice a year. Routine eye exams. Ask your health care provider how often you should have your eyes checked. Personal lifestyle choices, including: Daily care of your teeth and gums. Regular physical activity. Eating a healthy diet. Avoiding tobacco and drug use. Limiting alcohol use. Practicing safe sex. Taking low-dose aspirin every day starting at age 23. What happens during an annual well check? The services and screenings done by your health care provider during your annual well check will depend on your age, overall health, lifestyle risk factors, and family history of disease. Counseling  Your health care provider  may ask you questions about your: Alcohol use. Tobacco use. Drug use. Emotional well-being. Home and relationship well-being. Sexual activity. Eating habits. Work and work Statistician. Screening  You may have the following tests or measurements: Height, weight, and BMI. Blood pressure. Lipid and cholesterol levels. These may be checked every 5 years, or more frequently if you are over 59 years old. Skin check. Lung cancer screening. You may have this screening every year starting at age 57 if you have a 30-pack-year history of smoking and currently smoke or have quit within the past 15 years. Fecal occult blood test (FOBT) of the stool. You may have this test every year starting at age 96. Flexible sigmoidoscopy or colonoscopy. You may have a sigmoidoscopy every 5 years or a colonoscopy every 10 years starting at age 60. Prostate cancer screening. Recommendations will vary depending on your family history and other risks. Hepatitis C blood test. Hepatitis B blood test. Sexually transmitted disease (STD) testing. Diabetes screening. This is done by checking your blood sugar (glucose) after you have not eaten for a while (fasting). You may have this done every 1-3 years. Discuss your test results, treatment options, and if necessary, the need for more tests with your health care provider. Vaccines  Your health care provider may recommend certain vaccines, such as: Influenza vaccine. This is recommended every year. Tetanus, diphtheria, and acellular pertussis (Tdap, Td) vaccine. You may need a Td booster every 10 years. Zoster vaccine. You may need this after age 79. Pneumococcal 13-valent conjugate (PCV13) vaccine. You may need this if you have certain conditions and have not been vaccinated. Pneumococcal polysaccharide (PPSV23) vaccine. You may need one or two doses if you smoke cigarettes or if you have certain conditions. Talk to your health care provider  about which screenings and  vaccines you need and how often you need them. This information is not intended to replace advice given to you by your health care provider. Make sure you discuss any questions you have with your health care provider. Document Released: 03/16/2015 Document Revised: 11/07/2015 Document Reviewed: 12/19/2014 Elsevier Interactive Patient Education  2017 [place] Prevention in the Home Falls can cause injuries. They can happen to people of all ages. There are many things you can do to make your home safe and to help prevent falls. What can I do on the outside of my home? Regularly fix the edges of walkways and driveways and fix any cracks. Remove anything that might make you trip as you walk through a door, such as a raised step or threshold. Trim any bushes or trees on the path to your home. Use bright outdoor lighting. Clear any walking paths of anything that might make someone trip, such as rocks or tools. Regularly check to see if handrails are loose or broken. Make sure that both sides of any steps have handrails. Any raised decks and porches should have guardrails on the edges. Have any leaves, snow, or ice cleared regularly. Use sand or salt on walking paths during winter. Clean up any spills in your garage right away. This includes oil or grease spills. What can I do in the bathroom? Use night lights. Install grab bars by the toilet and in the tub and shower. Do not use towel bars as grab bars. Use non-skid mats or decals in the tub or shower. If you need to sit down in the shower, use a plastic, non-slip stool. Keep the floor dry. Clean up any water that spills on the floor as soon as it happens. Remove soap buildup in the tub or shower regularly. Attach bath mats securely with double-sided non-slip rug tape. Do not have throw rugs and other things on the floor that can make you trip. What can I do in the bedroom? Use night lights. Make sure that you have a light by your  bed that is easy to reach. Do not use any sheets or blankets that are too big for your bed. They should not hang down onto the floor. Have a firm chair that has side arms. You can use this for support while you get dressed. Do not have throw rugs and other things on the floor that can make you trip. What can I do in the kitchen? Clean up any spills right away. Avoid walking on wet floors. Keep items that you use a lot in easy-to-reach places. If you need to reach something above you, use a strong step stool that has a grab bar. Keep electrical cords out of the way. Do not use floor polish or wax that makes floors slippery. If you must use wax, use non-skid floor wax. Do not have throw rugs and other things on the floor that can make you trip. What can I do with my stairs? Do not leave any items on the stairs. Make sure that there are handrails on both sides of the stairs and use them. Fix handrails that are broken or loose. Make sure that handrails are as long as the stairways. Check any carpeting to make sure that it is firmly attached to the stairs. Fix any carpet that is loose or worn. Avoid having throw rugs at the top or bottom of the stairs. If you do have throw rugs, attach them to the floor  with carpet tape. Make sure that you have a light switch at the top of the stairs and the bottom of the stairs. If you do not have them, ask someone to add them for you. What else can I do to help prevent falls? Wear shoes that: Do not have high heels. Have rubber bottoms. Are comfortable and fit you well. Are closed at the toe. Do not wear sandals. If you use a stepladder: Make sure that it is fully opened. Do not climb a closed stepladder. Make sure that both sides of the stepladder are locked into place. Ask someone to hold it for you, if possible. Clearly mark and make sure that you can see: Any grab bars or handrails. First and last steps. Where the edge of each step is. Use tools that  help you move around (mobility aids) if they are needed. These include: Canes. Walkers. Scooters. Crutches. Turn on the lights when you go into a dark area. Replace any light bulbs as soon as they burn out. Set up your furniture so you have a clear path. Avoid moving your furniture around. If any of your floors are uneven, fix them. If there are any pets around you, be aware of where they are. Review your medicines with your doctor. Some medicines can make you feel dizzy. This can increase your chance of falling. Ask your doctor what other things that you can do to help prevent falls. This information is not intended to replace advice given to you by your health care provider. Make sure you discuss any questions you have with your health care provider. Document Released: 12/14/2008 Document Revised: 07/26/2015 Document Reviewed: 03/24/2014 Elsevier Interactive Patient Education  2017 [employer].

## 2021-02-05 NOTE — Progress Notes (Signed)
Subjective:   Christion Leonhard is a 60 y.o. male who presents for Medicare Annual/Subsequent preventive examination.  Virtual Visit via Telephone Note  I connected with  Seferino Oscar on 02/05/21 at  2:50 PM EST by telephone and verified that I am speaking with the correct person using two identifiers.  Location: Patient: home Provider: [place] Persons participating in the virtual visit: [place]   I discussed the limitations, risks, security and privacy concerns of performing an evaluation and management service by telephone and the availability of in person appointments. The patient expressed understanding and agreed to proceed.  Interactive audio and video telecommunications were attempted between this nurse and patient, however failed, due to patient having technical difficulties OR patient did not have access to video capability.  We continued and completed visit with audio only.  Some vital signs may be absent or patient reported.   [name], LPN   Review of Systems     Cardiac Risk Factors include: advanced age (>32mn, >>72women);male gender;sedentary lifestyle     Objective:    There were no vitals filed for this visit. There is no height or weight on file to calculate BMI.  Advanced Directives 02/05/2021 02/02/2020 01/14/2019 10/01/2016 02/15/2016 01/16/2016 12/05/2015  Does Patient Have a Medical Advance Directive? Yes No No No No No No  Type of [place]of [place] will - - - - - -  Copy of [place] Chart? No - copy requested - - - - - -  Would patient like information on creating a medical advance directive? - No - Patient declined Yes (MAU/Ambulatory/Procedural Areas - Information given) - - No - patient declined information No - patient declined information    Current Medications (verified) Outpatient Encounter Medications as of 02/05/2021  Medication Sig   albuterol (VENTOLIN HFA) 108 (90 Base)  MCG/ACT inhaler Inhale 2 puffs into the lungs every 6 (six) hours as needed for wheezing or shortness of breath.   ARIPiprazole (ABILIFY) 5 MG tablet Take 1 tablet (5 mg total) by mouth daily.   Ascorbic Acid (VITAMIN C PO) Take by mouth.   b complex vitamins tablet Take 1 tablet by mouth daily.   BIOTIN PO Take by mouth.   DULoxetine (CYMBALTA) 60 MG capsule Take 1 capsule (60 mg total) by mouth daily.   fluticasone furoate-vilanterol (BREO ELLIPTA) 100-25 MCG/INH AEPB Inhale 1 puff into the lungs daily.   hydrOXYzine (ATARAX/VISTARIL) 10 MG tablet Take 1 tablet (10 mg total) by mouth 3 (three) times daily as needed.   L-Citrulline POWD by Does not apply route daily.   MAGNESIUM PO Take by mouth.   naproxen (NAPROSYN) 500 MG tablet Take 500 mg by mouth 2 (two) times daily as needed.   pregabalin (LYRICA) 100 MG capsule Take 1 capsule (100 mg total) by mouth 2 (two) times daily.   rizatriptan (MAXALT) 10 MG tablet Take 1 tablet (10 mg total) by mouth as needed for migraine. May repeat in 2 hours if needed   tiZANidine (ZANAFLEX) 4 MG tablet Take 1 tablet (4 mg total) by mouth at bedtime.   traZODone (DESYREL) 100 MG tablet Take 1 tablet (100 mg total) by mouth at bedtime. Pt taking 2 tablets qhs   TWYNEO 0.1-3 % CREA Apply 1 application topically daily. PRN   zinc gluconate 50 MG tablet Take 50 mg by mouth daily.   [DISCONTINUED] tiotropium (SPIRIVA HANDIHALER) 18 MCG inhalation capsule Place 1 capsule (18 mcg total)  into inhaler and inhale daily.   No facility-administered encounter medications on file as of 02/05/2021.    Allergies (verified) Statins and Gluten meal   History: Past Medical History:  Diagnosis Date   Arthritis    Chronic pain    COPD (chronic obstructive pulmonary disease) (HCC)    Depression 08/06/81   Gluten intolerance    Oxygen deficiency    Pneumonia    Past Surgical History:  Procedure Laterality Date   BACK SURGERY  2015   BACK SURGERY     INGUINAL  HERNIA REPAIR Bilateral    multiple times   lumbar discetomy  2005   MOLE REMOVAL     NECK SURGERY  2016   SPINE SURGERY  12/15/01   Family History  Problem Relation Age of Onset   Hypertension Father    Lung disease Father    Hypertension Brother    Lung cancer Maternal Grandfather    Social History   Socioeconomic History   Marital status: Significant Other    Spouse name: [name]   Number of children: 4   Years of education: Not on file   Highest education level: Some college, no degree  Occupational History   Occupation: disbaled     Comment: Technical brewer pool coverage   Tobacco Use   Smoking status: Former    Packs/day: 1.00    Years: 42.00    Pack years: 42.00    Types: Cigarettes    Start date: 24    Quit date: 12/13/2011    Years since quitting: 9.1   Smokeless tobacco: Never  Vaping Use   Vaping Use: Never used  Substance and Sexual Activity   Alcohol use: Not Currently    Comment: quit 5-6 years ago, only beer - but glutten free diet now   Drug use: Not Currently    Types: "Crack" cocaine, Cocaine, Heroin, Marijuana, Other-see comments    Comment: meths in his 27's-30's   Sexual activity: Yes    Partners: Female  Other Topics Concern   Not on file  Social History Narrative   Approved for disability 06/2017 ( workman's comp back 2014) , secondary to neck and back injury    Social Determinants of Health   Financial Resource Strain: Low Risk    Difficulty of Paying Living Expenses: Not very hard  Food Insecurity: No Food Insecurity   Worried About Charity fundraiser in the Last Year: Never true   Ran Out of Food in the Last Year: Never true  Transportation Needs: No Transportation Needs   Lack of Transportation (Medical): No   Lack of Transportation (Non-Medical): No  Physical Activity: Inactive   Days of Exercise per Week: 0 days   Minutes of Exercise per Session: 0 min  Stress: No Stress Concern Present   Feeling of  Stress : Only a little  Social Connections: Moderately Isolated   Frequency of Communication with Friends and Family: More than three times a week   Frequency of Social Gatherings with Friends and Family: Twice a week   Attends Religious Services: Never   Marine scientist or Organizations: No   Attends Music therapist: Never   Marital Status: Married    Tobacco Counseling Counseling given: Not Answered   Clinical Intake:  Pre-visit preparation completed: Yes  Pain : No/denies pain     Nutritional Risks: None Diabetes: No  How often do you need to have someone help you when you read  instructions, pamphlets, or other written materials from your doctor or pharmacy?: 1 - Never    Interpreter Needed?: No  Information entered by :: [name] LPN   Activities of Daily Living In your present state of health, do you have any difficulty performing the following activities: 02/05/2021 11/02/2020  Hearing? N N  Vision? N N  Difficulty concentrating or making decisions? N N  Walking or climbing stairs? Y Y  Dressing or bathing? N N  Doing errands, shopping? N N  Preparing Food and eating ? N -  Using the Toilet? N -  In the past six months, have you accidently leaked urine? N -  Do you have problems with loss of bowel control? N -  Managing your Medications? N -  Managing your Finances? N -  Housekeeping or managing your Housekeeping? N -  Some recent data might be hidden    Patient Care Team: Steele Sizer, MD as PCP - General (Family Medicine) End, Harrell Gave, MD as PCP - Cardiology (Cardiology) Flora Lipps, MD as Consulting Physician (Pulmonary Disease) Jannet Mantis, MD (Dermatology) Carmelia Roller, MD as Referring Physician (Orthopedic Surgery) Lovell Sheehan, MD as Consulting Physician (Orthopedic Surgery)  Indicate any recent Medical Services you may have received from other than [facility] providers in the past year (date may be  approximate).     Assessment:   This is a routine wellness examination for Goodell.  Hearing/Vision screen Hearing Screening - Comments:: Pt denies hearing difficulty Vision Screening - Comments:: Past due for eye exam. Not established with provider; declines referral  Dietary issues and exercise activities discussed: Current Exercise Habits: The patient does not participate in regular exercise at present, Exercise limited by: orthopedic condition(s)   Goals Addressed   None    Depression Screen PHQ 2/9 Scores 02/05/2021 11/02/2020 11/02/2020 05/02/2020 04/24/2020 02/15/2020 02/02/2020  PHQ - 2 Score 4 1 1 1 3 2  0  PHQ- 9 Score 6 - 1 2 9 5  -    Fall Risk Fall Risk  02/05/2021 11/02/2020 05/02/2020 04/24/2020 02/15/2020  Falls in the past year? 0 0 0 0 0  Number falls in past yr: 0 0 0 0 0  Injury with Fall? 0 0 0 0 0  Risk for fall due to : Orthopedic patient No Fall Risks - - -  Follow up Falls prevention discussed Falls prevention discussed - - -    FALL RISK PREVENTION PERTAINING TO THE HOME:  Any stairs in or around the home? Yes  If so, are there any without handrails? No  Home free of loose throw rugs in walkways, pet beds, electrical cords, etc? Yes  Adequate lighting in your home to reduce risk of falls? Yes   ASSISTIVE DEVICES UTILIZED TO PREVENT FALLS:  Life alert? No  Use of a cane, walker or w/c? Yes  Grab bars in the bathroom? No  Shower chair or bench in shower? No  Elevated toilet seat or a handicapped toilet? No   TIMED UP AND GO:  Was the test performed? No . Telephonic visit.   Cognitive Function: Normal cognitive status assessed by direct observation by this Nurse Health Advisor. No abnormalities found.          Immunizations Immunization History  Administered Date(s) Administered   Influenza,inj,Quad PF,6+ Mos 04/27/2019, 01/02/2020, 11/02/2020   PFIZER(Purple Top)SARS-COV-2 Vaccination 05/26/2019, 06/20/2019, 02/14/2020   Pneumococcal Conjugate-13  01/02/2020   Pneumococcal Polysaccharide-23 06/29/2017   Tdap 11/26/2018    TDAP status: Up to  date  Flu Vaccine status: Up to date  Pneumococcal vaccine status: Up to date  Covid-19 vaccine status: Completed vaccines  Qualifies for Shingles Vaccine? Yes   Zostavax completed No   Shingrix Completed?: No.    Education has been provided regarding the importance of this vaccine. Patient has been advised to call insurance company to determine out of pocket expense if they have not yet received this vaccine. Advised may also receive vaccine at local pharmacy or Health Dept. Verbalized acceptance and understanding.  Screening Tests Health Maintenance  Topic Date Due   Zoster Vaccines- Shingrix (1 of 2) Never done   COVID-19 Vaccine (4 - Booster for Pfizer series) 04/10/2020   Fecal DNA (Cologuard)  12/05/2021   Pneumococcal Vaccine 78-3 Years old (3 - PPSV23 if available, else PCV20) 06/30/2022   TETANUS/TDAP  11/25/2028   INFLUENZA VACCINE  Completed   Hepatitis C Screening  Completed   HIV Screening  Completed   HPV VACCINES  Aged Out    Health Maintenance  Health Maintenance Due  Topic Date Due   Zoster Vaccines- Shingrix (1 of 2) Never done   COVID-19 Vaccine (4 - Booster for Pfizer series) 04/10/2020    Colorectal cancer screening: Type of screening: Cologuard. Completed 12/06/18. Repeat every 3 years  Lung Cancer Screening: (Low Dose CT Chest recommended if Age 58-80 years, 30 pack-year currently smoking OR have quit w/in 15years.) does qualify.   Lung Cancer Screening Referral: A referral has been sent to [facility]monary Lung Cancer Screening regarding the possible need for this exam. The patient's chart will be reviewed to determine if they qualify and the patient will be contacted to facilitate the scheduling of the Low Dose Chest CT for lung cancer screening.    Additional Screening:  Hepatitis C Screening: does qualify; Completed 06/29/17  Vision Screening:  Recommended annual ophthalmology exams for early detection of glaucoma and other disorders of the eye. Is the patient up to date with their annual eye exam?  No  Who is the provider or what is the name of the office in which the patient attends annual eye exams? Not established If pt is not established with a provider, would they like to be referred to a provider to establish care? No .   Dental Screening: Recommended annual dental exams for proper oral hygiene  Community Resource Referral / Chronic Care Management: CRR required this visit?  No   CCM required this visit?  No      Plan:     I have personally reviewed and noted the following in the patient's chart:   Medical and social history Use of alcohol, tobacco or illicit drugs  Current medications and supplements including opioid prescriptions. Patient is not currently taking opioid prescriptions. Functional ability and status Nutritional status Physical activity Advanced directives List of other physicians Hospitalizations, surgeries, and ER visits in previous 12 months Vitals Screenings to include cognitive, depression, and falls Referrals and appointments  In addition, I have reviewed and discussed with patient certain preventive protocols, quality metrics, and best practice recommendations. A written personalized care plan for preventive services as well as general preventive health recommendations were provided to patient.     [name], LPN   [id number]   Nurse Notes: none

## 2021-02-12 ENCOUNTER — Other Ambulatory Visit: Payer: Self-pay | Admitting: Nurse Practitioner

## 2021-02-12 DIAGNOSIS — R454 Irritability and anger: Secondary | ICD-10-CM

## 2021-02-12 DIAGNOSIS — F321 Major depressive disorder, single episode, moderate: Secondary | ICD-10-CM

## 2021-02-12 NOTE — Telephone Encounter (Signed)
Requested Prescriptions  Pending Prescriptions Disp Refills  . hydrOXYzine (ATARAX) 10 MG tablet [Pharmacy Med Name: HYDROXYZINE HCL 10MG TABLETS] 90 tablet 1    Sig: TAKE 1 TABLET(10 MG) BY MOUTH THREE TIMES DAILY AS NEEDED     Ear, Nose, and Throat:  Antihistamines Passed - 02/12/2021  6:20 AM      Passed - Valid encounter within last 12 months    Recent Outpatient Visits          3 months ago Intermittent chest pain   [facility] Bo Merino, FNP   9 months ago Centrilobular emphysema [facility])   [facility] Steele Sizer, MD   9 months ago COPD with acute exacerbation [facility])   [facility] Steele Sizer, MD   12 months ago COPD with acute exacerbation [facility])   [facility] Steele Sizer, MD   1 year ago Centrilobular emphysema [facility])   [facility] Steele Sizer, MD      Future Appointments            In 2 months Ancil Boozer, Drue Stager, MD [facility], [place]   In 11 months  [place]

## 2021-03-11 ENCOUNTER — Ambulatory Visit: Payer: Self-pay | Admitting: *Deleted

## 2021-03-11 NOTE — Telephone Encounter (Signed)
°  Chief Complaint: COPD- worse Symptoms: dry, tight cough Frequency: 1 week Pertinent Negatives: Patient denies COVID, dizziness, fever Disposition: [] ED /[] Urgent Care (no appt availability in office) / [x] Appointment(In office/virtual)/ []  [facility]/ [] Home Care/ [] Refused Recommended Disposition /[]  Mobile Bus/ []  Follow-up with PCP Additional Notes: Patient does not want to go to UC- scheduled for appointment tomorrow- office closed for lunch- call sent for review

## 2021-03-11 NOTE — Telephone Encounter (Signed)
Please check for appointment

## 2021-03-11 NOTE — Telephone Encounter (Signed)
Reason for Disposition  [1] Longstanding difficulty breathing (e.g., CHF, COPD, emphysema) AND [2] WORSE than normal  Answer Assessment - Initial Assessment Questions 1. RESPIRATORY STATUS: "Describe your breathing?" (e.g., wheezing, shortness of breath, unable to speak, severe coughing)      SOB with exertion, chest tightness- dry heavy cough 2. ONSET: "When did this breathing problem begin?"      1 week 3. PATTERN "Does the difficult breathing come and go, or has it been constant since it started?"      Comes and goes- depends on activity 4. SEVERITY: "How bad is your breathing?" (e.g., mild, moderate, severe)    - MILD: No SOB at rest, mild SOB with walking, speaks normally in sentences, can lie down, no retractions, pulse < 100.    - MODERATE: SOB at rest, SOB with minimal exertion and prefers to sit, cannot lie down flat, speaks in phrases, mild retractions, audible wheezing, pulse 100-120.    - SEVERE: Very SOB at rest, speaks in single words, struggling to breathe, sitting hunched forward, retractions, pulse > 120      Mild- using inhalers  5. RECURRENT SYMPTOM: "Have you had difficulty breathing before?" If Yes, ask: "When was the last time?" and "What happened that time?"      COPD 6. CARDIAC HISTORY: "Do you have any history of heart disease?" (e.g., heart attack, angina, bypass surgery, angioplasty)      no 7. LUNG HISTORY: "Do you have any history of lung disease?"  (e.g., pulmonary embolus, asthma, emphysema)     COPD 8. CAUSE: "What do you think is causing the breathing problem?"      COPD flare 9. OTHER SYMPTOMS: "Do you have any other symptoms? (e.g., dizziness, runny nose, cough, chest pain, fever)     Cough, chest tightness, headache 10. O2 SATURATION MONITOR:  "Do you use an oxygen saturation monitor (pulse oximeter) at home?" If Yes, "What is your reading (oxygen level) today?" "What is your usual oxygen saturation reading?" (e.g., 95%)       Yes, 93-95%- normally-  today- 95%, 95 bpm 11. PREGNANCY: "Is there any chance you are pregnant?" "When was your last menstrual period?"       *No Answer* 12. TRAVEL: "Have you traveled out of the country in the last month?" (e.g., travel history, exposures)       *No Answer*  Protocols used: Breathing Difficulty-A-AH

## 2021-03-11 NOTE — Telephone Encounter (Signed)
Appt sch'd with Malachy Mood for 1.10.2023

## 2021-03-11 NOTE — Telephone Encounter (Signed)
DO this patient have an appointment on file

## 2021-03-12 ENCOUNTER — Ambulatory Visit (INDEPENDENT_AMBULATORY_CARE_PROVIDER_SITE_OTHER): Payer: Medicare Other | Admitting: Unknown Physician Specialty

## 2021-03-12 ENCOUNTER — Encounter: Payer: Self-pay | Admitting: Unknown Physician Specialty

## 2021-03-12 VITALS — BP 138/80 | HR 90 | Temp 98.2°F | Resp 16 | Ht 72.0 in | Wt 220.6 lb

## 2021-03-12 DIAGNOSIS — J441 Chronic obstructive pulmonary disease with (acute) exacerbation: Secondary | ICD-10-CM

## 2021-03-12 MED ORDER — DOXYCYCLINE HYCLATE 100 MG PO TABS: 100.0000 mg | ORAL_TABLET | Freq: Two times a day (BID) | ORAL | 0 refills | Status: DC

## 2021-03-12 MED ORDER — PREDNISONE 20 MG PO TABS: 40.0000 mg | ORAL_TABLET | Freq: Every day | ORAL | 0 refills | Status: DC

## 2021-03-12 NOTE — Progress Notes (Signed)
BP 138/80    Pulse 90    Temp 98.2 F (36.8 C) (Oral)    Resp 16    Ht 6' (1.829 m)    Wt 220 lb 9.6 oz (100.1 kg)    SpO2 99%    BMI 29.92 kg/m    Subjective:    Patient ID: Ronald Ellis, male    DOB: [birth year], 61 y.o.   MRN: [id number]  HPI: Ronald Ellis is a 61 y.o. male  Chief Complaint  Patient presents with   Cough    Pt states has been having this cough for over a week and believes its related to his COPD.   Pt with cough x 1 week.  It is a dry cough.  Covid test was negative.  No fever, slight nasal congestion, muscle aches.  Daily headaches which are normal for him.  Takes Breo plus Albuterol twice a day.  SOB when up doing stuff which is new.  No ankle swelling.  Normally maintenance meds working well.  Getting yealy CT scans  Relevant past medical, surgical, family and social history reviewed and updated as indicated. Interim medical history since our last visit reviewed. Allergies and medications reviewed and updated.  Review of Systems  Constitutional:  Positive for fatigue. Negative for chills, fever and unexpected weight change.  Respiratory: Negative.    Cardiovascular: Negative.   Neurological: Negative.   Psychiatric/Behavioral: Negative.     Per HPI unless specifically indicated above     Objective:    BP 138/80    Pulse 90    Temp 98.2 F (36.8 C) (Oral)    Resp 16    Ht 6' (1.829 m)    Wt 220 lb 9.6 oz (100.1 kg)    SpO2 99%    BMI 29.92 kg/m   Wt Readings from Last 3 Encounters:  03/12/21 220 lb 9.6 oz (100.1 kg)  11/02/20 216 lb (98 kg)  08/22/20 214 lb 3.2 oz (97.2 kg)    Physical Exam Constitutional:      General: He is not in acute distress.    Appearance: Normal appearance. He is well-developed.  HENT:     Head: Normocephalic and atraumatic.  Eyes:     General: Lids are normal. No scleral icterus.       Right eye: No discharge.        Left eye: No discharge.     Conjunctiva/sclera: Conjunctivae normal.  Neck:     Vascular: No carotid  bruit or JVD.  Cardiovascular:     Rate and Rhythm: Normal rate and regular rhythm.     Heart sounds: Normal heart sounds.  Pulmonary:     Effort: Pulmonary effort is normal. No respiratory distress.     Breath sounds: Examination of the right-lower field reveals decreased breath sounds and rales. Examination of the left-lower field reveals decreased breath sounds and rales. Decreased breath sounds and rales present.  Abdominal:     Palpations: There is no hepatomegaly or splenomegaly.  Musculoskeletal:        General: Normal range of motion.     Cervical back: Normal range of motion and neck supple.  Skin:    General: Skin is warm and dry.     Coloration: Skin is not pale.     Findings: No rash.  Neurological:     Mental Status: He is alert and oriented to person, place, and time.  Psychiatric:        Behavior: Behavior normal.  Thought Content: Thought content normal.        Judgment: Judgment normal.    Results for orders placed or performed in visit on 04/24/20  Novel Coronavirus, NAA ([facility])   Specimen: Nasopharyngeal(NP) swabs in vial transport medium   Nasopharynge  Result Value Ref Range   SARS-CoV-2, NAA Not Detected Not Detected  SARS-COV-2, NAA 2 DAY TAT   Nasopharynge  Result Value Ref Range   SARS-CoV-2, NAA 2 DAY TAT Performed   Brain natriuretic peptide  Result Value Ref Range   Brain Natriuretic Peptide 5 <100 pg/mL  CBC with Differential/Platelet  Result Value Ref Range   WBC 8.6 3.8 - 10.8 Thousand/uL   RBC 5.27 4.20 - 5.80 Million/uL   Hemoglobin 17.7 (H) 13.2 - 17.1 g/dL   HCT 51.0 (H) 38.5 - 50.0 %   MCV 96.8 80.0 - 100.0 fL   MCH 33.6 (H) 27.0 - 33.0 pg   MCHC 34.7 32.0 - 36.0 g/dL   RDW 12.6 11.0 - 15.0 %   Platelets 311 140 - 400 Thousand/uL   MPV 10.0 7.5 - 12.5 fL   Neutro Abs 4,360 1,500 - 7,800 cells/uL   Lymphs Abs 2,786 850 - 3,900 cells/uL   Absolute Monocytes 972 (H) 200 - 950 cells/uL   Eosinophils Absolute 404 15 - 500  cells/uL   Basophils Absolute 77 0 - 200 cells/uL   Neutrophils Relative % 50.7 %   Total Lymphocyte 32.4 %   Monocytes Relative 11.3 %   Eosinophils Relative 4.7 %   Basophils Relative 0.9 %  Comprehensive metabolic panel  Result Value Ref Range   Glucose, Bld 70 65 - 99 mg/dL   BUN 9 7 - 25 mg/dL   Creat 0.96 0.70 - 1.33 mg/dL   BUN/Creatinine Ratio NOT APPLICABLE 6 - 22 (calc)   Sodium 138 135 - 146 mmol/L   Potassium 4.4 3.5 - 5.3 mmol/L   Chloride 105 98 - 110 mmol/L   CO2 25 20 - 32 mmol/L   Calcium 10.0 8.6 - 10.3 mg/dL   Total Protein 7.2 6.1 - 8.1 g/dL   Albumin 4.5 3.6 - 5.1 g/dL   Globulin 2.7 1.9 - 3.7 g/dL (calc)   AG Ratio 1.7 1.0 - 2.5 (calc)   Total Bilirubin 0.8 0.2 - 1.2 mg/dL   Alkaline phosphatase (APISO) 97 35 - 144 U/L   AST 18 10 - 35 U/L   ALT 26 9 - 46 U/L      Assessment & Plan:   COPD with acute exacerbation:  Will rx Doxycycline 100 mg BID for 7 days.  Steroid burst of 40 mg daily for 5 days. Increase Albuterol use.  Follow up plan: F/u prn

## 2021-04-10 ENCOUNTER — Other Ambulatory Visit: Payer: Self-pay

## 2021-04-10 DIAGNOSIS — Z87891 Personal history of nicotine dependence: Secondary | ICD-10-CM

## 2021-04-26 ENCOUNTER — Other Ambulatory Visit: Payer: Self-pay

## 2021-04-26 ENCOUNTER — Ambulatory Visit
Admission: RE | Admit: 2021-04-26 | Discharge: 2021-04-26 | Disposition: A | Discharge status: Home / Self Care | Payer: Medicare Other | Source: Ambulatory Visit | Attending: Acute Care | Admitting: Acute Care

## 2021-04-26 DIAGNOSIS — Z87891 Personal history of nicotine dependence: Secondary | ICD-10-CM | POA: Insufficient documentation

## 2021-05-01 ENCOUNTER — Other Ambulatory Visit: Payer: Self-pay

## 2021-05-01 DIAGNOSIS — Z87891 Personal history of nicotine dependence: Secondary | ICD-10-CM

## 2021-05-02 NOTE — Progress Notes (Signed)
Name: Ronald Ellis   MRN: [id number]    DOB: [birth year]   Date:05/03/2021       Progress Note  Subjective  Chief Complaint  Follow Up  HPI  Intermittent chest pain: seen by Dr. Saunders Revel,, seems to be related to episodes of bronchitis - feels tight, he has sob but likely from emphysema he stopped taking Atorvastatin because it caused muscle aches He states doing better later   Stress Myoview done 08/05/2019 :     There was no ST segment deviation noted during stress.   No T wave inversion was noted during stress.   The study is normal.   This is a low risk study.   The left ventricular ejection fraction is normal (55-65%).   Suboptimal study due to GI.    Echocardiogram done 08/04/2019  1. Left ventricular ejection fraction, by estimation, is 65 to 70%. The left ventricle has normal function. The left ventricle has no regional wall motion abnormalities. Left ventricular diastolic parameters were normal. 2. Right ventricular systolic function is normal. The right ventricular size is normal. There is normal pulmonary artery systolic pressure. 3. The mitral valve is normal in structure. No evidence of mitral valve regurgitation. No evidence of mitral stenosis. 4. The aortic valve is normal in structure. Aortic valve regurgitation is not visualized. No aortic stenosis is present. 5. The inferior vena cava is normal in size with greater than 50% respiratory variability, suggesting right atrial pressure of 3 mmHg    Emphysema: he smoked cigarettes for about 40 years one pack daily but quit in 2013 , he quit smoking cigars in 2020.Marland Kitchen He was seen by Dr. Mortimer Fries. He is now on prn  2 liters of nocturnal oxygen. His CT chest was done recently and will repeat it yearly He still on Breo daily, he states no longer having cough or wheezing some sob with moderate activity   Melanoma in site 2021 : seeing Dr. Phillip Heal, removed and had Mohl't surgery on his neck . He is up to date with follow ups    Headache: improved, however still takes Advil in am's for body aches..  He states migraine headaches very seldom now, it was associated with nausea, throbbing pain, behind right eye and frontal area, he states pain causes vomiting at times, he states maxalt decreases the intensity down to a minimum and he is able to function.He still has medication at home    Chronic neck and back pain: he has an episode of radiculitis in March with pain radiating down both legs, radiculitis improved with Lyrica but he stopped taking because he ran out of medication. He is still seeing Ortho, had some steroid injections last year and improved symptoms but still has pain on right leg. We will resume Lyrica since also having the urge to move his legs in the evening and worse at night time.    MDD/seasonal affective disorder: he is compliant with medication, not physically active during winter months, but he has been cutting on portion size and lost some weight, trying to be outdoors with his grandson to help with his mood. Phq 9 is 2 today, looking forward to sunny days He states anger is under control, medications are working well for him   History of drug use: when he was an young adult, he got married at age 30 , had 2 kids, when he got a divorce at age 57 , he went wild , he did drugs until age 48, he states the reason  he quit was because he did a big snort of cocaine, he fell forward and hit his head , he had a laceration and decided not to do it again. Still in remission   Atherosclerosis of aorta: he tried atorvastatin but caused body aches, also failed Crestor, we gave him Zetia  but he never took it   Patient Active Problem List   Diagnosis Date Noted   Nocturnal hypoxia 06/25/2020   Migraine without aura and without status migrainosus, not intractable 05/02/2020   Seasonal affective disorder ([place]) 05/02/2020   History of malignant melanoma of skin 08/03/2019   Chest pain of uncertain etiology 52/77/8242    Atherosclerosis of aorta ([place]) 12/17/2018   Personal history of tobacco use, presenting hazards to health 12/16/2018   History of drug dependence/abuse ([place]) 11/26/2018   Celiac disease 02/05/2018   Right-sided low back pain with right-sided sciatica 12/05/2015   Plantar fasciitis of right foot 10/15/2015   Chronic neck and back pain 07/19/2015    Past Surgical History:  Procedure Laterality Date   BACK SURGERY  2015   BACK SURGERY     INGUINAL HERNIA REPAIR Bilateral    multiple times   lumbar discetomy  2005   MOLE REMOVAL     NECK SURGERY  2016   SPINE SURGERY  12/15/01    Family History  Problem Relation Age of Onset   Hypertension Father    Lung disease Father    Hypertension Brother    Lung cancer Maternal Grandfather     Social History   Tobacco Use   Smoking status: Former    Packs/day: 1.00    Years: 42.00    Pack years: 42.00    Types: Cigarettes    Start date: 1978    Quit date: 12/13/2011    Years since quitting: 9.3   Smokeless tobacco: Never  Substance Use Topics   Alcohol use: Not Currently    Comment: quit 5-6 years ago, only beer - but glutten free diet now     Current Outpatient Medications:    albuterol (VENTOLIN HFA) 108 (90 Base) MCG/ACT inhaler, Inhale 2 puffs into the lungs every 6 (six) hours as needed for wheezing or shortness of breath., Disp: 18 g, Rfl: 0   ARIPiprazole (ABILIFY) 5 MG tablet, Take 1 tablet (5 mg total) by mouth daily., Disp: 90 tablet, Rfl: 1   Ascorbic Acid (VITAMIN C PO), Take by mouth., Disp: , Rfl:    b complex vitamins tablet, Take 1 tablet by mouth daily., Disp: , Rfl:    BIOTIN PO, Take by mouth., Disp: , Rfl:    DULoxetine (CYMBALTA) 60 MG capsule, Take 1 capsule (60 mg total) by mouth daily., Disp: 90 capsule, Rfl: 1   fluticasone furoate-vilanterol (BREO ELLIPTA) 100-25 MCG/INH AEPB, Inhale 1 puff into the lungs daily., Disp: 180 each, Rfl: 1   hydrOXYzine (ATARAX) 10 MG tablet, TAKE 1 TABLET(10 MG) BY MOUTH  THREE TIMES DAILY AS NEEDED, Disp: 90 tablet, Rfl: 1   L-Citrulline POWD, by Does not apply route daily., Disp: , Rfl:    MAGNESIUM PO, Take by mouth., Disp: , Rfl:    naproxen (NAPROSYN) 500 MG tablet, Take 500 mg by mouth 2 (two) times daily as needed., Disp: , Rfl:    rizatriptan (MAXALT) 10 MG tablet, Take 1 tablet (10 mg total) by mouth as needed for migraine. May repeat in 2 hours if needed, Disp: 10 tablet, Rfl: 1   tiZANidine (ZANAFLEX) 4 MG tablet, Take  1 tablet (4 mg total) by mouth at bedtime., Disp: 90 tablet, Rfl: 1   traZODone (DESYREL) 100 MG tablet, Take 1 tablet (100 mg total) by mouth at bedtime. Pt taking 2 tablets qhs, Disp: 90 tablet, Rfl: 0   TWYNEO 0.1-3 % CREA, Apply 1 application topically daily. PRN, Disp: , Rfl:    zinc gluconate 50 MG tablet, Take 50 mg by mouth daily., Disp: , Rfl:    pregabalin (LYRICA) 100 MG capsule, Take 1 capsule (100 mg total) by mouth 2 (two) times daily. (Patient not taking: Reported on 05/03/2021), Disp: 180 capsule, Rfl: 1  Allergies  Allergen Reactions   Statins Other (See Comments)    Fever, chills, body aches   Gluten Meal     I personally reviewed active problem list, medication list, allergies, family history, social history, health maintenance with the patient/caregiver today.   ROS  Constitutional: Negative for fever, positive for  weight change.  Respiratory: Negative for cough and shortness of breath.   Cardiovascular: Negative for chest pain or palpitations.  Gastrointestinal: Negative for abdominal pain, no bowel changes.  Musculoskeletal: Negative for gait problem or joint swelling.  Skin: Negative for rash.  Neurological: Negative for dizziness or headache.  No other specific complaints in a complete review of systems (except as listed in HPI above).   Objective  Vitals:   05/03/21 0949  BP: 118/82  Pulse: 64  Resp: 16  SpO2: 97%  Weight: 214 lb (97.1 kg)  Height: 5' 11"  (1.803 m)    Body mass index is  29.85 kg/m.  Physical Exam  Constitutional: Patient appears well-developed and well-nourished. No distress.  HEENT: head atraumatic, normocephalic, pupils equal and reactive to light,  neck supple Cardiovascular: Normal rate, regular rhythm and normal heart sounds.  No murmur heard. No BLE edema. Pulmonary/Chest: Effort normal and breath sounds normal. No respiratory distress. Abdominal: Soft.  There is no tenderness. Psychiatric: Patient has a normal mood and affect. behavior is normal. Judgment and thought content normal.    PHQ2/9: Depression screen [facility] 2/9 05/03/2021 03/12/2021 02/05/2021 11/02/2020 11/02/2020  Decreased Interest 1 0 2 1 1   Down, Depressed, Hopeless 1 0 2 0 0  PHQ - 2 Score 2 0 4 1 1   Altered sleeping 0 0 1 - 0  Tired, decreased energy 0 0 1 - 0  Change in appetite 0 0 0 - 0  Feeling bad or failure about yourself  0 0 0 - 0  Trouble concentrating 0 0 0 - 0  Moving slowly or fidgety/restless 0 0 0 - 0  Suicidal thoughts 0 0 0 - 0  PHQ-9 Score 2 0 6 - 1  Difficult doing work/chores - Not difficult at all Not difficult at all - Not difficult at all  Some recent data might be hidden    phq 9 is positive   Fall Risk: Fall Risk  05/03/2021 03/12/2021 02/05/2021 11/02/2020 05/02/2020  Falls in the past year? 0 0 0 0 0  Number falls in past yr: 0 0 0 0 0  Injury with Fall? 0 0 0 0 0  Risk for fall due to : No Fall Risks No Fall Risks Orthopedic patient No Fall Risks -  Follow up Falls prevention discussed Falls prevention discussed Falls prevention discussed Falls prevention discussed -      Functional Status Survey: Is the patient deaf or have difficulty hearing?: No Does the patient have difficulty seeing, even when wearing glasses/contacts?: No Does the patient have  difficulty concentrating, remembering, or making decisions?: No Does the patient have difficulty walking or climbing stairs?: No Does the patient have difficulty dressing or bathing?: No Does the patient  have difficulty doing errands alone such as visiting a doctor's office or shopping?: No    Assessment & Plan  1. Atherosclerosis of aorta ([facility])  - Lipid panel  2. Centrilobular emphysema ([place])   3. Seasonal affective disorder ([facility])  - ARIPiprazole (ABILIFY) 5 MG tablet; Take 1 tablet (5 mg total) by mouth daily.  Dispense: 90 tablet; Refill: 1  4. History of drug dependence/abuse ([place])   5. Mild major depression ([facility])  - ARIPiprazole (ABILIFY) 5 MG tablet; Take 1 tablet (5 mg total) by mouth daily.  Dispense: 90 tablet; Refill: 1  6. Long-term use of high-risk medication  - CBC with Differential/Platelet - COMPLETE METABOLIC PANEL WITH GFR  7. History of malignant melanoma of skin   8. Need for shingles vaccine  - Zoster Vaccine Adjuvanted [facility]) injection; Inject 0.5 mLs into the muscle once for 1 dose.  Dispense: 0.5 mL; Refill: 1  9. Migraine without aura and without status migrainosus, not intractable   10. Dyslipidemia   11. RLS (restless legs syndrome)  - Ferritin - pregabalin (LYRICA) 100 MG capsule; Take 1 capsule (100 mg total) by mouth 2 (two) times daily.  Dispense: 180 capsule; Refill: 1  12. Chronic neck and back pain  - DULoxetine (CYMBALTA) 60 MG capsule; Take 1 capsule (60 mg total) by mouth daily.  Dispense: 90 capsule; Refill: 1 - pregabalin (LYRICA) 100 MG capsule; Take 1 capsule (100 mg total) by mouth 2 (two) times daily.  Dispense: 180 capsule; Refill: 1  13. Migraine without aura and with status migrainosus, not intractable  - pregabalin (LYRICA) 100 MG capsule; Take 1 capsule (100 mg total) by mouth 2 (two) times daily.  Dispense: 180 capsule; Refill: 1

## 2021-05-03 ENCOUNTER — Ambulatory Visit (INDEPENDENT_AMBULATORY_CARE_PROVIDER_SITE_OTHER): Payer: Medicare Other | Admitting: Family Medicine

## 2021-05-03 ENCOUNTER — Encounter: Payer: Self-pay | Admitting: Family Medicine

## 2021-05-03 VITALS — BP 118/82 | HR 64 | Resp 16 | Ht 71.0 in | Wt 214.0 lb

## 2021-05-03 DIAGNOSIS — G2581 Restless legs syndrome: Secondary | ICD-10-CM

## 2021-05-03 DIAGNOSIS — F321 Major depressive disorder, single episode, moderate: Secondary | ICD-10-CM | POA: Insufficient documentation

## 2021-05-03 DIAGNOSIS — F338 Other recurrent depressive disorders: Secondary | ICD-10-CM

## 2021-05-03 DIAGNOSIS — I700 Atherosclerosis of aorta: Secondary | ICD-10-CM

## 2021-05-03 DIAGNOSIS — M549 Dorsalgia, unspecified: Secondary | ICD-10-CM

## 2021-05-03 DIAGNOSIS — G43009 Migraine without aura, not intractable, without status migrainosus: Secondary | ICD-10-CM

## 2021-05-03 DIAGNOSIS — J432 Centrilobular emphysema: Secondary | ICD-10-CM | POA: Diagnosis not present

## 2021-05-03 DIAGNOSIS — E785 Hyperlipidemia, unspecified: Secondary | ICD-10-CM

## 2021-05-03 DIAGNOSIS — Z79899 Other long term (current) drug therapy: Secondary | ICD-10-CM

## 2021-05-03 DIAGNOSIS — Z23 Encounter for immunization: Secondary | ICD-10-CM

## 2021-05-03 DIAGNOSIS — Z85820 Personal history of malignant melanoma of skin: Secondary | ICD-10-CM

## 2021-05-03 DIAGNOSIS — M542 Cervicalgia: Secondary | ICD-10-CM

## 2021-05-03 DIAGNOSIS — F1921 Other psychoactive substance dependence, in remission: Secondary | ICD-10-CM

## 2021-05-03 DIAGNOSIS — G43001 Migraine without aura, not intractable, with status migrainosus: Secondary | ICD-10-CM

## 2021-05-03 DIAGNOSIS — G8929 Other chronic pain: Secondary | ICD-10-CM

## 2021-05-03 DIAGNOSIS — F320 Major depressive disorder, single episode, mild: Secondary | ICD-10-CM

## 2021-05-03 MED ORDER — DULOXETINE HCL 60 MG PO CPEP: 60.0000 mg | ORAL_CAPSULE | Freq: Every day | ORAL | 1 refills | Status: DC

## 2021-05-03 MED ORDER — SHINGRIX 50 MCG/0.5ML IM SUSR: 0.5000 mL | Freq: Once | INTRAMUSCULAR | 1 refills | Status: AC

## 2021-05-03 MED ORDER — ARIPIPRAZOLE 5 MG PO TABS: 5.0000 mg | ORAL_TABLET | Freq: Every day | ORAL | 1 refills | Status: DC

## 2021-05-03 MED ORDER — PREGABALIN 100 MG PO CAPS: 100.0000 mg | ORAL_CAPSULE | Freq: Two times a day (BID) | ORAL | 1 refills | Status: DC

## 2021-05-04 LAB — CBC WITH DIFFERENTIAL/PLATELET
Absolute Monocytes: 646 cells/uL (ref 200–950)
Basophils Absolute: 84 cells/uL (ref 0–200)
Basophils Relative: 1.1 %
Eosinophils Absolute: 312 cells/uL (ref 15–500)
Eosinophils Relative: 4.1 %
HCT: 52.1 % — ABNORMAL HIGH (ref 38.5–50.0)
Hemoglobin: 18 g/dL — ABNORMAL HIGH (ref 13.2–17.1)
Lymphs Abs: 2280 cells/uL (ref 850–3900)
MCH: 33.7 pg — ABNORMAL HIGH (ref 27.0–33.0)
MCHC: 34.5 g/dL (ref 32.0–36.0)
MCV: 97.6 fL (ref 80.0–100.0)
MPV: 10.3 fL (ref 7.5–12.5)
Monocytes Relative: 8.5 %
Neutro Abs: 4279 cells/uL (ref 1500–7800)
Neutrophils Relative %: 56.3 %
Platelets: 317 10*3/uL (ref 140–400)
RBC: 5.34 10*6/uL (ref 4.20–5.80)
RDW: 12.7 % (ref 11.0–15.0)
Total Lymphocyte: 30 %
WBC: 7.6 10*3/uL (ref 3.8–10.8)

## 2021-05-04 LAB — COMPLETE METABOLIC PANEL WITH GFR
AG Ratio: 1.6 (calc) (ref 1.0–2.5)
ALT: 37 U/L (ref 9–46)
AST: 23 U/L (ref 10–35)
Albumin: 4.4 g/dL (ref 3.6–5.1)
Alkaline phosphatase (APISO): 83 U/L (ref 35–144)
BUN: 17 mg/dL (ref 7–25)
CO2: 25 mmol/L (ref 20–32)
Calcium: 10.4 mg/dL — ABNORMAL HIGH (ref 8.6–10.3)
Chloride: 106 mmol/L (ref 98–110)
Creat: 1.18 mg/dL (ref 0.70–1.30)
Globulin: 2.8 g/dL (calc) (ref 1.9–3.7)
Glucose, Bld: 84 mg/dL (ref 65–99)
Potassium: 4.7 mmol/L (ref 3.5–5.3)
Sodium: 141 mmol/L (ref 135–146)
Total Bilirubin: 1.2 mg/dL (ref 0.2–1.2)
Total Protein: 7.2 g/dL (ref 6.1–8.1)
eGFR: 71 mL/min/{1.73_m2} (ref >=60)

## 2021-05-04 LAB — FERRITIN: Ferritin: 135 ng/mL (ref 38–380)

## 2021-05-04 LAB — LIPID PANEL
Cholesterol: 244 mg/dL — ABNORMAL HIGH (ref <200)
HDL: 39 mg/dL — ABNORMAL LOW (ref >=40)
LDL Cholesterol (Calc): 183 mg/dL (calc) — ABNORMAL HIGH
Non-HDL Cholesterol (Calc): 205 mg/dL (calc) — ABNORMAL HIGH (ref <130)
Total CHOL/HDL Ratio: 6.3 (calc) — ABNORMAL HIGH (ref <5.0)
Triglycerides: 102 mg/dL (ref <150)

## 2021-06-24 ENCOUNTER — Other Ambulatory Visit: Payer: Self-pay

## 2021-07-02 ENCOUNTER — Ambulatory Visit (INDEPENDENT_AMBULATORY_CARE_PROVIDER_SITE_OTHER): Payer: Medicare Other | Admitting: Internal Medicine

## 2021-07-02 ENCOUNTER — Encounter: Payer: Self-pay | Admitting: Internal Medicine

## 2021-07-02 VITALS — BP 118/78 | HR 107 | Temp 98.0°F | Resp 16 | Ht 72.0 in | Wt 222.3 lb

## 2021-07-02 DIAGNOSIS — J441 Chronic obstructive pulmonary disease with (acute) exacerbation: Secondary | ICD-10-CM | POA: Diagnosis not present

## 2021-07-02 MED ORDER — PREDNISONE 20 MG PO TABS: 20.0000 mg | ORAL_TABLET | Freq: Two times a day (BID) | ORAL | 0 refills | Status: AC

## 2021-07-02 MED ORDER — AMOXICILLIN-POT CLAVULANATE 875-125 MG PO TABS: 1.0000 | ORAL_TABLET | Freq: Two times a day (BID) | ORAL | 0 refills | Status: AC

## 2021-07-02 NOTE — Progress Notes (Signed)
? ?Acute Office Visit ? ?Subjective:  ? ?  ?Patient ID: Ronald Ellis, male    DOB: 1961-02-01, 61 y.o.   MRN: [id number] ? ?Chief Complaint  ?Patient presents with  ? URI  ?  Cough, congestion, sore throat.  Oset 2 weeks  ? ? ?HPI ?Patient is in today for cough, congestion. Symptoms started 2 weeks ago. Albuterol 3-4 tmes a day and Breo. ? ?URI Compliant:  ?-Fever: subjective, feeling chills  ?-Cough: yes, dry  ?-Shortness of breath: yes ?-Wheezing: yes ?-Chest pain: yes, with cough ?-Chest tightness: yes ?-Chest congestion: yes ?-Nasal congestion: yes ?-Runny nose: yes, white/yellow thick ?-Post nasal drip: yes ?-Sneezing: yes ?-Sore throat: yes ?-Sinus pressure: yes, no sinus pain  ?-Headache: no ?-Face pain: no ?-Ear pain: no    ?-Ear pressure: yes bilateral ?-Vomiting: no ?-Sick contacts: yes, wife sick   ?-Context: fluctuating ?-Relief with OTC cold/cough medications: no  ?-Treatments attempted: [place] DM, Tylenol, pseudoephedrine. Does use Breo inhaler daily and is using Albuterol about 3-4 times a day currently.  ? ?Review of Systems  ?Constitutional:  Positive for chills. Negative for fever.  ?HENT:  Positive for congestion and sore throat. Negative for ear pain and sinus pain.   ?Respiratory:  Positive for cough, shortness of breath and wheezing. Negative for sputum production.   ?Cardiovascular:  Negative for chest pain.  ?Gastrointestinal:  Positive for nausea. Negative for abdominal pain, diarrhea and vomiting.  ?Musculoskeletal:  Positive for myalgias.  ?Neurological:  Negative for dizziness and headaches.  ? ? ?   ?Objective:  ?  ?BP 118/78   Pulse (!) 107   Temp 98 ?F (36.7 ?C)   Resp 16   Ht 6' (1.829 m)   Wt 222 lb 4.8 oz (100.8 kg)   SpO2 95%   BMI 30.15 kg/m?  ? ? ?Physical Exam ?Constitutional:   ?   Appearance: Normal appearance.  ?HENT:  ?   Head: Normocephalic and atraumatic.  ?   Right Ear: Tympanic membrane, ear canal and external ear normal.  ?   Left Ear: Tympanic membrane, ear  canal and external ear normal.  ?   Nose: Congestion present.  ?   Mouth/Throat:  ?   Mouth: Mucous membranes are moist.  ?   Comments: Erythematous streaking post nasal drip, no exudate ?Eyes:  ?   Conjunctiva/sclera: Conjunctivae normal.  ?Cardiovascular:  ?   Rate and Rhythm: Normal rate and regular rhythm.  ?Pulmonary:  ?   Effort: Pulmonary effort is normal.  ?   Breath sounds: Wheezing present. No rhonchi or rales.  ?   Comments: Inspiratory wheezes at the bilateral bases ?Musculoskeletal:  ?   Cervical back: No tenderness.  ?   Right lower leg: No edema.  ?   Left lower leg: No edema.  ?Lymphadenopathy:  ?   Cervical: No cervical adenopathy.  ?Skin: ?   General: Skin is warm and dry.  ?Neurological:  ?   General: No focal deficit present.  ?   Mental Status: He is alert. Mental status is at baseline.  ?Psychiatric:     ?   Mood and Affect: Mood normal.     ?   Behavior: Behavior normal.  ? ? ?No results found for any visits on 07/02/21. ? ? ?   ?Assessment & Plan:  ? ?1. COPD exacerbation ([place]): Symptoms going on 2 weeks now with wheezing on exam. Continue Breo daily, can use Albuterol every 2 hours as needed. Will treat with Prednisone  40 mg x 5 days and Augmentin 875-125 mg BID x 1 week. Continue conservative treatments like Mucinex, Flonase, etc. Follow up if symptoms worsen or fail to improve.  ? ?- predniSONE (DELTASONE) 20 MG tablet; Take 1 tablet (20 mg total) by mouth 2 (two) times daily for 5 days.  Dispense: 10 tablet; Refill: 0 ?- amoxicillin-clavulanate (AUGMENTIN) 875-125 MG tablet; Take 1 tablet by mouth 2 (two) times daily for 7 days.  Dispense: 14 tablet; Refill: 0 ? ? ?Return if symptoms worsen or fail to improve. ? ?Teodora Medici, DO ? ? ?

## 2021-07-02 NOTE — Addendum Note (Signed)
Addended by: Teodora Medici on: 07/02/2021 03:25 PM ? ? Modules accepted: Level of Service ? ?

## 2021-07-02 NOTE — Patient Instructions (Signed)
It was great seeing you today! ? ?Plan discussed at today's visit: ?-Prednisone 20 mg twice daily for 5 days and antibiotic twice a day for 7 days ?-Continue Breo daily, can use Albuterol every 2 hours as needed  ? ?Follow up in: as needed  ? ?Take care and let us know if you have any questions or concerns prior to your next visit. ? ?Dr. Rosana Berger ? ?

## 2021-07-17 NOTE — Progress Notes (Deleted)
Name: Ronald Ellis   MRN: [id number]    DOB: May 27, 1960   Date:07/17/2021       Progress Note  Subjective  Chief Complaint  Annual Exam   HPI  Patient presents for annual CPE ***.  IPSS Questionnaire (AUA-7): Over the past month.   1)  How often have you had a sensation of not emptying your bladder completely after you finish urinating?  {Rating:19227}  2)  How often have you had to urinate again less than two hours after you finished urinating? {Rating:19227}  3)  How often have you found you stopped and started again several times when you urinated?  {Rating:19227}  4) How difficult have you found it to postpone urination?  {Rating:19227}  5) How often have you had a weak urinary stream?  {Rating:19227}  6) How often have you had to push or strain to begin urination?  {Rating:19227}  7) How many times did you most typically get up to urinate from the time you went to bed until the time you got up in the morning?  {Rating:19228}  Total score:  0-7 mildly symptomatic   8-19 moderately symptomatic   20-35 severely symptomatic     Diet: *** Exercise: ***  Depression: phq 9 is {gen pos neg:315643}    07/02/2021    2:32 PM 05/03/2021    9:48 AM 03/12/2021    2:13 PM 02/05/2021    3:18 PM 11/02/2020   10:40 AM  Depression screen PHQ 2/9  Decreased Interest 0 1 0 2 1  Down, Depressed, Hopeless 0 1 0 2 0  PHQ - 2 Score 0 2 0 4 1  Altered sleeping 0 0 0 1   Tired, decreased energy 0 0 0 1   Change in appetite 0 0 0 0   Feeling bad or failure about yourself  0 0 0 0   Trouble concentrating 0 0 0 0   Moving slowly or fidgety/restless 0 0 0 0   Suicidal thoughts 0 0 0 0   PHQ-9 Score 0 2 0 6   Difficult doing work/chores Not difficult at all  Not difficult at all Not difficult at all     Hypertension:  BP Readings from Last 3 Encounters:  07/02/21 118/78  05/03/21 118/82  03/12/21 138/80    Obesity: Wt Readings from Last 3 Encounters:  07/02/21 222 lb 4.8 oz (100.8 kg)   05/03/21 214 lb (97.1 kg)  04/26/21 220 lb (99.8 kg)   BMI Readings from Last 3 Encounters:  07/02/21 30.15 kg/m  05/03/21 29.85 kg/m  04/26/21 29.84 kg/m     Lipids:  Lab Results  Component Value Date   CHOL 244 (H) 05/03/2021   CHOL 219 (H) 05/30/2019   CHOL 185 05/25/2018   Lab Results  Component Value Date   HDL 39 (L) 05/03/2021   HDL 42 05/30/2019   HDL 46 05/25/2018   Lab Results  Component Value Date   LDLCALC 183 (H) 05/03/2021   LDLCALC 158 (H) 05/30/2019   LDLCALC 120 (H) 05/25/2018   Lab Results  Component Value Date   TRIG 102 05/03/2021   TRIG 86 05/30/2019   TRIG 88 05/25/2018   Lab Results  Component Value Date   CHOLHDL 6.3 (H) 05/03/2021   CHOLHDL 5.2 (H) 05/30/2019   CHOLHDL 4.0 05/25/2018   No results found for: LDLDIRECT Glucose:  Glucose  Date Value Ref Range Status  05/12/2012 65 65 - 99 mg/dL Final   Glucose, Bld  Date Value Ref Range Status  05/03/2021 84 65 - 99 mg/dL Final    Comment:    .            Fasting reference interval .   04/24/2020 70 65 - 99 mg/dL Final    Comment:    .            Fasting reference interval .   05/30/2019 93 65 - 99 mg/dL Final    Comment:    .            Fasting reference interval .     Flowsheet Row Clinical Support [facility] 02/05/2021 in [facility]  AUDIT-C Score 0      ***  Significant Other STD testing and prevention (HIV/chl/gon/syphilis):  {yes/no/default I/R:51884::"ZYS applicable"} Sexual history:  Hep C Screening: 06/29/2017 Skin cancer: Discussed monitoring for atypical lesions Colorectal cancer: 12/06/2018 (Cologuard) Prostate cancer:  {yes/no/default A/Y:30160::"FUX applicable"} No results found for: PSA   Lung cancer:  Low Dose CT Chest recommended if Age 50-80 years, 30 pack-year currently smoking OR have quit w/in 15years. Patient  no a candidate for screening   AAA: The USPSTF recommends one-time screening with ultrasonography in men ages  4 to 37 years who have ever smoked. Patient   {Response; is /is not/na:63}, a candidate for screening  ECG:  06/24/2019  Vaccines:   HPV:  Tdap:  Shingrix:  Pneumonia:  Flu:  COVID-19:  Advanced Care Planning: A voluntary discussion about advance care planning including the explanation and discussion of advance directives.  Discussed health care proxy and Living will, and the patient was able to identify a health care proxy as ***.  Patient does not have a living will and power of attorney of health care   Patient Active Problem List   Diagnosis Date Noted   Moderate major depression ([place]) 05/03/2021   Nocturnal hypoxia 06/25/2020   Migraine without aura and without status migrainosus, not intractable 05/02/2020   Seasonal affective disorder ([place]) 05/02/2020   History of malignant melanoma of skin 08/03/2019   Chest pain of uncertain etiology 32/35/5732   Atherosclerosis of aorta ([place]) 12/17/2018   Personal history of tobacco use, presenting hazards to health 12/16/2018   History of drug dependence/abuse ([place]) 11/26/2018   Celiac disease 02/05/2018   Right-sided low back pain with right-sided sciatica 12/05/2015   Plantar fasciitis of right foot 10/15/2015   Chronic neck and back pain 07/19/2015    Past Surgical History:  Procedure Laterality Date   BACK SURGERY  2015   BACK SURGERY     INGUINAL HERNIA REPAIR Bilateral    multiple times   lumbar discetomy  2005   MOLE REMOVAL     NECK SURGERY  2016   SPINE SURGERY  12/15/01    Family History  Problem Relation Age of Onset   Hypertension Father    Lung disease Father    Hypertension Brother    Lung cancer Maternal Grandfather     Social History   Socioeconomic History   Marital status: Significant Other    Spouse name: [name]   Number of children: 4   Years of education: Not on file   Highest education level: Some college, no degree  Occupational History   Occupation: disbaled     Comment:  Technical brewer pool coverage   Tobacco Use   Smoking status: Former    Packs/day: 1.00    Years: 42.00    Pack years: 42.00  Types: Cigarettes    Start date: 20    Quit date: 12/13/2011    Years since quitting: 9.6   Smokeless tobacco: Never  Vaping Use   Vaping Use: Never used  Substance and Sexual Activity   Alcohol use: Not Currently    Comment: quit 5-6 years ago, only beer - but glutten free diet now   Drug use: Not Currently    Types: "Crack" cocaine, Cocaine, Heroin, Marijuana, Other-see comments    Comment: meths in his 80's-30's   Sexual activity: Yes    Partners: Female  Other Topics Concern   Not on file  Social History Narrative   Approved for disability 06/2017 ( workman's comp back 2014) , secondary to neck and back injury    Social Determinants of Health   Financial Resource Strain: Low Risk    Difficulty of Paying Living Expenses: Not very hard  Food Insecurity: No Food Insecurity   Worried About Charity fundraiser in the Last Year: Never true   Ran Out of Food in the Last Year: Never true  Transportation Needs: No Transportation Needs   Lack of Transportation (Medical): No   Lack of Transportation (Non-Medical): No  Physical Activity: Inactive   Days of Exercise per Week: 0 days   Minutes of Exercise per Session: 0 min  Stress: No Stress Concern Present   Feeling of Stress : Only a little  Social Connections: Moderately Isolated   Frequency of Communication with Friends and Family: More than three times a week   Frequency of Social Gatherings with Friends and Family: Twice a week   Attends Religious Services: Never   Marine scientist or Organizations: No   Attends Music therapist: Never   Marital Status: Married  Human resources officer Violence: Not At Risk   Fear of Current or Ex-Partner: No   Emotionally Abused: No   Physically Abused: No   Sexually Abused: No     Current Outpatient Medications:    albuterol  (VENTOLIN HFA) 108 (90 Base) MCG/ACT inhaler, Inhale 2 puffs into the lungs every 6 (six) hours as needed for wheezing or shortness of breath., Disp: 18 g, Rfl: 0   ARIPiprazole (ABILIFY) 5 MG tablet, Take 1 tablet (5 mg total) by mouth daily., Disp: 90 tablet, Rfl: 1   Ascorbic Acid (VITAMIN C PO), Take by mouth., Disp: , Rfl:    b complex vitamins tablet, Take 1 tablet by mouth daily., Disp: , Rfl:    BIOTIN PO, Take by mouth., Disp: , Rfl:    DULoxetine (CYMBALTA) 60 MG capsule, Take 1 capsule (60 mg total) by mouth daily., Disp: 90 capsule, Rfl: 1   fluticasone furoate-vilanterol (BREO ELLIPTA) 100-25 MCG/INH AEPB, Inhale 1 puff into the lungs daily., Disp: 180 each, Rfl: 1   hydrOXYzine (ATARAX) 10 MG tablet, TAKE 1 TABLET(10 MG) BY MOUTH THREE TIMES DAILY AS NEEDED, Disp: 90 tablet, Rfl: 1   L-Citrulline POWD, by Does not apply route daily., Disp: , Rfl:    MAGNESIUM PO, Take by mouth., Disp: , Rfl:    naproxen (NAPROSYN) 500 MG tablet, Take 500 mg by mouth 2 (two) times daily as needed., Disp: , Rfl:    pregabalin (LYRICA) 100 MG capsule, Take 1 capsule (100 mg total) by mouth 2 (two) times daily., Disp: 180 capsule, Rfl: 1   rizatriptan (MAXALT) 10 MG tablet, Take 1 tablet (10 mg total) by mouth as needed for migraine. May repeat in 2 hours if needed, Disp:  10 tablet, Rfl: 1   tiZANidine (ZANAFLEX) 4 MG tablet, Take 1 tablet (4 mg total) by mouth at bedtime., Disp: 90 tablet, Rfl: 1   traZODone (DESYREL) 100 MG tablet, Take 1 tablet (100 mg total) by mouth at bedtime. Pt taking 2 tablets qhs, Disp: 90 tablet, Rfl: 0   TWYNEO 0.1-3 % CREA, Apply 1 application topically daily. PRN, Disp: , Rfl:    zinc gluconate 50 MG tablet, Take 50 mg by mouth daily., Disp: , Rfl:   Allergies  Allergen Reactions   Statins Other (See Comments)    Fever, chills, body aches   Gluten Meal      ROS  ***   Objective  There were no vitals filed for this visit.  There is no height or weight on file  to calculate BMI.  Physical Exam ***  Recent Results (from the past 2160 hour(s))  Lipid panel     Status: Abnormal   Collection Time: 05/03/21 10:42 AM  Result Value Ref Range   Cholesterol 244 (H) <200 mg/dL   HDL 39 (L) > OR = 40 mg/dL   Triglycerides 102 <150 mg/dL   LDL Cholesterol (Calc) 183 (H) mg/dL (calc)    Comment: Reference range: <100 . Desirable range <100 mg/dL for primary prevention;   <70 mg/dL for patients with CHD or diabetic patients  with > or = 2 CHD risk factors. Marland Kitchen LDL-C is now calculated using the Martin-Hopkins  calculation, which is a validated novel method providing  better accuracy than the Friedewald equation in the  estimation of LDL-C.  Cresenciano Genre et al. Annamaria Helling. 0109;323(55): 2061-2068  (http://education.[facility].com/faq/FAQ164)    Total CHOL/HDL Ratio 6.3 (H) <5.0 (calc)   Non-HDL Cholesterol (Calc) 205 (H) <130 mg/dL (calc)    Comment: For patients with diabetes plus 1 major ASCVD risk  factor, treating to a non-HDL-C goal of <100 mg/dL  (LDL-C of <70 mg/dL) is considered a therapeutic  option.   CBC with Differential/Platelet     Status: Abnormal   Collection Time: 05/03/21 10:42 AM  Result Value Ref Range   WBC 7.6 3.8 - 10.8 Thousand/uL   RBC 5.34 4.20 - 5.80 Million/uL   Hemoglobin 18.0 (H) 13.2 - 17.1 g/dL   HCT 52.1 (H) 38.5 - 50.0 %   MCV 97.6 80.0 - 100.0 fL   MCH 33.7 (H) 27.0 - 33.0 pg   MCHC 34.5 32.0 - 36.0 g/dL   RDW 12.7 11.0 - 15.0 %   Platelets 317 140 - 400 Thousand/uL   MPV 10.3 7.5 - 12.5 fL   Neutro Abs 4,279 1,500 - 7,800 cells/uL   Lymphs Abs 2,280 850 - 3,900 cells/uL   Absolute Monocytes 646 200 - 950 cells/uL   Eosinophils Absolute 312 15 - 500 cells/uL   Basophils Absolute 84 0 - 200 cells/uL   Neutrophils Relative % 56.3 %   Total Lymphocyte 30.0 %   Monocytes Relative 8.5 %   Eosinophils Relative 4.1 %   Basophils Relative 1.1 %  COMPLETE METABOLIC PANEL WITH GFR     Status: Abnormal    Collection Time: 05/03/21 10:42 AM  Result Value Ref Range   Glucose, Bld 84 65 - 99 mg/dL    Comment: .            Fasting reference interval .    BUN 17 7 - 25 mg/dL   Creat 1.18 0.70 - 1.30 mg/dL   eGFR 71 > OR = 60 mL/min/1.57m  Comment: The eGFR is based on the CKD-EPI 2021 equation. To calculate  the new eGFR from a previous Creatinine or Cystatin C result, go to https://www.kidney.org/professionals/ kdoqi/gfr%5Fcalculator    BUN/Creatinine Ratio NOT APPLICABLE 6 - 22 (calc)   Sodium 141 135 - 146 mmol/L   Potassium 4.7 3.5 - 5.3 mmol/L   Chloride 106 98 - 110 mmol/L   CO2 25 20 - 32 mmol/L   Calcium 10.4 (H) 8.6 - 10.3 mg/dL   Total Protein 7.2 6.1 - 8.1 g/dL   Albumin 4.4 3.6 - 5.1 g/dL   Globulin 2.8 1.9 - 3.7 g/dL (calc)   AG Ratio 1.6 1.0 - 2.5 (calc)   Total Bilirubin 1.2 0.2 - 1.2 mg/dL   Alkaline phosphatase (APISO) 83 35 - 144 U/L   AST 23 10 - 35 U/L   ALT 37 9 - 46 U/L  Ferritin     Status: None   Collection Time: 05/03/21 10:42 AM  Result Value Ref Range   Ferritin 135 38 - 380 ng/mL     Fall Risk:    07/02/2021    2:32 PM 05/03/2021    9:48 AM 03/12/2021    2:13 PM 02/05/2021    3:22 PM 11/02/2020    9:57 AM  Fall Risk   Falls in the past year? 0 0 0 0 0  Number falls in past yr: 0 0 0 0 0  Injury with Fall? 0 0 0 0 0  Risk for fall due to :  No Fall Risks No Fall Risks Orthopedic patient No Fall Risks  Follow up  Falls prevention discussed Falls prevention discussed Falls prevention discussed Falls prevention discussed     Functional Status Survey:      Assessment & Plan  There are no diagnoses linked to this encounter.   -Prostate cancer screening and PSA options (with potential risks and benefits of testing vs not testing) were discussed along with recent recs/guidelines. -USPSTF grade A and B recommendations reviewed with patient; age-appropriate recommendations, preventive care, screening tests, etc discussed and encouraged; healthy  living encouraged; see AVS for patient education given to patient -Discussed importance of 150 minutes of physical activity weekly, eat two servings of fish weekly, eat one serving of tree nuts ( cashews, pistachios, pecans, almonds.Marland Kitchen) every other day, eat 6 servings of fruit/vegetables daily and drink plenty of water and avoid sweet beverages.  -Reviewed Health Maintenance: {yes/no/default V/O:53664::"QIH applicable"}

## 2021-07-18 ENCOUNTER — Encounter: Payer: Medicare Other | Admitting: Family Medicine

## 2021-08-01 NOTE — Progress Notes (Unsigned)
Name: Ronald Ellis   MRN: [id number]    DOB: 1961-02-03   Date:08/02/2021       Progress Note  Subjective  Chief Complaint  Chief Complaint  Patient presents with   Annual Exam    HPI  Patient presents for annual CPE.  IPSS Questionnaire (AUA-7): Over the past month.   1)  How often have you had a sensation of not emptying your bladder completely after you finish urinating?  1 - Less than 1 time in 5  2)  How often have you had to urinate again less than two hours after you finished urinating? 1 - Less than 1 time in 5  3)  How often have you found you stopped and started again several times when you urinated?  5 - Almost always  4) How difficult have you found it to postpone urination?  2 - Less than half the time  5) How often have you had a weak urinary stream?  5 - Almost always  6) How often have you had to push or strain to begin urination?  0 - Not at all  7) How many times did you most typically get up to urinate from the time you went to bed until the time you got up in the morning?  2 - 2 times  Total score:  0-7 mildly symptomatic   8-19 moderately symptomatic   20-35 severely symptomatic    Had a prostate procedure when he was 61 years old at Urology to help with BPH but urinating symptoms getting worse over the last several years. Has not taken any medication, does not want to see Urology at this time.   Diet: follows a Mediterranean diet, likes to cook with butter but eats a lot of veggies. Likes chicken, fish and steak  Exercise: goes to gym a few times a week, walks about 3 miles each time   Depression: phq 9 is negative    07/02/2021    2:32 PM 05/03/2021    9:48 AM 03/12/2021    2:13 PM 02/05/2021    3:18 PM 11/02/2020   10:40 AM  Depression screen PHQ 2/9  Decreased Interest 0 1 0 2 1  Down, Depressed, Hopeless 0 1 0 2 0  PHQ - 2 Score 0 2 0 4 1  Altered sleeping 0 0 0 1   Tired, decreased energy 0 0 0 1   Change in appetite 0 0 0 0   Feeling bad or failure  about yourself  0 0 0 0   Trouble concentrating 0 0 0 0   Moving slowly or fidgety/restless 0 0 0 0   Suicidal thoughts 0 0 0 0   PHQ-9 Score 0 2 0 6   Difficult doing work/chores Not difficult at all  Not difficult at all Not difficult at all     Hypertension:  BP Readings from Last 3 Encounters:  07/02/21 118/78  05/03/21 118/82  03/12/21 138/80    Obesity: Wt Readings from Last 3 Encounters:  08/02/21 222 lb 14.4 oz (101.1 kg)  07/02/21 222 lb 4.8 oz (100.8 kg)  05/03/21 214 lb (97.1 kg)   BMI Readings from Last 3 Encounters:  08/02/21 30.23 kg/m  07/02/21 30.15 kg/m  05/03/21 29.85 kg/m     Lipids:  Lab Results  Component Value Date   CHOL 244 (H) 05/03/2021   CHOL 219 (H) 05/30/2019   CHOL 185 05/25/2018   Lab Results  Component Value Date   HDL  39 (L) 05/03/2021   HDL 42 05/30/2019   HDL 46 05/25/2018   Lab Results  Component Value Date   LDLCALC 183 (H) 05/03/2021   LDLCALC 158 (H) 05/30/2019   LDLCALC 120 (H) 05/25/2018   Lab Results  Component Value Date   TRIG 102 05/03/2021   TRIG 86 05/30/2019   TRIG 88 05/25/2018   Lab Results  Component Value Date   CHOLHDL 6.3 (H) 05/03/2021   CHOLHDL 5.2 (H) 05/30/2019   CHOLHDL 4.0 05/25/2018   HLD: -Medications: None currently, had a reaction to statins in the past, Dr. Ancil Boozer wanted him to try Zetia but he was hesitant at the time. ASCVD risk 12.7% today -Last lipid panel: Lipid Panel     Component Value Date/Time   CHOL 244 (H) 05/03/2021 1042   TRIG 102 05/03/2021 1042   HDL 39 (L) 05/03/2021 1042   CHOLHDL 6.3 (H) 05/03/2021 1042   LDLCALC 183 (H) 05/03/2021 1042    The 10-year ASCVD risk score (Arnett DK, et al., 2019) is: 12.7%   Values used to calculate the score:     Age: 61 years     Sex: Male     Is Non-Hispanic African American: No     Diabetic: No     Tobacco smoker: No     Systolic Blood Pressure: 563 mmHg     Is BP treated: No     HDL Cholesterol: 39 mg/dL     Total  Cholesterol: 244 mg/dL  No results found for: LDLDIRECT Glucose:  Glucose  Date Value Ref Range Status  05/12/2012 65 65 - 99 mg/dL Final   Glucose, Bld  Date Value Ref Range Status  05/03/2021 84 65 - 99 mg/dL Final    Comment:    .            Fasting reference interval .   04/24/2020 70 65 - 99 mg/dL Final    Comment:    .            Fasting reference interval .   05/30/2019 93 65 - 99 mg/dL Final    Comment:    .            Fasting reference interval .     Flowsheet Row Clinical Support [facility] 02/05/2021 in [facility]  AUDIT-C Score 0       Significant Other STD testing and prevention (HIV/chl/gon/syphilis):  no Sexual history: in monogamous relationship Hep C Screening: 06/2017 Skin cancer: Discussed monitoring for atypical lesions Colorectal cancer: Cologuard 12/2018 negative  Prostate cancer:  yes due No results found for: PSA  Lung cancer:  Low Dose CT Chest recommended if Age 57-80 years, 30 pack-year currently smoking OR have quit w/in 15years. Patient  yes a candidate for screening  - last CT lung 2/23 LUNG-RADS 2, will repeat CT scan next year AAA: The USPSTF recommends one-time screening with ultrasonography in men ages 53 to 86 years who have ever smoked. Patient   no, a candidate for screening  ECG:  6/21  Vaccines:   HPV: n/a Tdap: 11/2018 Shingrix: Due - patient states he doesn't think he ever had HSV or chickenpox and is not interested in the vaccine at this time  Pneumonia: 12 and 23  Flu: 2022 COVID-19: 2 vaccines plus one booster   Advanced Care Planning: A voluntary discussion about advance care planning including the explanation and discussion of advance directives.  Discussed health care proxy and Living will, and  the patient was able to identify a health care proxy as partner Ronald Ellis.  Patient does not have a living will and power of attorney of health care.   Patient Active Problem List   Diagnosis Date Noted    Moderate major depression ([place]) 05/03/2021   Nocturnal hypoxia 06/25/2020   Migraine without aura and without status migrainosus, not intractable 05/02/2020   Seasonal affective disorder ([place]) 05/02/2020   History of malignant melanoma of skin 08/03/2019   Chest pain of uncertain etiology 00/17/4944   Atherosclerosis of aorta ([place]) 12/17/2018   Personal history of tobacco use, presenting hazards to health 12/16/2018   History of drug dependence/abuse ([place]) 11/26/2018   Celiac disease 02/05/2018   Right-sided low back pain with right-sided sciatica 12/05/2015   Plantar fasciitis of right foot 10/15/2015   Chronic neck and back pain 07/19/2015    Past Surgical History:  Procedure Laterality Date   BACK SURGERY  2015   BACK SURGERY     INGUINAL HERNIA REPAIR Bilateral    multiple times   lumbar discetomy  2005   MOLE REMOVAL     NECK SURGERY  2016   SPINE SURGERY  12/15/01    Family History  Problem Relation Age of Onset   Hypertension Father    Lung disease Father    Hypertension Brother    Lung cancer Maternal Grandfather     Social History   Socioeconomic History   Marital status: Significant Other    Spouse name: Ronald Ellis   Number of children: 4   Years of education: Not on file   Highest education level: Some college, no degree  Occupational History   Occupation: disbaled     Comment: Technical brewer pool coverage   Tobacco Use   Smoking status: Former    Packs/day: 1.00    Years: 42.00    Pack years: 42.00    Types: Cigarettes    Start date: 76    Quit date: 12/13/2011    Years since quitting: 9.6   Smokeless tobacco: Never  Vaping Use   Vaping Use: Never used  Substance and Sexual Activity   Alcohol use: Not Currently    Comment: quit 5-6 years ago, only beer - but glutten free diet now   Drug use: Not Currently    Types: "Crack" cocaine, Cocaine, Heroin, Marijuana, Other-see comments    Comment: meths in his 20's-30's   Sexual  activity: Yes    Partners: Female  Other Topics Concern   Not on file  Social History Narrative   Approved for disability 06/2017 ( workman's comp back 2014) , secondary to neck and back injury    Social Determinants of Health   Financial Resource Strain: Low Risk    Difficulty of Paying Living Expenses: Not very hard  Food Insecurity: No Food Insecurity   Worried About Charity fundraiser in the Last Year: Never true   Ran Out of Food in the Last Year: Never true  Transportation Needs: No Transportation Needs   Lack of Transportation (Medical): No   Lack of Transportation (Non-Medical): No  Physical Activity: Inactive   Days of Exercise per Week: 0 days   Minutes of Exercise per Session: 0 min  Stress: No Stress Concern Present   Feeling of Stress : Only a little  Social Connections: Moderately Isolated   Frequency of Communication with Friends and Family: More than three times a week   Frequency of Social Gatherings with Friends  and Family: Twice a week   Attends Religious Services: Never   Active Member of Clubs or Organizations: No   Attends Music therapist: Never   Marital Status: Married  Human resources officer Violence: Not At Risk   Fear of Current or Ex-Partner: No   Emotionally Abused: No   Physically Abused: No   Sexually Abused: No     Current Outpatient Medications:    albuterol (VENTOLIN HFA) 108 (90 Base) MCG/ACT inhaler, Inhale 2 puffs into the lungs every 6 (six) hours as needed for wheezing or shortness of breath., Disp: 18 g, Rfl: 0   ARIPiprazole (ABILIFY) 5 MG tablet, Take 1 tablet (5 mg total) by mouth daily., Disp: 90 tablet, Rfl: 1   Ascorbic Acid (VITAMIN C PO), Take by mouth., Disp: , Rfl:    b complex vitamins tablet, Take 1 tablet by mouth daily., Disp: , Rfl:    BIOTIN PO, Take by mouth., Disp: , Rfl:    DULoxetine (CYMBALTA) 60 MG capsule, Take 1 capsule (60 mg total) by mouth daily., Disp: 90 capsule, Rfl: 1   fluticasone  furoate-vilanterol (BREO ELLIPTA) 100-25 MCG/INH AEPB, Inhale 1 puff into the lungs daily., Disp: 180 each, Rfl: 1   hydrOXYzine (ATARAX) 10 MG tablet, TAKE 1 TABLET(10 MG) BY MOUTH THREE TIMES DAILY AS NEEDED, Disp: 90 tablet, Rfl: 1   L-Citrulline POWD, by Does not apply route daily., Disp: , Rfl:    MAGNESIUM PO, Take by mouth., Disp: , Rfl:    naproxen (NAPROSYN) 500 MG tablet, Take 500 mg by mouth 2 (two) times daily as needed., Disp: , Rfl:    pregabalin (LYRICA) 100 MG capsule, Take 1 capsule (100 mg total) by mouth 2 (two) times daily., Disp: 180 capsule, Rfl: 1   rizatriptan (MAXALT) 10 MG tablet, Take 1 tablet (10 mg total) by mouth as needed for migraine. May repeat in 2 hours if needed, Disp: 10 tablet, Rfl: 1   tiZANidine (ZANAFLEX) 4 MG tablet, Take 1 tablet (4 mg total) by mouth at bedtime., Disp: 90 tablet, Rfl: 1   traZODone (DESYREL) 100 MG tablet, Take 1 tablet (100 mg total) by mouth at bedtime. Pt taking 2 tablets qhs, Disp: 90 tablet, Rfl: 0   TWYNEO 0.1-3 % CREA, Apply 1 application topically daily. PRN, Disp: , Rfl:    zinc gluconate 50 MG tablet, Take 50 mg by mouth daily., Disp: , Rfl:   Allergies  Allergen Reactions   Statins Other (See Comments)    Fever, chills, body aches   Gluten Meal      Review of Systems  All other systems reviewed and are negative.     Objective  Vitals:   08/02/21 1302  Pulse: 84  Resp: 16  Temp: 98.3 F (36.8 C)  SpO2: 95%  Weight: 222 lb 14.4 oz (101.1 kg)    Body mass index is 30.23 kg/m.  Physical Exam Constitutional:      Appearance: Normal appearance.  HENT:     Head: Normocephalic and atraumatic.  Eyes:     Conjunctiva/sclera: Conjunctivae normal.  Neck:     Vascular: No carotid bruit.  Cardiovascular:     Rate and Rhythm: Normal rate and regular rhythm.  Pulmonary:     Effort: Pulmonary effort is normal.     Breath sounds: Normal breath sounds.  Abdominal:     General: There is no distension.      Palpations: Abdomen is soft.     Tenderness: There is no  abdominal tenderness.  Musculoskeletal:     Right lower leg: No edema.     Left lower leg: No edema.  Skin:    General: Skin is warm and dry.  Neurological:     General: No focal deficit present.     Mental Status: He is alert. Mental status is at baseline.  Psychiatric:        Mood and Affect: Mood normal.        Behavior: Behavior normal.    No results found for this or any previous visit (from the past 2160 hour(s)).   Fall Risk:    07/02/2021    2:32 PM 05/03/2021    9:48 AM 03/12/2021    2:13 PM 02/05/2021    3:22 PM 11/02/2020    9:57 AM  Fall Risk   Falls in the past year? 0 0 0 0 0  Number falls in past yr: 0 0 0 0 0  Injury with Fall? 0 0 0 0 0  Risk for fall due to :  No Fall Risks No Fall Risks Orthopedic patient No Fall Risks  Follow up  Falls prevention discussed Falls prevention discussed Falls prevention discussed Falls prevention discussed     Assessment & Plan  1. Annual visit for general adult medical examination without abnormal findings: Due for BMP, PSA.   - Basic Metabolic Panel (BMET) - PSA  2. Dyslipidemia: Reviewed lipid panel from last March, LDL high. ASCVD risk high at 12.5%. Had a reaction to statins in the past, is willing to try Zetia today. Start Zetia 10 mg daily, continue to work on decreasing red meats and fatty processed foods in the diet.   - ezetimibe (ZETIA) 10 MG tablet; Take 1 tablet (10 mg total) by mouth daily.  Dispense: 90 tablet; Refill: 3  3. Nocturia associated with benign prostatic hyperplasia: Scored moderately  symptomatic on screening but this as been going on since he was in his mid 20's. Try Flomax, will refer to Urology in the future if he is interested.   - tamsulosin (FLOMAX) 0.4 MG CAPS capsule; Take 1 capsule (0.4 mg total) by mouth daily.  Dispense: 30 capsule; Refill: 3  4. Screening for prostate cancer: PSA today.   - PSA  -Prostate cancer screening and  PSA options (with potential risks and benefits of testing vs not testing) were discussed along with recent recs/guidelines. -USPSTF grade A and B recommendations reviewed with patient; age-appropriate recommendations, preventive care, screening tests, etc discussed and encouraged; healthy living encouraged; see AVS for patient education given to patient -Discussed importance of 150 minutes of physical activity weekly, eat two servings of fish weekly, eat one serving of tree nuts ( cashews, pistachios, pecans, almonds.Marland Kitchen) every other day, eat 6 servings of fruit/vegetables daily and drink plenty of water and avoid sweet beverages.  -Reviewed Health Maintenance: yes

## 2021-08-02 ENCOUNTER — Encounter: Payer: Self-pay | Admitting: Internal Medicine

## 2021-08-02 ENCOUNTER — Ambulatory Visit (INDEPENDENT_AMBULATORY_CARE_PROVIDER_SITE_OTHER): Payer: Medicare Other | Admitting: Internal Medicine

## 2021-08-02 VITALS — BP 136/82 | HR 84 | Temp 98.3°F | Resp 16 | Wt 222.9 lb

## 2021-08-02 DIAGNOSIS — Z125 Encounter for screening for malignant neoplasm of prostate: Secondary | ICD-10-CM

## 2021-08-02 DIAGNOSIS — E785 Hyperlipidemia, unspecified: Secondary | ICD-10-CM | POA: Diagnosis not present

## 2021-08-02 DIAGNOSIS — R351 Nocturia: Secondary | ICD-10-CM | POA: Diagnosis not present

## 2021-08-02 DIAGNOSIS — N401 Enlarged prostate with lower urinary tract symptoms: Secondary | ICD-10-CM | POA: Diagnosis not present

## 2021-08-02 DIAGNOSIS — Z0000 Encounter for general adult medical examination without abnormal findings: Secondary | ICD-10-CM | POA: Diagnosis not present

## 2021-08-02 MED ORDER — EZETIMIBE 10 MG PO TABS
10.0000 mg | ORAL_TABLET | Freq: Every day | ORAL | 3 refills | Status: DC
Start: 2021-08-02 — End: 2022-05-13

## 2021-08-02 MED ORDER — TAMSULOSIN HCL 0.4 MG PO CAPS: 0.4000 mg | ORAL_CAPSULE | Freq: Every day | ORAL | 3 refills | Status: DC

## 2021-08-02 NOTE — Patient Instructions (Signed)
It was great seeing you today!  Plan discussed at today's visit: -Blood work ordered today, results will be uploaded to [facility] medication for cholesterol, Zetia sent to pharmacy -Medication for urination sent to pharmacy as well  Follow up in: scheduled in 6 months, annual in a year   Take care and let us know if you have any questions or concerns prior to your next visit.  Dr. Rosana Berger

## 2021-08-03 LAB — BASIC METABOLIC PANEL
BUN: 11 mg/dL (ref 7–25)
CO2: 29 mmol/L (ref 20–32)
Calcium: 9.8 mg/dL (ref 8.6–10.3)
Chloride: 103 mmol/L (ref 98–110)
Creat: 0.91 mg/dL (ref 0.70–1.30)
Glucose, Bld: 65 mg/dL (ref 65–99)
Potassium: 4.4 mmol/L (ref 3.5–5.3)
Sodium: 139 mmol/L (ref 135–146)

## 2021-08-03 LAB — PSA: PSA: 1.11 ng/mL (ref <=4.00)

## 2021-08-12 ENCOUNTER — Encounter: Payer: Self-pay | Admitting: Family Medicine

## 2021-08-12 DIAGNOSIS — J439 Emphysema, unspecified: Secondary | ICD-10-CM | POA: Insufficient documentation

## 2021-11-11 NOTE — Progress Notes (Unsigned)
Name: Ronald Ellis   MRN: [id number]    DOB: 1960-05-16   Date:11/12/2021       Progress Note  Subjective  Chief Complaint  Follow Up  HPI  Intermittent chest pain: seen by Dr. Saunders Revel,, seems to be related to episodes of bronchitis - feels tight, he has sob but likely from emphysema he stopped taking Atorvastatin because it caused muscle aches  but is taking zetia now   Stress Myoview done 08/05/2019 :     There was no ST segment deviation noted during stress.   No T wave inversion was noted during stress.   The study is normal.   This is a low risk study.   The left ventricular ejection fraction is normal (55-65%).   Suboptimal study due to GI.    Echocardiogram done 08/04/2019  1. Left ventricular ejection fraction, by estimation, is 65 to 70%. The left ventricle has normal function. The left ventricle has no regional wall motion abnormalities. Left ventricular diastolic parameters were normal. 2. Right ventricular systolic function is normal. The right ventricular size is normal. There is normal pulmonary artery systolic pressure. 3. The mitral valve is normal in structure. No evidence of mitral valve regurgitation. No evidence of mitral stenosis. 4. The aortic valve is normal in structure. Aortic valve regurgitation is not visualized. No aortic stenosis is present. 5. The inferior vena cava is normal in size with greater than 50% respiratory variability, suggesting right atrial pressure of 3 mmHg    Emphysema: he smoked cigarettes for about 40 years one pack daily but quit in 2013 , he quit smoking cigars in 2020.Marland Kitchen He was seen by Dr. Mortimer Fries. He is now on prn  2 liters of nocturnal oxygen. His CT chest was done rMarch  and will repeat it yearly He stopped using Breo,  he states no longer having cough or wheezing some sob with moderate activity , still has some anterior chest pain when he goes to bed at night. He sent oxygen back to the company, feels better when sleeping on  ventral decubitus   Melanoma in site 2021 : seeing Dr. Phillip Heal, removed and had Mohl't surgery on his neck . He is up to date with follow ups, he goes every 6 months    Headache: improved, however still takes Advil in am's for body aches. He states migraine headaches back to about once a week and resolves when he takes maxalt - but can last up to 6 hours  it was associated with nausea, throbbing pain, behind right eye and frontal area, he states pain causes vomiting at times, he states maxalt decreases the intensity down to a minimum and he is able to function. He thinks secondary to DDD of cervical spine, he took topamax in the past but not sure why he stopped    Chronic neck and back pain: he has an episode of radiculitis in March with pain radiating down both legs, radiculitis improved with Lyrica. He is still seeing Ortho, had some steroid injections by Dr. Kayleen Memos, he is not longer responding to injections and may have nerve ablation done soon    MDD/seasonal affective disorder: he decided to stop Abilify months ago and is doing well on duloxetine and hdyroxizine prn, usually in am's he stopped trazodone but sleeps well at night. His anger is under control. Discussed seasonal affective symptoms getting worse in the Fall , he will call back to resume lower dose Abilify if needed.   History of drug use: when  he was an young adult, he got married at age 5 , had 2 kids, when he got a divorce at age 52 , he went wild , he did drugs until age 52, he states the reason he quit was because he did a big snort of cocaine, he fell forward and hit his head , he had a laceration and decided not to do it again. He has been doing well, still in remission   Atherosclerosis of aorta: he tried atorvastatin but caused body aches, also failed Crestor, he is taking Zetia now and tolerating it well, he wants to hold off on labs today   Patient Active Problem List   Diagnosis Date Noted   Emphysema lung ([place])  08/12/2021   Moderate major depression ([place]) 05/03/2021   Nocturnal hypoxia 06/25/2020   Migraine without aura and without status migrainosus, not intractable 05/02/2020   Seasonal affective disorder ([place]) 05/02/2020   History of malignant melanoma of skin 08/03/2019   Chest pain of uncertain etiology 68/34/1962   Atherosclerosis of aorta ([place]) 12/17/2018   Personal history of tobacco use, presenting hazards to health 12/16/2018   History of drug dependence/abuse ([place]) 11/26/2018   Celiac disease 02/05/2018   Right-sided low back pain with right-sided sciatica 12/05/2015   Plantar fasciitis of right foot 10/15/2015   Chronic neck and back pain 07/19/2015    Past Surgical History:  Procedure Laterality Date   BACK SURGERY  2015   BACK SURGERY     INGUINAL HERNIA REPAIR Bilateral    multiple times   lumbar discetomy  2005   MOLE REMOVAL     NECK SURGERY  2016   SPINE SURGERY  12/15/01    Family History  Problem Relation Age of Onset   Hypertension Father    Lung disease Father    Hypertension Brother    Lung cancer Maternal Grandfather     Social History   Tobacco Use   Smoking status: Former    Packs/day: 1.00    Years: 42.00    Total pack years: 42.00    Types: Cigarettes    Start date: 28    Quit date: 12/13/2011    Years since quitting: 9.9   Smokeless tobacco: Never  Substance Use Topics   Alcohol use: Not Currently    Comment: quit 5-6 years ago, only beer - but glutten free diet now     Current Outpatient Medications:    albuterol (VENTOLIN HFA) 108 (90 Base) MCG/ACT inhaler, Inhale 2 puffs into the lungs every 6 (six) hours as needed for wheezing or shortness of breath., Disp: 18 g, Rfl: 0   ARIPiprazole (ABILIFY) 5 MG tablet, Take 1 tablet (5 mg total) by mouth daily., Disp: 90 tablet, Rfl: 1   Ascorbic Acid (VITAMIN C PO), Take by mouth., Disp: , Rfl:    b complex vitamins tablet, Take 1 tablet by mouth daily., Disp: , Rfl:    BIOTIN PO, Take by  mouth., Disp: , Rfl:    DULoxetine (CYMBALTA) 60 MG capsule, Take 1 capsule (60 mg total) by mouth daily., Disp: 90 capsule, Rfl: 1   ezetimibe (ZETIA) 10 MG tablet, Take 1 tablet (10 mg total) by mouth daily., Disp: 90 tablet, Rfl: 3   fluticasone furoate-vilanterol (BREO ELLIPTA) 100-25 MCG/INH AEPB, Inhale 1 puff into the lungs daily., Disp: 180 each, Rfl: 1   hydrOXYzine (ATARAX) 10 MG tablet, TAKE 1 TABLET(10 MG) BY MOUTH THREE TIMES DAILY AS NEEDED, Disp: 90 tablet, Rfl: 1  L-Citrulline POWD, by Does not apply route daily., Disp: , Rfl:    MAGNESIUM PO, Take by mouth., Disp: , Rfl:    naproxen (NAPROSYN) 500 MG tablet, Take 500 mg by mouth 2 (two) times daily as needed., Disp: , Rfl:    pregabalin (LYRICA) 100 MG capsule, Take 1 capsule (100 mg total) by mouth 2 (two) times daily., Disp: 180 capsule, Rfl: 1   rizatriptan (MAXALT) 10 MG tablet, Take 1 tablet (10 mg total) by mouth as needed for migraine. May repeat in 2 hours if needed, Disp: 10 tablet, Rfl: 1   tamsulosin (FLOMAX) 0.4 MG CAPS capsule, Take 1 capsule (0.4 mg total) by mouth daily., Disp: 30 capsule, Rfl: 3   tiZANidine (ZANAFLEX) 4 MG tablet, Take 1 tablet (4 mg total) by mouth at bedtime., Disp: 90 tablet, Rfl: 1   traZODone (DESYREL) 100 MG tablet, Take 1 tablet (100 mg total) by mouth at bedtime. Pt taking 2 tablets qhs, Disp: 90 tablet, Rfl: 0   TWYNEO 0.1-3 % CREA, Apply 1 application topically daily. PRN, Disp: , Rfl:    zinc gluconate 50 MG tablet, Take 50 mg by mouth daily., Disp: , Rfl:   Allergies  Allergen Reactions   Statins Other (See Comments)    Fever, chills, body aches   Gluten Meal     I personally reviewed active problem list, medication list, allergies, family history, social history, health maintenance with the patient/caregiver today.   ROS  Constitutional: Negative for fever or weight change.  Respiratory: Negative for cough , positive for shortness of breath with moderate activity .    Cardiovascular: positive for chest pain mostly at night but nopalpitations.  Gastrointestinal: Negative for abdominal pain, no bowel changes.  Musculoskeletal: Negative for gait problem or joint swelling.  Skin: Negative for rash.  Neurological: Negative for dizziness , positive for headache once a week .  No other specific complaints in a complete review of systems (except as listed in HPI above).   Objective  Vitals:   11/12/21 1040  BP: 120/76  Pulse: 91  Resp: 16  SpO2: 94%  Weight: 215 lb (97.5 kg)  Height: 6' (1.829 m)    Body mass index is 29.16 kg/m.  Physical Exam  Constitutional: Patient appears well-developed and well-nourished. Overweight.  No distress.  HEENT: head atraumatic, normocephalic, pupils equal and reactive to light, neck supple Cardiovascular: Normal rate, regular rhythm and normal heart sounds.  No murmur heard. No BLE edema. Pulmonary/Chest: Effort normal and breath sounds normal. No respiratory distress. Abdominal: Soft.  There is no tenderness. Psychiatric: Patient has a normal mood and affect. behavior is normal. Judgment and thought content normal.   PHQ2/9:    11/12/2021   10:40 AM 08/02/2021    1:05 PM 07/02/2021    2:32 PM 05/03/2021    9:48 AM 03/12/2021    2:13 PM  Depression screen PHQ 2/9  Decreased Interest 0 0 0 1 0  Down, Depressed, Hopeless 0 0 0 1 0  PHQ - 2 Score 0 0 0 2 0  Altered sleeping 0 0 0 0 0  Tired, decreased energy 0 0 0 0 0  Change in appetite 0 0 0 0 0  Feeling bad or failure about yourself  0 0 0 0 0  Trouble concentrating 0 0 0 0 0  Moving slowly or fidgety/restless 0 0 0 0 0  Suicidal thoughts 0 0 0 0 0  PHQ-9 Score 0 0 0 2 0  Difficult  doing work/chores  Not difficult at all Not difficult at all  Not difficult at all    phq 9 is negative   Fall Risk:    11/12/2021   10:39 AM 08/02/2021    1:05 PM 07/02/2021    2:32 PM 05/03/2021    9:48 AM 03/12/2021    2:13 PM  Fall Risk   Falls in the past year? 0 0 0 0 0   Number falls in past yr: 0 0 0 0 0  Injury with Fall? 0 0 0 0 0  Risk for fall due to : No Fall Risks   No Fall Risks No Fall Risks  Follow up Falls prevention discussed   Falls prevention discussed Falls prevention discussed      Functional Status Survey: Is the patient deaf or have difficulty hearing?: No Does the patient have difficulty seeing, even when wearing glasses/contacts?: No Does the patient have difficulty concentrating, remembering, or making decisions?: No Does the patient have difficulty walking or climbing stairs?: No Does the patient have difficulty dressing or bathing?: No Does the patient have difficulty doing errands alone such as visiting a doctor's office or shopping?: No    Assessment & Plan  1. Seasonal affective disorder [facility])  He stopped Abilify on his own, but will resume if symptoms returns this Winter   2. Mild major depression ([place])  Doing well at this time  3. Chronic neck and back pain  - DULoxetine (CYMBALTA) 60 MG capsule; Take 1 capsule (60 mg total) by mouth daily.  Dispense: 90 capsule; Refill: 1  4. Moderate major depression ([facility])  - hydrOXYzine (ATARAX) 10 MG tablet; TAKE 1 TABLET(10 MG) BY MOUTH THREE TIMES DAILY AS NEEDED  Dispense: 90 tablet; Refill: 1  5. RLS (restless legs syndrome)  Improved with Lyrica  6. Migraine without aura and with status migrainosus, not intractable   7. Need for immunization against influenza  - Flu Vaccine QUAD 6+ mos PF IM (Fluarix Quad PF)  8. Nocturia associated with benign prostatic hyperplasia  - tamsulosin (FLOMAX) 0.4 MG CAPS capsule; Take 1 capsule (0.4 mg total) by mouth daily.  Dispense: 90 capsule; Refill: 1  9. Lower urinary tract symptoms (LUTS)  Doing well on Flamax

## 2021-11-12 ENCOUNTER — Encounter: Payer: Self-pay | Admitting: Family Medicine

## 2021-11-12 ENCOUNTER — Ambulatory Visit (INDEPENDENT_AMBULATORY_CARE_PROVIDER_SITE_OTHER): Payer: Medicare Other | Admitting: Family Medicine

## 2021-11-12 VITALS — BP 120/76 | HR 91 | Resp 16 | Ht 72.0 in | Wt 215.0 lb

## 2021-11-12 DIAGNOSIS — N401 Enlarged prostate with lower urinary tract symptoms: Secondary | ICD-10-CM

## 2021-11-12 DIAGNOSIS — M549 Dorsalgia, unspecified: Secondary | ICD-10-CM

## 2021-11-12 DIAGNOSIS — M542 Cervicalgia: Secondary | ICD-10-CM | POA: Diagnosis not present

## 2021-11-12 DIAGNOSIS — G2581 Restless legs syndrome: Secondary | ICD-10-CM

## 2021-11-12 DIAGNOSIS — F321 Major depressive disorder, single episode, moderate: Secondary | ICD-10-CM | POA: Diagnosis not present

## 2021-11-12 DIAGNOSIS — Z23 Encounter for immunization: Secondary | ICD-10-CM

## 2021-11-12 DIAGNOSIS — G8929 Other chronic pain: Secondary | ICD-10-CM

## 2021-11-12 DIAGNOSIS — F338 Other recurrent depressive disorders: Secondary | ICD-10-CM | POA: Diagnosis not present

## 2021-11-12 DIAGNOSIS — G43001 Migraine without aura, not intractable, with status migrainosus: Secondary | ICD-10-CM

## 2021-11-12 DIAGNOSIS — F320 Major depressive disorder, single episode, mild: Secondary | ICD-10-CM

## 2021-11-12 DIAGNOSIS — R351 Nocturia: Secondary | ICD-10-CM

## 2021-11-12 DIAGNOSIS — R399 Unspecified symptoms and signs involving the genitourinary system: Secondary | ICD-10-CM

## 2021-11-12 MED ORDER — HYDROXYZINE HCL 10 MG PO TABS: ORAL_TABLET | ORAL | 1 refills | Status: DC

## 2021-11-12 MED ORDER — TAMSULOSIN HCL 0.4 MG PO CAPS: 0.4000 mg | ORAL_CAPSULE | Freq: Every day | ORAL | 1 refills | Status: DC

## 2021-11-12 MED ORDER — DULOXETINE HCL 60 MG PO CPEP: 60.0000 mg | ORAL_CAPSULE | Freq: Every day | ORAL | 1 refills | Status: DC

## 2022-01-01 IMAGING — CT CT CHEST LUNG CANCER SCREENING LOW DOSE W/O CM
2 of 5 series · 15 of 40 positions shown, 18 images · non-contrast
Comparison: 12/16/2018

CLINICAL DATA: Forty pack-year smoking history, quitting 8 years
ago

EXAM:
CT CHEST WITHOUT CONTRAST LOW-DOSE FOR LUNG CANCER SCREENING
TECHNIQUE: Multidetector CT imaging of the chest was performed following the
standard protocol without IV contrast.

[Series 3: lung 1.00 · axial · 0.81mm/px · z∈[-1254,-910]mm · 12 of 380 slices shown, 15 images]
[im 18/380  mediastinal]
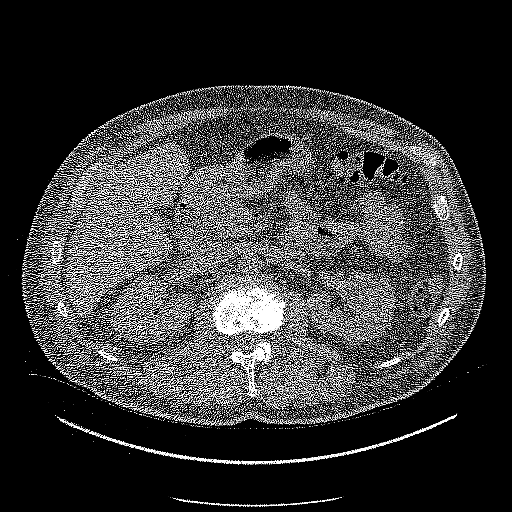
[im 18/380  lung]
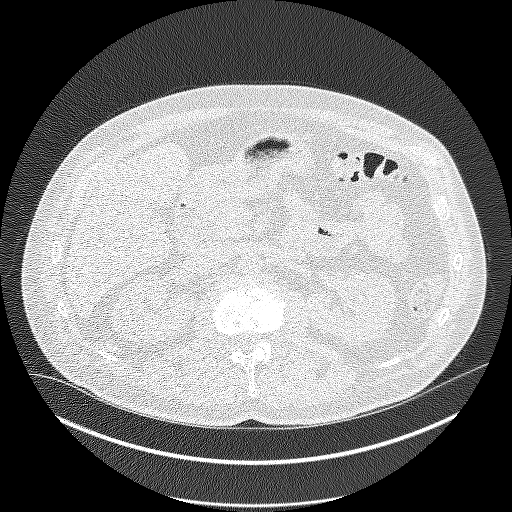
[im 52/380  lung]
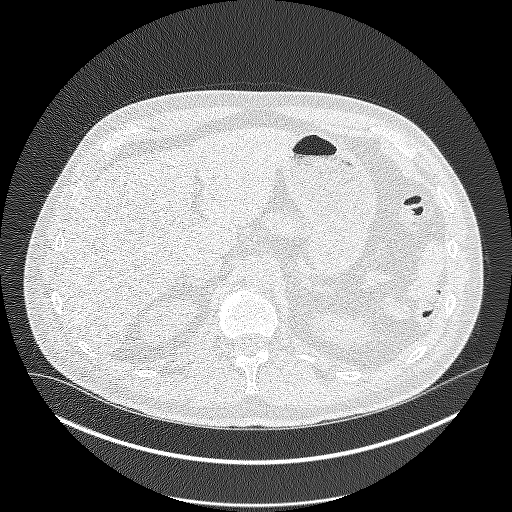
[im 87/380  lung]
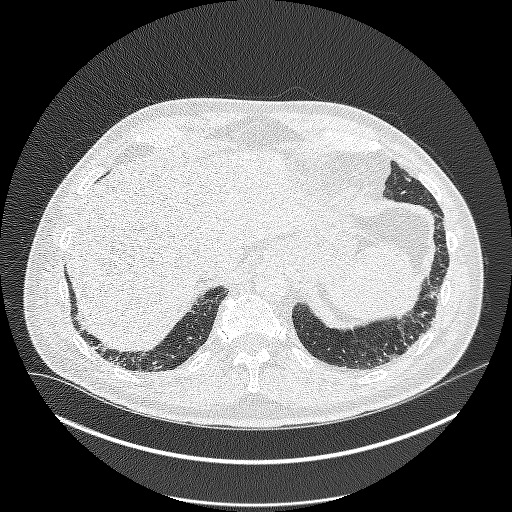
[im 121/380  lung]
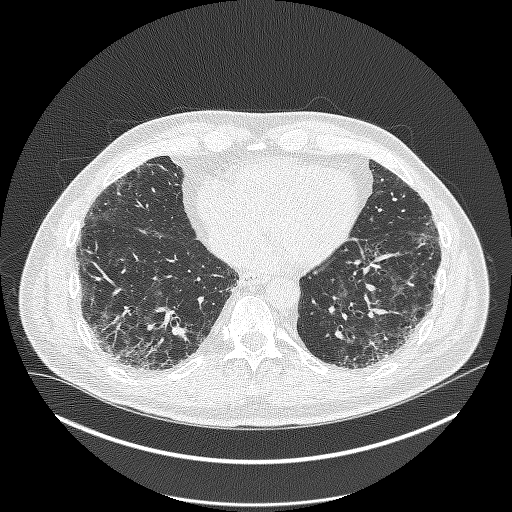
[im 138/380  mediastinal]
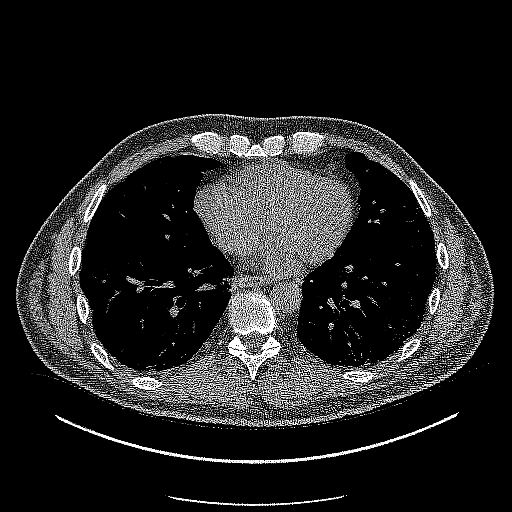
[im 138/380  lung]
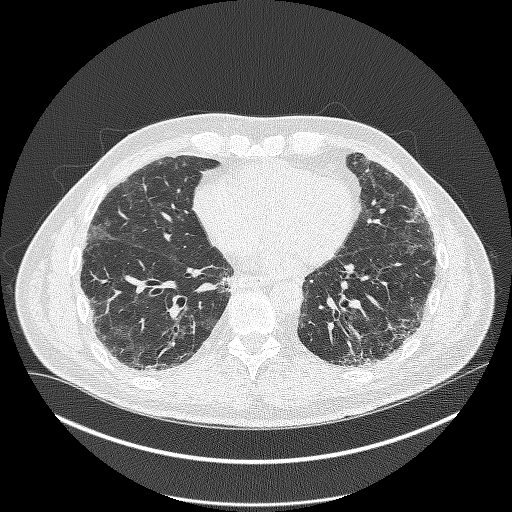
[im 173/380  lung]
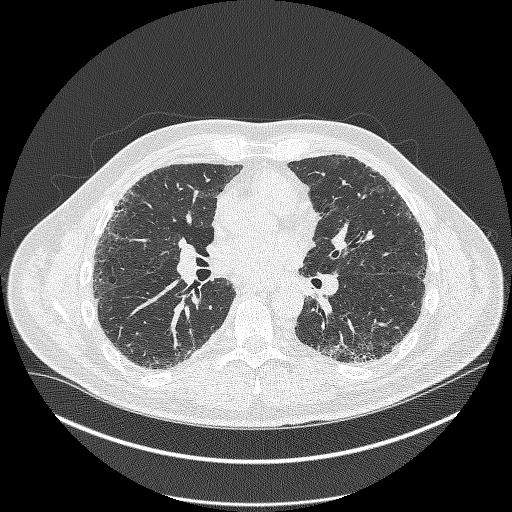
[im 207/380  lung]
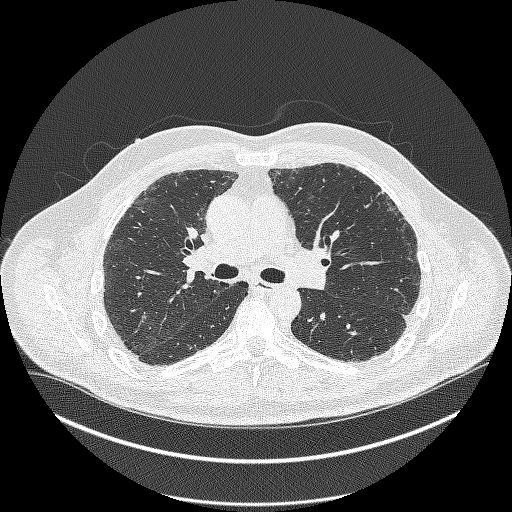
[im 242/380  lung]
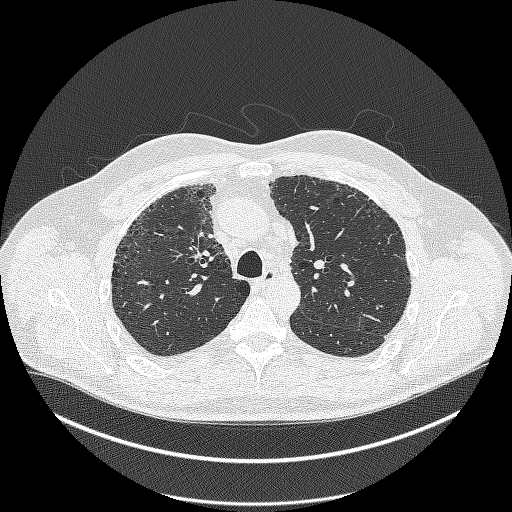
[im 259/380  mediastinal]
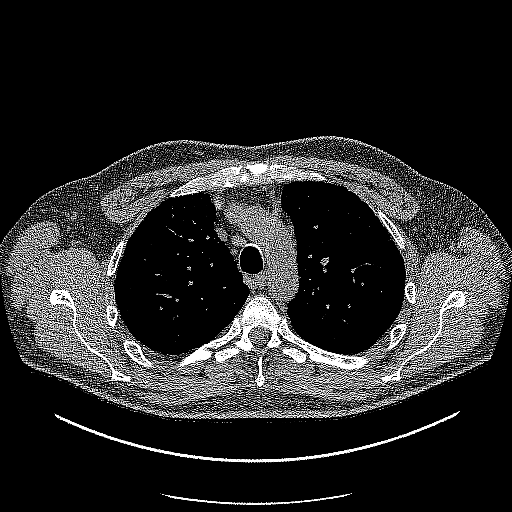
[im 259/380  lung]
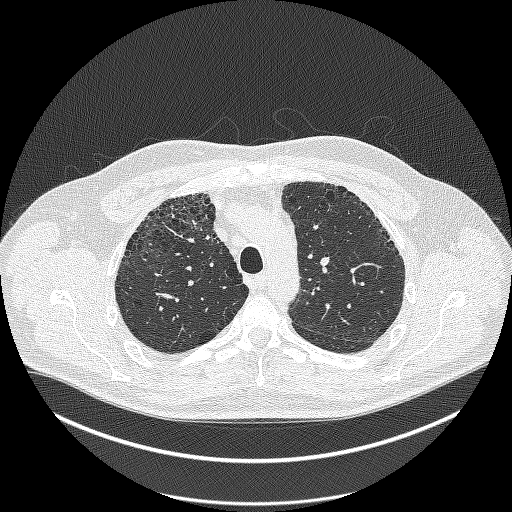
[im 293/380  lung]
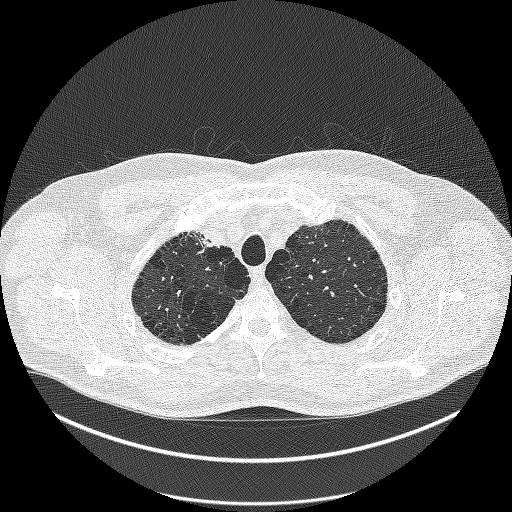
[im 328/380  lung]
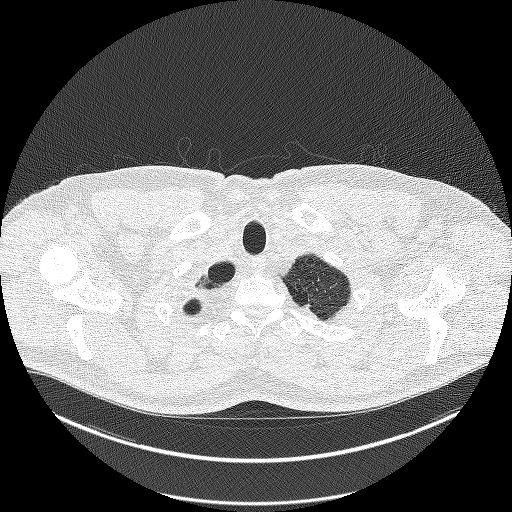
[im 362/380  lung]
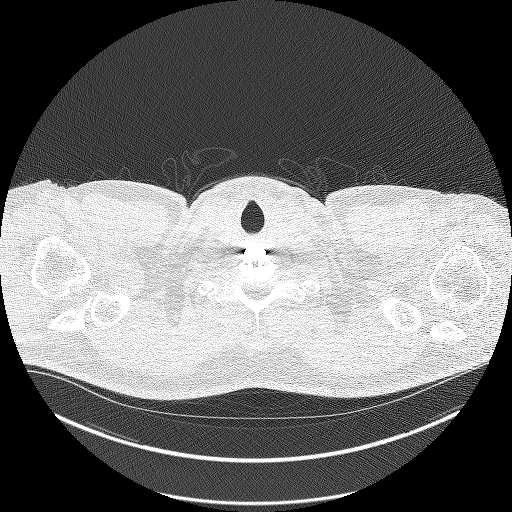

[Series 4: coronals lung 1.00 cor · coronal · 0.74mm/px · 3 of 361 slices shown]
[im 73/361  lung]
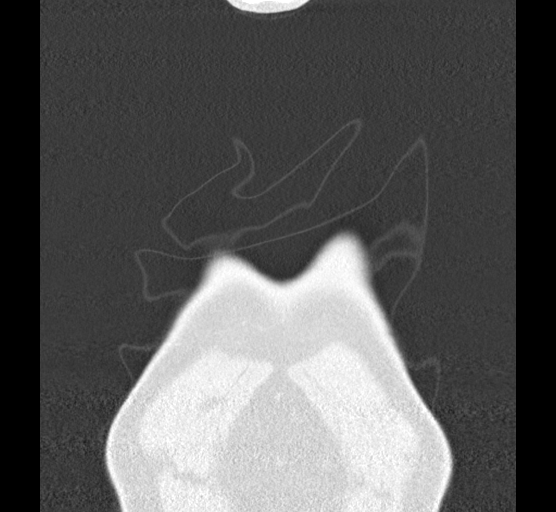
[im 145/361  lung]
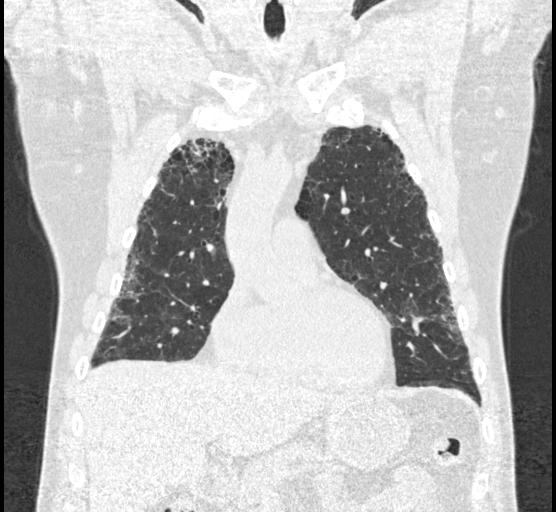
[im 217/361  lung]
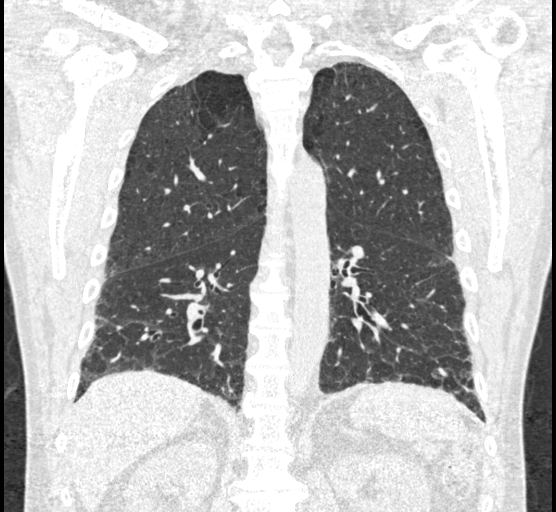

[15 of 40 positions shown; findings below may reference images not displayed]

FINDINGS: Cardiovascular: Aortic atherosclerosis. Tortuous thoracic aorta.
Normal heart size, without pericardial effusion.

Mediastinum/Nodes: Prominent but not pathologically sized middle and
anterior mediastinal nodes are similar. Hilar regions poorly
evaluated without intravenous contrast.

Lungs/Pleura: No pleural fluid. Moderate centrilobular emphysema.
Redemonstration of interstitial lung disease, including areas of
subpleural reticulation, peripheral ground-glass, traction
bronchiolectasis. Mild craniocaudal gradient. Bilateral calcified
granulomas.

A central right middle lobe pulmonary nodule of volume derived
equivalent diameter 9.4 mm, including on 222/3, is similar on the
prior exam.

Upper Abdomen: Normal imaged portions of the liver, spleen, stomach,
pancreas, gallbladder, adrenal glands, kidneys.

Musculoskeletal: Lower cervical spine fixation. L2-3 advanced
degenerate disc disease is chronic.
IMPRESSION: 1. Lung-RADS 2, benign appearance or behavior. Continue annual
screening with low-dose chest CT without contrast in 12 months.
2. Similar appearance of interstitial lung disease, with
differential considerations of nonspecific interstitial pneumonitis
and mild usual interstitial pneumonitis.
3. Aortic Atherosclerosis (33ADR-Y11.1) and Emphysema (33ADR-99W.V).

## 2022-02-05 ENCOUNTER — Ambulatory Visit (INDEPENDENT_AMBULATORY_CARE_PROVIDER_SITE_OTHER): Payer: Medicare Other | Admitting: Physician Assistant

## 2022-02-05 ENCOUNTER — Encounter: Payer: Self-pay | Admitting: Physician Assistant

## 2022-02-05 DIAGNOSIS — Z0000 Encounter for general adult medical examination without abnormal findings: Secondary | ICD-10-CM

## 2022-02-05 NOTE — Patient Instructions (Signed)
Preventive Care 40-64 Years, Male Preventive care refers to lifestyle choices and visits with your health care provider that can promote health and wellness. What does preventive care include? A yearly physical exam. This is also called an annual well check. Dental exams once or twice a year. Routine eye exams. Ask your health care provider how often you should have your eyes checked. Personal lifestyle choices, including: Daily care of your teeth and gums. Regular physical activity. Eating a healthy diet. Avoiding tobacco and drug use. Limiting alcohol use. Practicing safe sex. Taking low-dose aspirin every day starting at age 60. What happens during an annual well check? The services and screenings done by your health care provider during your annual well check will depend on your age, overall health, lifestyle risk factors, and family history of disease. Counseling  Your health care provider may ask you questions about your: Alcohol use. Tobacco use. Drug use. Emotional well-being. Home and relationship well-being. Sexual activity. Eating habits. Work and work Statistician. Screening  You may have the following tests or measurements: Height, weight, and BMI. Blood pressure. Lipid and cholesterol levels. These may be checked every 5 years, or more frequently if you are over 74 years old. Skin check. Lung cancer screening. You may have this screening every year starting at age 56 if you have a 30-pack-year history of smoking and currently smoke or have quit within the past 15 years. Fecal occult blood test (FOBT) of the stool. You may have this test every year starting at age 83. Flexible sigmoidoscopy or colonoscopy. You may have a sigmoidoscopy every 5 years or a colonoscopy every 10 years starting at age 48. Prostate cancer screening. Recommendations will vary depending on your family history and other risks. Hepatitis C blood test. Hepatitis B blood test. Sexually  transmitted disease (STD) testing. Diabetes screening. This is done by checking your blood sugar (glucose) after you have not eaten for a while (fasting). You may have this done every 1-3 years. Discuss your test results, treatment options, and if necessary, the need for more tests with your health care provider. Vaccines  Your health care provider may recommend certain vaccines, such as: Influenza vaccine. This is recommended every year. Tetanus, diphtheria, and acellular pertussis (Tdap, Td) vaccine. You may need a Td booster every 10 years. Zoster vaccine. You may need this after age 10. Pneumococcal 13-valent conjugate (PCV13) vaccine. You may need this if you have certain conditions and have not been vaccinated. Pneumococcal polysaccharide (PPSV23) vaccine. You may need one or two doses if you smoke cigarettes or if you have certain conditions. Talk to your health care provider about which screenings and vaccines you need and how often you need them. This information is not intended to replace advice given to you by your health care provider. Make sure you discuss any questions you have with your health care provider. Document Released: 03/16/2015 Document Revised: 11/07/2015 Document Reviewed: 12/19/2014 Elsevier Interactive Patient Education  2017 [employer] Prevention in the Home Falls can cause injuries. They can happen to people of all ages. There are many things you can do to make your home safe and to help prevent falls. What can I do on the outside of my home? Regularly fix the edges of walkways and driveways and fix any cracks. Remove anything that might make you trip as you walk through a door, such as a raised step or threshold. Trim any bushes or trees on the path to your home. Use bright outdoor  lighting. Clear any walking paths of anything that might make someone trip, such as rocks or tools. Regularly check to see if handrails are loose or broken. Make sure that  both sides of any steps have handrails. Any raised decks and porches should have guardrails on the edges. Have any leaves, snow, or ice cleared regularly. Use sand or salt on walking paths during winter. Clean up any spills in your garage right away. This includes oil or grease spills. What can I do in the bathroom? Use night lights. Install grab bars by the toilet and in the tub and shower. Do not use towel bars as grab bars. Use non-skid mats or decals in the tub or shower. If you need to sit down in the shower, use a plastic, non-slip stool. Keep the floor dry. Clean up any water that spills on the floor as soon as it happens. Remove soap buildup in the tub or shower regularly. Attach bath mats securely with double-sided non-slip rug tape. Do not have throw rugs and other things on the floor that can make you trip. What can I do in the bedroom? Use night lights. Make sure that you have a light by your bed that is easy to reach. Do not use any sheets or blankets that are too big for your bed. They should not hang down onto the floor. Have a firm chair that has side arms. You can use this for support while you get dressed. Do not have throw rugs and other things on the floor that can make you trip. What can I do in the kitchen? Clean up any spills right away. Avoid walking on wet floors. Keep items that you use a lot in easy-to-reach places. If you need to reach something above you, use a strong step stool that has a grab bar. Keep electrical cords out of the way. Do not use floor polish or wax that makes floors slippery. If you must use wax, use non-skid floor wax. Do not have throw rugs and other things on the floor that can make you trip. What can I do with my stairs? Do not leave any items on the stairs. Make sure that there are handrails on both sides of the stairs and use them. Fix handrails that are broken or loose. Make sure that handrails are as long as the stairways. Check  any carpeting to make sure that it is firmly attached to the stairs. Fix any carpet that is loose or worn. Avoid having throw rugs at the top or bottom of the stairs. If you do have throw rugs, attach them to the floor with carpet tape. Make sure that you have a light switch at the top of the stairs and the bottom of the stairs. If you do not have them, ask someone to add them for you. What else can I do to help prevent falls? Wear shoes that: Do not have high heels. Have rubber bottoms. Are comfortable and fit you well. Are closed at the toe. Do not wear sandals. If you use a stepladder: Make sure that it is fully opened. Do not climb a closed stepladder. Make sure that both sides of the stepladder are locked into place. Ask someone to hold it for you, if possible. Clearly mark and make sure that you can see: Any grab bars or handrails. First and last steps. Where the edge of each step is. Use tools that help you move around (mobility aids) if they are needed. These include:  Canes. Walkers. Scooters. Crutches. Turn on the lights when you go into a dark area. Replace any light bulbs as soon as they burn out. Set up your furniture so you have a clear path. Avoid moving your furniture around. If any of your floors are uneven, fix them. If there are any pets around you, be aware of where they are. Review your medicines with your doctor. Some medicines can make you feel dizzy. This can increase your chance of falling. Ask your doctor what other things that you can do to help prevent falls. This information is not intended to replace advice given to you by your health care provider. Make sure you discuss any questions you have with your health care provider. Document Released: 12/14/2008 Document Revised: 07/26/2015 Document Reviewed: 03/24/2014 Elsevier Interactive Patient Education  2017 [employer].

## 2022-02-05 NOTE — Progress Notes (Signed)
Virtual Visit via Telephone Note  I connected with  Ronald Ellis on 02/05/22 at 10:00 AM  by telephone and verified that I am speaking with the correct person using two identifiers.  Location: Patient: at home in [place], [place]  Provider: [facility], [place], [place] participating in the virtual visit: [place]   I discussed the limitations, risks, security and privacy concerns of performing an evaluation and management service by telephone and the availability of in person appointments. The patient expressed understanding and agreed to proceed.  Interactive audio and video telecommunications were attempted between this nurse and patient, however failed, due to patient having technical difficulties OR patient did not have access to video capability.  We continued and completed visit with audio only.  Some vital signs may be absent or patient reported.  Subjective:   Ronald Ellis is a 61 y.o. male who presents for Medicare Annual/Subsequent preventive examination.   Today's Provider: Talitha Givens, MHS, PA-C Introduced myself to the patient as a PA-C and provided education on APPs in clinical practice.   Review of Systems:         Objective:    Vitals: There were no vitals taken for this visit.  There is no height or weight on file to calculate BMI.     02/05/2021    3:21 PM 02/02/2020    2:44 PM 01/14/2019   10:22 AM 10/01/2016   11:17 AM 02/15/2016   10:19 AM 01/16/2016    9:59 AM 12/05/2015   10:48 AM  Advanced Directives  Does Patient Have a Medical Advance Directive? Yes No No No No No No  Type of Paramedic of [place];Living will        Copy of [place] in Chart? No - copy requested        Would patient like information on creating a medical advance directive?  No - Patient declined Yes (MAU/Ambulatory/Procedural Areas - Information given)   No - patient declined information No - patient  declined information    Tobacco Social History   Tobacco Use  Smoking Status Former   Packs/day: 1.00   Years: 42.00   Total pack years: 42.00   Types: Cigarettes   Start date: 84   Quit date: 12/13/2011   Years since quitting: 10.1  Smokeless Tobacco Never     Counseling given: Not Answered   Clinical Intake:    Past Medical History:  Diagnosis Date   Arthritis    Chronic pain    COPD (chronic obstructive pulmonary disease) ([place])    Depression 08/06/81   Gluten intolerance    Oxygen deficiency    Pneumonia    Past Surgical History:  Procedure Laterality Date   BACK SURGERY  2015   BACK SURGERY     INGUINAL HERNIA REPAIR Bilateral    multiple times   lumbar discetomy  2005   MOLE REMOVAL     NECK SURGERY  2016   SPINE SURGERY  12/15/01   Family History  Problem Relation Age of Onset   Hypertension Father    Lung disease Father    Hypertension Brother    Lung cancer Maternal Grandfather    Social History   Socioeconomic History   Marital status: Significant Other    Spouse name: [name]   Number of children: 4   Years of education: Not on file   Highest education level: Some college, no degree  Occupational History  Occupation: disbaled     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   Tobacco Use   Smoking status: Former    Packs/day: 1.00    Years: 42.00    Total pack years: 42.00    Types: Cigarettes    Start date: 51    Quit date: 12/13/2011    Years since quitting: 10.1   Smokeless tobacco: Never  Vaping Use   Vaping Use: Never used  Substance and Sexual Activity   Alcohol use: Not Currently    Comment: quit 5-6 years ago, only beer - but glutten free diet now   Drug use: Not Currently    Types: "Crack" cocaine, Cocaine, Heroin, Marijuana, Other-see comments    Comment: meths in his 58's-30's   Sexual activity: Yes    Partners: Female  Other Topics Concern   Not on file  Social History Narrative   Approved for  disability 06/2017 ( workman's comp back 2014) , secondary to neck and back injury    Social Determinants of Health   Financial Resource Strain: Low Risk  (02/05/2022)   Overall Financial Resource Strain (CARDIA)    Difficulty of Paying Living Expenses: Not hard at all  Food Insecurity: No Food Insecurity (02/05/2022)   Hunger Vital Sign    Worried About Running Out of Food in the Last Year: Never true    Ran Out of Food in the Last Year: Never true  Transportation Needs: No Transportation Needs (02/05/2022)   PRAPARE - Hydrologist (Medical): No    Lack of Transportation (Non-Medical): No  Physical Activity: Insufficiently Active (02/05/2022)   Exercise Vital Sign    Days of Exercise per Week: 2 days    Minutes of Exercise per Session: 30 min  Stress: No Stress Concern Present (02/05/2022)   [place]    Feeling of Stress : Only a little  Social Connections: Socially Isolated (02/05/2022)   Social Connection and Isolation Panel [NHANES]    Frequency of Communication with Friends and Family: More than three times a week    Frequency of Social Gatherings with Friends and Family: More than three times a week    Attends Religious Services: Never    Marine scientist or Organizations: No    Attends Archivist Meetings: Never    Marital Status: Never married    Outpatient Encounter Medications as of 02/05/2022  Medication Sig   albuterol (VENTOLIN HFA) 108 (90 Base) MCG/ACT inhaler Inhale 2 puffs into the lungs every 6 (six) hours as needed for wheezing or shortness of breath.   Ascorbic Acid (VITAMIN C PO) Take by mouth.   BIOTIN PO Take by mouth.   DULoxetine (CYMBALTA) 60 MG capsule Take 1 capsule (60 mg total) by mouth daily.   ezetimibe (ZETIA) 10 MG tablet Take 1 tablet (10 mg total) by mouth daily.   hydrOXYzine (ATARAX) 10 MG tablet TAKE 1 TABLET(10 MG) BY MOUTH THREE TIMES  DAILY AS NEEDED   L-Citrulline POWD by Does not apply route daily.   MAGNESIUM PO Take by mouth.   Misc Natural Products (MAGIC MUSHROOM MIX) CAPS Take 2 capsules by mouth daily.   naproxen (NAPROSYN) 500 MG tablet Take 500 mg by mouth 2 (two) times daily as needed.   pregabalin (LYRICA) 100 MG capsule Take 1 capsule (100 mg total) by mouth 2 (two) times daily.   rizatriptan (MAXALT) 10 MG tablet Take 1 tablet (10  mg total) by mouth as needed for migraine. May repeat in 2 hours if needed   tamsulosin (FLOMAX) 0.4 MG CAPS capsule Take 1 capsule (0.4 mg total) by mouth daily.   tiZANidine (ZANAFLEX) 4 MG tablet Take 1 tablet (4 mg total) by mouth at bedtime.   TWYNEO 0.1-3 % CREA Apply 1 application topically daily. PRN   zinc gluconate 50 MG tablet Take 50 mg by mouth daily.   b complex vitamins tablet Take 1 tablet by mouth daily.   No facility-administered encounter medications on file as of 02/05/2022.    Activities of Daily Living    02/05/2022    9:23 AM 11/12/2021   10:40 AM  In your present state of health, do you have any difficulty performing the following activities:  Hearing? 0 0  Vision? 0 0  Difficulty concentrating or making decisions? 0 0  Walking or climbing stairs? 0 0  Dressing or bathing? 0 0  Doing errands, shopping? 0 0    Patient Care Team: Steele Sizer, MD as PCP - General (Family Medicine) End, Harrell Gave, MD as PCP - Cardiology (Cardiology) Flora Lipps, MD as Consulting Physician (Pulmonary Disease) Ree Edman, MD (Dermatology) Carmelia Roller, MD as Referring Physician (Orthopedic Surgery) Lovell Sheehan, MD as Consulting Physician (Orthopedic Surgery)   Assessment:   This is a routine wellness examination for [place].  Exercise Activities and Dietary recommendations     Goals Addressed   None     Fall Risk:    02/05/2022    9:23 AM 11/12/2021   10:39 AM 08/02/2021    1:05 PM 07/02/2021    2:32 PM 05/03/2021    9:48 AM  Fall Risk    Falls in the past year? 0 0 0 0 0  Number falls in past yr: 0 0 0 0 0  Injury with Fall? 0 0 0 0 0  Risk for fall due to : No Fall Risks No Fall Risks   No Fall Risks  Follow up Falls prevention discussed;Education provided;Falls evaluation completed Falls prevention discussed   Falls prevention discussed    FALL RISK PREVENTION PERTAINING TO THE HOME:  Any stairs in or around the home? Yes  If so, are there any without handrails? Yes   Home free of loose throw rugs in walkways, pet beds, electrical cords, etc? Yes  Adequate lighting in your home to reduce risk of falls? Yes   ASSISTIVE DEVICES UTILIZED TO PREVENT FALLS:  Life alert? No  Use of a cane, walker or w/c? No  Grab bars in the bathroom? No  Shower chair or bench in shower? No  Elevated toilet seat or a handicapped toilet? Yes   TIMED UP AND GO:  Was the test performed? No .  Length of time to ambulate 10 feet: 0 sec.   GAIT:  Education: Fall risk prevention has been discussed.  Intervention(s) required? No  DME/home health order needed?  No   Depression Screen    02/05/2022    9:23 AM 11/12/2021   10:40 AM 08/02/2021    1:05 PM 07/02/2021    2:32 PM  PHQ 2/9 Scores  PHQ - 2 Score 0 0 0 0  PHQ- 9 Score 0 0 0 0    Cognitive Function        02/05/2022   10:14 AM  6CIT Screen  What Year? 0 points  What month? 0 points  What time? 0 points  Count back from 20 0 points  Months  in reverse 4 points  Repeat phrase 0 points  Total Score 4 points        02/05/2022   10:14 AM  6CIT Screen  What Year? 0 points  What month? 0 points  What time? 0 points  Count back from 20 0 points  Months in reverse 4 points  Repeat phrase 0 points  Total Score 4 points    Immunization History  Administered Date(s) Administered   Influenza,inj,Quad PF,6+ Mos 04/27/2019, 01/02/2020, 11/02/2020, 11/12/2021   PFIZER(Purple Top)SARS-COV-2 Vaccination 05/26/2019, 06/20/2019, 02/14/2020   Pneumococcal Conjugate-13  01/02/2020   Pneumococcal Polysaccharide-23 06/29/2017   Tdap 11/26/2018    Qualifies for Shingles Vaccine? Yes  . Due for Shingrix. Education has been provided regarding the importance of this vaccine. Pt has been advised to call insurance company to determine out of pocket expense. Advised may also receive vaccine at local pharmacy or Health Dept. Verbalized acceptance and understanding.  Tdap: Although this vaccine is not a covered service during a Wellness Exam, does the patient still wish to receive this vaccine today?   Completed 11/25/2028 .  Education has been provided regarding the importance of this vaccine. Advised may receive this vaccine at local pharmacy or Health Dept. Aware to provide a copy of the vaccination record if obtained from local pharmacy or Health Dept. Verbalized acceptance and understanding.  Flu Vaccine:   Completed 11/12/2021 . Education has been provided regarding the importance of this vaccine but still declined. Advised may receive this vaccine at local pharmacy or Health Dept. Aware to provide a copy of the vaccination record if obtained from local pharmacy or Health Dept. Verbalized acceptance and understanding.  Pneumococcal Vaccine: Does the patient want to receive this vaccine today?   Not Due . Education has been provided regarding the importance of this vaccine but still declined. Advised may receive this vaccine at local pharmacy or Health Dept. Aware to provide a copy of the vaccination record if obtained from local pharmacy or Health Dept. Verbalized acceptance and understanding.   Covid-19 Vaccine: Completed vaccines  Screening Tests Health Maintenance  Topic Date Due   Medicare Annual Wellness (AWV)  02/05/2022   COVID-19 Vaccine (4 - 2023-24 season) 02/21/2022 (Originally 11/01/2021)   Zoster Vaccines- Shingrix (1 of 2) 05/07/2022 (Originally [birth year]/1981)   Fecal DNA (Cologuard)  02/06/2023 (Originally 12/05/2021)   Lung Cancer Screening  04/26/2022    DTaP/Tdap/Td (2 - Td or Tdap) 11/25/2028   INFLUENZA VACCINE  Completed   Hepatitis C Screening  Completed   HIV Screening  Completed   HPV VACCINES  Aged Out   Cancer Screenings:  Colorectal Screening: Completed Cologuard 12/06/2018. Repeat every 3 years; Pt due  Lung Cancer Screening: (Low Dose CT Chest recommended if Age 9-80 years, 30 pack-year currently smoking OR have quit w/in 15years.) does qualify.   Lung Cancer Screening Referral: Has Already been placed  Additional Screening:  Hepatitis C Screening: does qualify; Completed 06/29/2017  Vision Screening: Recommended annual ophthalmology exams for early detection of glaucoma and other disorders of the eye. Is the patient up to date with their annual eye exam?  No  Who is the provider or what is the name of the office in which the pt attends annual eye exams? Declined   Dental Screening: Recommended annual dental exams for proper oral hygiene  Community Resource Referral:  CRR required this visit?  No        Plan:  I have personally reviewed and addressed the Medicare Annual Wellness  questionnaire and have noted the following in the patient's chart:  A. Medical and social history B. Use of alcohol, tobacco or illicit drugs  C. Current medications and supplements D. Functional ability and status E.  Nutritional status F.  Physical activity G. Advance directives H. List of other physicians I.  Hospitalizations, surgeries, and ER visits in previous 12 months J.  [place] such as hearing and vision if needed, cognitive and depression L. Referrals and appointments   In addition, I have reviewed and discussed with patient certain preventive protocols, quality metrics, and best practice recommendations. A written personalized care plan for preventive services as well as general preventive health recommendations were provided to patient.   Signed,   Talitha Givens, MHS, PA-C [facility]

## 2022-02-06 ENCOUNTER — Ambulatory Visit: Payer: Medicare Other

## 2022-02-12 ENCOUNTER — Encounter: Payer: Self-pay | Admitting: Internal Medicine

## 2022-02-12 ENCOUNTER — Ambulatory Visit: Payer: Medicare Other | Admitting: Internal Medicine

## 2022-02-12 VITALS — BP 136/80 | HR 72 | Temp 97.9°F | Ht 72.0 in | Wt 212.0 lb

## 2022-02-12 DIAGNOSIS — J849 Interstitial pulmonary disease, unspecified: Secondary | ICD-10-CM | POA: Diagnosis not present

## 2022-02-12 DIAGNOSIS — J449 Chronic obstructive pulmonary disease, unspecified: Secondary | ICD-10-CM | POA: Diagnosis not present

## 2022-02-12 DIAGNOSIS — R9389 Abnormal findings on diagnostic imaging of other specified body structures: Secondary | ICD-10-CM | POA: Diagnosis not present

## 2022-02-12 NOTE — Patient Instructions (Signed)
Obtain pulmonary function test to assess lung function  Continue inhalers as prescribed  Avoid secondhand smoke Avoid SICK contacts Recommend  Masking  when appropriate Recommend Keep up-to-date with vaccinations

## 2022-02-12 NOTE — Progress Notes (Signed)
Name: Ronald Ellis MRN: [id number] DOB: [birth year]     CONSULTATION DATE: 02/12/2022  REFERRING MD : Ancil Boozer   STUDIES:   12/21/2018 CT chest Independently reviewed by Me Bilateral upper lobe predominant emphysematous changes along with mild interstitial lung disease throughout the lungs Images reviewed with the patient    CHIEF COMPLAINT:  Follow up COPD Follow up Lung cancer protocol   HISTORY OF PRESENT ILLNESS:  No exacerbation at this time No evidence of heart failure at this time No evidence or signs of infection at this time No respiratory distress No fevers, chills, nausea, vomiting, diarrhea No evidence of lower extremity edema No evidence hemoptysis Breo to 2 puffs in a.m. 1 puff in p.m.     Enrolled in lung cancer screening program Last Ct Chest 04/2021   PAST MEDICAL HISTORY :   has a past medical history of Arthritis, Chronic pain, COPD (chronic obstructive pulmonary disease) ([place]), Depression (08/06/81), Gluten intolerance, Oxygen deficiency, and Pneumonia.  has a past surgical history that includes Back surgery (2015); Neck surgery (2016); Inguinal hernia repair (Bilateral); lumbar discetomy (2005); Back surgery; Mole removal; and Spine surgery (12/15/01). Prior to Admission medications   Medication Sig Start Date End Date Taking? Authorizing Provider  acetaminophen (TYLENOL) 500 MG tablet Take 500 mg by mouth every 6 (six) hours as needed.   Yes [provider]  albuterol (PROVENTIL HFA;VENTOLIN HFA) 108 (90 Base) MCG/ACT inhaler Inhale 2 puffs into the lungs every 6 (six) hours as needed for wheezing or shortness of breath. 06/29/17  Yes Sowles, Drue Stager, MD  ARIPiprazole (ABILIFY) 5 MG tablet Take 1 tablet (5 mg total) by mouth daily. 03/30/19  Yes Sowles, Drue Stager, MD  Ascorbic Acid (VITAMIN C PO) Take by mouth.   Yes [provider]  b complex vitamins tablet Take 1 tablet by mouth daily.   Yes [provider]  benzonatate  (TESSALON) 200 MG capsule Take 1 capsule (200 mg total) by mouth 3 (three) times daily as needed. 05/30/19  Yes Steele Sizer, MD  BIOTIN PO Take by mouth.   Yes [provider]  DULoxetine (CYMBALTA) 60 MG capsule Take 1 capsule (60 mg total) by mouth daily. 03/30/19  Yes Sowles, Drue Stager, MD  fluticasone (FLONASE) 50 MCG/ACT nasal spray Place 2 sprays into both nostrils daily. 03/30/19  Yes Sowles, Drue Stager, MD  hydrOXYzine (ATARAX/VISTARIL) 10 MG tablet Take 1 tablet (10 mg total) by mouth 3 (three) times daily as needed. 12/28/18  Yes Steele Sizer, MD  MAGNESIUM PO Take by mouth.   Yes [provider]  pregabalin (LYRICA) 100 MG capsule Take 1 capsule (100 mg total) by mouth 3 (three) times daily. 05/30/19  Yes Sowles, Drue Stager, MD  rizatriptan (MAXALT) 10 MG tablet Take 1 tablet (10 mg total) by mouth as needed for migraine. May repeat in 2 hours if needed 04/12/19  Yes Sowles, Drue Stager, MD  terbinafine (LAMISIL) 250 MG tablet Take 1 tablet (250 mg total) by mouth daily. 03/09/19  Yes Hyatt, Max T, DPM  tiotropium (SPIRIVA HANDIHALER) 18 MCG inhalation capsule Place 1 capsule (18 mcg total) into inhaler and inhale daily. 03/30/19  Yes Sowles, Drue Stager, MD  tiZANidine (ZANAFLEX) 4 MG tablet Take 1 tablet (4 mg total) by mouth at bedtime. 06/29/17  Yes Sowles, Drue Stager, MD  traZODone (DESYREL) 100 MG tablet Take 1 tablet (100 mg total) by mouth at bedtime. Pt taking 2 tablets qhs 04/27/19  Yes Sowles, Drue Stager, MD  fluticasone furoate-vilanterol (BREO ELLIPTA) 100-25 MCG/INH AEPB Inhale  1 puff into the lungs daily. Patient not taking: Reported on 06/21/2019 03/30/19   Steele Sizer, MD   Allergies  Allergen Reactions   Statins Other (See Comments)    Fever, chills, body aches   Gluten Meal     FAMILY HISTORY:  family history includes Hypertension in his brother and father; Lung cancer in his maternal grandfather; Lung disease in his father. SOCIAL HISTORY:  reports that he quit  smoking about 10 years ago. His smoking use included cigarettes. He started smoking about 45 years ago. He has a 42.00 pack-year smoking history. He has never used smokeless tobacco. He reports that he does not currently use alcohol. He reports that he does not currently use drugs after having used the following drugs: "Crack" cocaine, Cocaine, Heroin, Marijuana, and Other-see comments.   BP 136/80 (BP Location: Left Arm, Cuff Size: Normal)   Pulse 72   Temp 97.9 F (36.6 C) (Temporal)   Ht 6' (1.829 m)   Wt 212 lb (96.2 kg)   SpO2 96%   BMI 28.75 kg/m    Review of Systems: Gen:  Denies  fever, sweats, chills weight loss  HEENT: Denies blurred vision, double vision, ear pain, eye pain, hearing loss, nose bleeds, sore throat Cardiac:  No dizziness, chest pain or heaviness, chest tightness,edema, No JVD Resp:   No cough, -sputum production, -shortness of breath,-wheezing, -hemoptysis,  Other:  All other systems negative    Physical Examination:   General Appearance: No distress  EYES PERRLA, EOM intact.   NECK Supple, No JVD Pulmonary: normal breath sounds, No wheezing.  CardiovascularNormal S1,S2.  No m/r/g.   Abdomen: Benign, Soft, non-tender. ALL OTHER ROS ARE NEGATIVE        ASSESSMENT AND PLAN SYNOPSIS  61 year old pleasant white male seen today for assessment for COPD CT scan finding shows bilateral cystic and emphysematous changes along with honeycomb pattern in the mild form along the peripheral edges of his lung suggestive of underlying interstitial lung disease as well  ILD/COPD Recommend repeat PFT to assess lung function No exacerbation at this time Well managed with Symbicort daily, rinses out mouth after every use Advised to use albuterol as needed Advised to avoid allergens   Abnormal CT chest Follow up Lung cancer screening program   MEDICATION ADJUSTMENTS/LABS AND TESTS ORDERED: Continue inhalers as prescribed AVOID SECOND HAND SMOKE FOLLOW  UP CT CHEST FOR LUNG CANCER SCREENING EVERY YEAR Obtain PFT's   Patient satisfied with Plan of action and management. All questions answered  Follow up in 1 year  TOTAL TIME SPENT WITH PATIENT 25 mins  Sameen Leas Patricia Pesa, M.D.  [facility]  Medical Director [place] Director [facility] Cardio-Pulmonary Department

## 2022-03-06 DIAGNOSIS — M47896 Other spondylosis, lumbar region: Secondary | ICD-10-CM | POA: Diagnosis not present

## 2022-03-06 DIAGNOSIS — M5416 Radiculopathy, lumbar region: Secondary | ICD-10-CM | POA: Diagnosis not present

## 2022-03-06 DIAGNOSIS — M961 Postlaminectomy syndrome, not elsewhere classified: Secondary | ICD-10-CM | POA: Diagnosis not present

## 2022-03-06 DIAGNOSIS — M5136 Other intervertebral disc degeneration, lumbar region: Secondary | ICD-10-CM | POA: Diagnosis not present

## 2022-03-06 DIAGNOSIS — M545 Low back pain, unspecified: Secondary | ICD-10-CM | POA: Diagnosis not present

## 2022-03-12 DIAGNOSIS — M47817 Spondylosis without myelopathy or radiculopathy, lumbosacral region: Secondary | ICD-10-CM | POA: Diagnosis not present

## 2022-04-07 NOTE — Progress Notes (Unsigned)
Name: Ronald Ellis   MRN: [id number]    DOB: 1961/01/28   Date:04/08/2022       Progress Note  Subjective  Chief Complaint  UTI  HPI  Hesitancy: he stopped taking Flomax two weeks ago, he decided not to continue taking it since symptoms of incomplete void, weak flow  and interrupted flow did not improve with medication. However after he stopped he felt like his genitals shrunk in size. Today he thinks his genitalia is back in normal size. He states over the past week he has noticed he needs to strain to void and has dysuria , no hematuria or odor in urine. No fever or chills. , normal chronic back pain.   Patient Active Problem List   Diagnosis Date Noted   Emphysema lung ([place]) 08/12/2021   Moderate major depression ([place]) 05/03/2021   Nocturnal hypoxia 06/25/2020   Migraine without aura and without status migrainosus, not intractable 05/02/2020   Seasonal affective disorder ([place]) 05/02/2020   Chest pain of uncertain etiology 20/94/7096   Atherosclerosis of aorta ([place]) 12/17/2018   Personal history of tobacco use, presenting hazards to health 12/16/2018   History of drug dependence/abuse ([place]) 11/26/2018   Celiac disease 02/05/2018   Right-sided low back pain with right-sided sciatica 12/05/2015   Plantar fasciitis of right foot 10/15/2015   Chronic neck and back pain 07/19/2015    Past Surgical History:  Procedure Laterality Date   BACK SURGERY  2015   BACK SURGERY     INGUINAL HERNIA REPAIR Bilateral    multiple times   lumbar discetomy  2005   MOLE REMOVAL     NECK SURGERY  2016   SPINE SURGERY  12/15/01    Family History  Problem Relation Age of Onset   Hypertension Father    Lung disease Father    Hypertension Brother    Lung cancer Maternal Grandfather     Social History   Tobacco Use   Smoking status: Former    Packs/day: 1.00    Years: 42.00    Total pack years: 42.00    Types: Cigarettes    Start date: 71    Quit date: 12/13/2011    Years since  quitting: 10.3   Smokeless tobacco: Never  Substance Use Topics   Alcohol use: Not Currently    Comment: quit 5-6 years ago, only beer - but glutten free diet now     Current Outpatient Medications:    albuterol (VENTOLIN HFA) 108 (90 Base) MCG/ACT inhaler, Inhale 2 puffs into the lungs every 6 (six) hours as needed for wheezing or shortness of breath., Disp: 18 g, Rfl: 0   Ascorbic Acid (VITAMIN C PO), Take by mouth., Disp: , Rfl:    BIOTIN PO, Take by mouth., Disp: , Rfl:    DULoxetine (CYMBALTA) 60 MG capsule, Take 1 capsule (60 mg total) by mouth daily., Disp: 90 capsule, Rfl: 1   ezetimibe (ZETIA) 10 MG tablet, Take 1 tablet (10 mg total) by mouth daily., Disp: 90 tablet, Rfl: 3   hydrOXYzine (ATARAX) 10 MG tablet, TAKE 1 TABLET(10 MG) BY MOUTH THREE TIMES DAILY AS NEEDED, Disp: 90 tablet, Rfl: 1   L-Citrulline POWD, by Does not apply route daily., Disp: , Rfl:    MAGNESIUM PO, Take by mouth., Disp: , Rfl:    Misc Natural Products (MAGIC MUSHROOM MIX) CAPS, Take 2 capsules by mouth daily., Disp: , Rfl:    MORINGA OLEIFERA PO, Take 1,200 mg by mouth daily at 12  noon., Disp: , Rfl:    naproxen (NAPROSYN) 500 MG tablet, Take 500 mg by mouth 2 (two) times daily as needed., Disp: , Rfl:    pregabalin (LYRICA) 100 MG capsule, Take 1 capsule (100 mg total) by mouth 2 (two) times daily., Disp: 180 capsule, Rfl: 1   rizatriptan (MAXALT) 10 MG tablet, Take 1 tablet (10 mg total) by mouth as needed for migraine. May repeat in 2 hours if needed, Disp: 10 tablet, Rfl: 1   tiZANidine (ZANAFLEX) 4 MG tablet, Take 1 tablet (4 mg total) by mouth at bedtime., Disp: 90 tablet, Rfl: 1   TWYNEO 0.1-3 % CREA, Apply 1 application topically daily. PRN, Disp: , Rfl:    zinc gluconate 50 MG tablet, Take 50 mg by mouth daily., Disp: , Rfl:   Allergies  Allergen Reactions   Statins Other (See Comments)    Fever, chills, body aches   Gluten Meal     I personally reviewed active problem list, medication  list, allergies, family history, social history, health maintenance with the patient/caregiver today.   ROS  Ten systems reviewed and is negative except as mentioned in HPI   Objective  Vitals:   04/08/22 0928  BP: 120/82  Pulse: 84  Resp: 16  Temp: 97.6 F (36.4 C)  TempSrc: Oral  SpO2: 96%  Weight: 208 lb 6.4 oz (94.5 kg)  Height: 6' (1.829 m)    Body mass index is 28.26 kg/m.  Physical Exam  Constitutional: Patient appears well-developed and well-nourished.  No distress.  HEENT: head atraumatic, normocephalic, pupils equal and reactive to light, neck supple Cardiovascular: Normal rate, regular rhythm and normal heart sounds.  No murmur heard. No BLE edema. Pulmonary/Chest: Effort normal and breath sounds normal. No respiratory distress. Abdominal: Soft.  There is no tenderness. Psychiatric: Patient has a normal mood and affect. behavior is normal. Judgment and thought content normal.   Recent Results (from the past 2160 hour(s))  POCT Urinalysis Dipstick     Status: Normal   Collection Time: 04/08/22  9:28 AM  Result Value Ref Range   Color, UA Dark Yellow    Clarity, UA Clear    Glucose, UA Negative Negative   Bilirubin, UA Small    Ketones, UA Negative    Spec Grav, UA 1.025 1.010 - 1.025   Blood, UA Negative    pH, UA 5.0 5.0 - 8.0   Protein, UA Negative Negative   Urobilinogen, UA 0.2 0.2 or 1.0 E.U./dL   Nitrite, UA Negative    Leukocytes, UA Negative Negative   Appearance Dark    Odor Mal      PHQ2/9:    04/08/2022    9:29 AM 02/05/2022    9:23 AM 11/12/2021   10:40 AM 08/02/2021    1:05 PM 07/02/2021    2:32 PM  Depression screen PHQ 2/9  Decreased Interest 0 0 0 0 0  Down, Depressed, Hopeless 0 0 0 0 0  PHQ - 2 Score 0 0 0 0 0  Altered sleeping 0 0 0 0 0  Tired, decreased energy 0 0 0 0 0  Change in appetite 0 0 0 0 0  Feeling bad or failure about yourself  0 0 0 0 0  Trouble concentrating 0 0 0 0 0  Moving slowly or fidgety/restless 0 0 0 0  0  Suicidal thoughts 0 0 0 0 0  PHQ-9 Score 0 0 0 0 0  Difficult doing work/chores Not difficult at all Not  difficult at all  Not difficult at all Not difficult at all    phq 9 is negative   Fall Risk:    04/08/2022    9:29 AM 02/05/2022    9:23 AM 11/12/2021   10:39 AM 08/02/2021    1:05 PM 07/02/2021    2:32 PM  Fall Risk   Falls in the past year? 0 0 0 0 0  Number falls in past yr: 0 0 0 0 0  Injury with Fall? 0 0 0 0 0  Risk for fall due to : No Fall Risks No Fall Risks No Fall Risks    Follow up Falls prevention discussed;Education provided;Falls evaluation completed Falls prevention discussed;Education provided;Falls evaluation completed Falls prevention discussed        Functional Status Survey: Is the patient deaf or have difficulty hearing?: No Does the patient have difficulty seeing, even when wearing glasses/contacts?: No Does the patient have difficulty concentrating, remembering, or making decisions?: No Does the patient have difficulty walking or climbing stairs?: No Does the patient have difficulty dressing or bathing?: No Does the patient have difficulty doing errands alone such as visiting a doctor's office or shopping?: No    Assessment & Plan  1. Dysuria  - POCT Urinalysis Dipstick - CULTURE, URINE COMPREHENSIVE - doxycycline (VIBRA-TABS) 100 MG tablet; Take 1 tablet (100 mg total) by mouth 2 (two) times daily.  Dispense: 28 tablet; Refill: 0  2. Hesitancy of micturition  - doxycycline (VIBRA-TABS) 100 MG tablet; Take 1 tablet (100 mg total) by mouth 2 (two) times daily.  Dispense: 28 tablet; Refill: 0  3. Lower urinary tract symptoms (LUTS)  - doxycycline (VIBRA-TABS) 100 MG tablet; Take 1 tablet (100 mg total) by mouth 2 (two) times daily.  Dispense: 28 tablet; Refill: 0

## 2022-04-08 ENCOUNTER — Ambulatory Visit (INDEPENDENT_AMBULATORY_CARE_PROVIDER_SITE_OTHER): Payer: Medicare Other | Admitting: Family Medicine

## 2022-04-08 ENCOUNTER — Encounter: Payer: Self-pay | Admitting: Family Medicine

## 2022-04-08 VITALS — BP 120/82 | HR 84 | Temp 97.6°F | Resp 16 | Ht 72.0 in | Wt 208.4 lb

## 2022-04-08 DIAGNOSIS — R399 Unspecified symptoms and signs involving the genitourinary system: Secondary | ICD-10-CM | POA: Diagnosis not present

## 2022-04-08 DIAGNOSIS — R3911 Hesitancy of micturition: Secondary | ICD-10-CM | POA: Diagnosis not present

## 2022-04-08 DIAGNOSIS — R300 Dysuria: Secondary | ICD-10-CM | POA: Diagnosis not present

## 2022-04-08 LAB — POCT URINALYSIS DIPSTICK
Blood, UA: NEGATIVE
Glucose, UA: NEGATIVE
Ketones, UA: NEGATIVE
Leukocytes, UA: NEGATIVE
Nitrite, UA: NEGATIVE
Protein, UA: NEGATIVE
Spec Grav, UA: 1.025 (ref 1.010–1.025)
Urobilinogen, UA: 0.2 E.U./dL
pH, UA: 5 (ref 5.0–8.0)

## 2022-04-08 MED ORDER — DOXYCYCLINE HYCLATE 100 MG PO TABS: 100.0000 mg | ORAL_TABLET | Freq: Two times a day (BID) | ORAL | 0 refills | Status: DC

## 2022-04-10 LAB — CULTURE, URINE COMPREHENSIVE
MICRO NUMBER:: 14525865
RESULT:: NO GROWTH
SPECIMEN QUALITY:: ADEQUATE

## 2022-04-28 ENCOUNTER — Ambulatory Visit: Payer: Medicare Other

## 2022-05-01 ENCOUNTER — Ambulatory Visit
Admission: RE | Admit: 2022-05-01 | Discharge: 2022-05-01 | Disposition: A | Discharge status: Home / Self Care | Payer: Medicare Other | Source: Ambulatory Visit | Attending: Acute Care | Admitting: Acute Care

## 2022-05-01 DIAGNOSIS — Z87891 Personal history of nicotine dependence: Secondary | ICD-10-CM | POA: Insufficient documentation

## 2022-05-05 ENCOUNTER — Other Ambulatory Visit: Payer: Self-pay

## 2022-05-05 DIAGNOSIS — Z87891 Personal history of nicotine dependence: Secondary | ICD-10-CM

## 2022-05-05 DIAGNOSIS — Z122 Encounter for screening for malignant neoplasm of respiratory organs: Secondary | ICD-10-CM

## 2022-05-12 NOTE — Progress Notes (Unsigned)
Name: Ronald Ellis   MRN: [id number]    DOB: December 29, 1960   Date:05/13/2022       Progress Note  Subjective  Chief Complaint  Follow Up  HPI  Intermittent chest pain: seen by Dr. Saunders Revel,, seems to be related to episodes of bronchitis - feels tight, he has sob but likely from emphysema he stopped taking Atorvastatin because it caused muscle aches  but is taking zetia now   Stress Myoview done 08/05/2019 :     There was no ST segment deviation noted during stress.   No T wave inversion was noted during stress.   The study is normal.   This is a low risk study.   The left ventricular ejection fraction is normal (55-65%).   Suboptimal study due to GI.    Echocardiogram done 08/04/2019  1. Left ventricular ejection fraction, by estimation, is 65 to 70%. The left ventricle has normal function. The left ventricle has no regional wall motion abnormalities. Left ventricular diastolic parameters were normal. 2. Right ventricular systolic function is normal. The right ventricular size is normal. There is normal pulmonary artery systolic pressure. 3. The mitral valve is normal in structure. No evidence of mitral valve regurgitation. No evidence of mitral stenosis. 4. The aortic valve is normal in structure. Aortic valve regurgitation is not visualized. No aortic stenosis is present. 5. The inferior vena cava is normal in size with greater than 50% respiratory variability, suggesting right atrial pressure of 3 mmHg    Emphysema: he smoked cigarettes for about 40 years one pack daily but quit in 2013 , he quit smoking cigars in 2020.Marland Kitchen He was seen by Dr. Mortimer Fries. He is returned his oxygen tanks, he also only takes Breo prn, he had COVID a few weeks ago and had more coughing and had to use it again. He has a baseline cough, but no wheezing , sob only moderate activity   IMPRESSION:05/01/2022  1. Lung-RADS 2, benign appearance or behavior. Continue annual screening with low-dose chest CT without  contrast in 12 months. 2. Basilar predominant subpleural coarsened ground-glass, subpleural reticulation and traction bronchiectasis/bronchiolectasis, similar to minimally progressive from 04/26/2021. Findings may be due to usual interstitial pneumonitis or fibrotic nonspecific interstitial pneumonitis. 3.  Emphysema (ICD10-J43.9).  Melanoma in situ 2021 : seeing Dr. Phillip Heal, removed and had Mohl't surgery on his neck . He is up to date with follow ups, still going every 6 months    Headache: improved, however still takes Advil in am's for body aches. He states migraine headaches back to about once a week and resolves when he takes maxalt - but can last up to 6 hours  it was associated with nausea, throbbing pain, behind right eye and frontal area, he states pain causes vomiting at times, he states maxalt decreases the intensity down to a minimum and he is able to function. He thinks secondary to DDD of cervical spine, he took topamax in the past but not sure why he stopped Episodes down to about at most twice a month    Chronic neck and back pain: . He is still seeing Ortho, had some steroid injections by Dr. Angelica Chessman takes medication prn, he had an ablation on lumbar spine and the pain on his back is not as bad but still has radiculitis . Pain on her back is 5-6/10     MDD/seasonal affective disorder: he decided to stop Abilify months ago and is doing well on duloxetine and hdyroxizine prn, usually in am's he stopped  trazodone but sleeps well at night. His anger is under control. Stable   History of drug use: when he was an young adult, he got married at age 14 , had 2 kids, when he got a divorce at age 63 , he went wild , he did drugs until age 65, he states the reason he quit was because he did a big snort of cocaine, he fell forward and hit his head , he had a laceration and decided not to do it again. He has been doing well, still in remission  Unchanged   Atherosclerosis of aorta: he tried  atorvastatin but caused body aches, also failed Crestor, he is taking Zetia now and tolerating it well, we will recheck labs today   Other Fatigue: he is worried about low testosterone, he is not sexually active - wife has vaginal dryness   Patient Active Problem List   Diagnosis Date Noted   Emphysema lung ([place]) 08/12/2021   Moderate major depression ([place]) 05/03/2021   Nocturnal hypoxia 06/25/2020   Migraine without aura and without status migrainosus, not intractable 05/02/2020   Seasonal affective disorder ([place]) 05/02/2020   Chest pain of uncertain etiology [id number]   Atherosclerosis of aorta ([place]) 12/17/2018   Personal history of tobacco use, presenting hazards to health 12/16/2018   History of drug dependence/abuse ([place]) 11/26/2018   Celiac disease 02/05/2018   Right-sided low back pain with right-sided sciatica 12/05/2015   Plantar fasciitis of right foot 10/15/2015   Chronic neck and back pain 07/19/2015    Past Surgical History:  Procedure Laterality Date   BACK SURGERY  2015   BACK SURGERY     INGUINAL HERNIA REPAIR Bilateral    multiple times   lumbar discetomy  2005   MOLE REMOVAL     NECK SURGERY  2016   SPINE SURGERY  12/15/01    Family History  Problem Relation Age of Onset   Hypertension Father    Lung disease Father    Hypertension Brother    Lung cancer Maternal Grandfather     Social History   Tobacco Use   Smoking status: Former    Packs/day: 1.00    Years: 42.00    Total pack years: 42.00    Types: Cigarettes    Start date: 59    Quit date: 12/13/2011    Years since quitting: 10.4   Smokeless tobacco: Never  Substance Use Topics   Alcohol use: Not Currently    Comment: quit 5-6 years ago, only beer - but glutten free diet now     Current Outpatient Medications:    albuterol (VENTOLIN HFA) 108 (90 Base) MCG/ACT inhaler, Inhale 2 puffs into the lungs every 6 (six) hours as needed for wheezing or shortness of breath., Disp: 18 g, Rfl:  0   Ascorbic Acid (VITAMIN C PO), Take by mouth., Disp: , Rfl:    BIOTIN PO, Take by mouth., Disp: , Rfl:    L-Citrulline POWD, by Does not apply route daily., Disp: , Rfl:    MAGNESIUM PO, Take by mouth., Disp: , Rfl:    Misc Natural Products (MAGIC MUSHROOM MIX) CAPS, Take 2 capsules by mouth daily., Disp: , Rfl:    MORINGA OLEIFERA PO, Take 1,200 mg by mouth daily at 12 noon., Disp: , Rfl:    naproxen (NAPROSYN) 500 MG tablet, Take 500 mg by mouth 2 (two) times daily as needed., Disp: , Rfl:    pregabalin (LYRICA) 100 MG capsule, Take 1 capsule (100  mg total) by mouth 2 (two) times daily., Disp: 180 capsule, Rfl: 1   rizatriptan (MAXALT) 10 MG tablet, Take 1 tablet (10 mg total) by mouth as needed for migraine. May repeat in 2 hours if needed, Disp: 10 tablet, Rfl: 1   tiZANidine (ZANAFLEX) 4 MG tablet, Take 1 tablet (4 mg total) by mouth at bedtime., Disp: 90 tablet, Rfl: 1   TWYNEO 0.1-3 % CREA, Apply 1 application topically daily. PRN, Disp: , Rfl:    zinc gluconate 50 MG tablet, Take 50 mg by mouth daily., Disp: , Rfl:    DULoxetine (CYMBALTA) 60 MG capsule, Take 1 capsule (60 mg total) by mouth daily., Disp: 90 capsule, Rfl: 1   ezetimibe (ZETIA) 10 MG tablet, Take 1 tablet (10 mg total) by mouth daily., Disp: 90 tablet, Rfl: 3   hydrOXYzine (ATARAX) 10 MG tablet, TAKE 1 TABLET(10 MG) BY MOUTH THREE TIMES DAILY AS NEEDED, Disp: 90 tablet, Rfl: 1  Allergies  Allergen Reactions   Statins Other (See Comments)    Fever, chills, body aches   Gluten Meal     I personally reviewed active problem list, medication list, allergies, family history, social history, health maintenance with the patient/caregiver today.   ROS  Ten systems reviewed and is negative except as mentioned in HPI   Objective  Vitals:   05/13/22 1112  BP: 120/78  Pulse: 95  Resp: 16  Temp: 97.8 F (36.6 C)  TempSrc: Oral  SpO2: 96%  Weight: 205 lb 1.6 oz (93 kg)  Height: 6' (1.829 m)    Body mass index  is 27.82 kg/m.  Physical Exam  Constitutional: Patient appears well-developed and well-nourished.  No distress.  HEENT: head atraumatic, normocephalic, pupils equal and reactive to light, neck supple Cardiovascular: Normal rate, regular rhythm and normal heart sounds.  No murmur heard. No BLE edema. Pulmonary/Chest: Effort normal , coarse inspiratory sounds  No respiratory distress. Abdominal: Soft.  There is no tenderness. Psychiatric: Patient has a normal mood and affect. behavior is normal. Judgment and thought content normal.   Recent Results (from the past 2160 hour(s))  POCT Urinalysis Dipstick     Status: Normal   Collection Time: 04/08/22  9:28 AM  Result Value Ref Range   Color, UA Dark Yellow    Clarity, UA Clear    Glucose, UA Negative Negative   Bilirubin, UA Small    Ketones, UA Negative    Spec Grav, UA 1.025 1.010 - 1.025   Blood, UA Negative    pH, UA 5.0 5.0 - 8.0   Protein, UA Negative Negative   Urobilinogen, UA 0.2 0.2 or 1.0 E.U./dL   Nitrite, UA Negative    Leukocytes, UA Negative Negative   Appearance Dark    Odor Mal   CULTURE, URINE COMPREHENSIVE     Status: None   Collection Time: 04/08/22 12:02 PM   Specimen: Urine  Result Value Ref Range   MICRO NUMBER: [id number]    SPECIMEN QUALITY: Adequate    Source OTHER (SPECIFY)    STATUS: FINAL    RESULT: No Growth     PHQ2/9:    05/13/2022   11:17 AM 04/08/2022    9:29 AM 02/05/2022    9:23 AM 11/12/2021   10:40 AM 08/02/2021    1:05 PM  Depression screen PHQ 2/9  Decreased Interest 2 0 0 0 0  Down, Depressed, Hopeless 1 0 0 0 0  PHQ - 2 Score 3 0 0 0 0  Altered  sleeping 1 0 0 0 0  Tired, decreased energy 1 0 0 0 0  Change in appetite 1 0 0 0 0  Feeling bad or failure about yourself  0 0 0 0 0  Trouble concentrating 0 0 0 0 0  Moving slowly or fidgety/restless 0 0 0 0 0  Suicidal thoughts 0 0 0 0 0  PHQ-9 Score 6 0 0 0 0  Difficult doing work/chores Not difficult at all Not difficult at all Not  difficult at all  Not difficult at all    phq 9 is positive   Fall Risk:    05/13/2022   11:17 AM 04/08/2022    9:29 AM 02/05/2022    9:23 AM 11/12/2021   10:39 AM 08/02/2021    1:05 PM  Fall Risk   Falls in the past year? 0 0 0 0 0  Number falls in past yr:  0 0 0 0  Injury with Fall?  0 0 0 0  Risk for fall due to : No Fall Risks No Fall Risks No Fall Risks No Fall Risks   Follow up Falls prevention discussed Falls prevention discussed;Education provided;Falls evaluation completed Falls prevention discussed;Education provided;Falls evaluation completed Falls prevention discussed       Functional Status Survey: Is the patient deaf or have difficulty hearing?: No Does the patient have difficulty seeing, even when wearing glasses/contacts?: No Does the patient have difficulty concentrating, remembering, or making decisions?: No Does the patient have difficulty walking or climbing stairs?: No Does the patient have difficulty dressing or bathing?: No Does the patient have difficulty doing errands alone such as visiting a doctor's office or shopping?: No    Assessment & Plan  1. Moderate major depression ([facility])  - hydrOXYzine (ATARAX) 10 MG tablet; TAKE 1 TABLET(10 MG) BY MOUTH THREE TIMES DAILY AS NEEDED  Dispense: 90 tablet; Refill: 1 - DULoxetine (CYMBALTA) 60 MG capsule; Take 1 capsule (60 mg total) by mouth daily.  Dispense: 90 capsule; Refill: 1  2. Centrilobular emphysema ([place])  Recent COVID - doing better now   3. Atherosclerosis of aorta ([facility])  - Lipid panel  4. History of drug dependence/abuse ([facility])  Stable  5. Chronic neck and back pain  - DULoxetine (CYMBALTA) 60 MG capsule; Take 1 capsule (60 mg total) by mouth daily.  Dispense: 90 capsule; Refill: 1  6. Dyslipidemia  - ezetimibe (ZETIA) 10 MG tablet; Take 1 tablet (10 mg total) by mouth daily.  Dispense: 90 tablet; Refill: 3 - Lipid panel  7. RLS (restless legs syndrome)  - CBC with  Differential/Platelet  8. Migraine without aura and with status migrainosus, not intractable  No recent episodes   9. Elevated hematocrit  - CBC with Differential/Platelet  10. Other fatigue  - Testosterone,Free and Total  11. Long-term use of high-risk medication  - COMPLETE METABOLIC PANEL WITH GFR

## 2022-05-13 ENCOUNTER — Encounter: Payer: Self-pay | Admitting: Family Medicine

## 2022-05-13 ENCOUNTER — Ambulatory Visit (INDEPENDENT_AMBULATORY_CARE_PROVIDER_SITE_OTHER): Payer: Medicare Other | Admitting: Family Medicine

## 2022-05-13 VITALS — BP 120/78 | HR 95 | Temp 97.8°F | Resp 16 | Ht 72.0 in | Wt 205.1 lb

## 2022-05-13 DIAGNOSIS — J432 Centrilobular emphysema: Secondary | ICD-10-CM

## 2022-05-13 DIAGNOSIS — I700 Atherosclerosis of aorta: Secondary | ICD-10-CM | POA: Diagnosis not present

## 2022-05-13 DIAGNOSIS — G43001 Migraine without aura, not intractable, with status migrainosus: Secondary | ICD-10-CM | POA: Diagnosis not present

## 2022-05-13 DIAGNOSIS — E785 Hyperlipidemia, unspecified: Secondary | ICD-10-CM

## 2022-05-13 DIAGNOSIS — R5383 Other fatigue: Secondary | ICD-10-CM

## 2022-05-13 DIAGNOSIS — F1921 Other psychoactive substance dependence, in remission: Secondary | ICD-10-CM

## 2022-05-13 DIAGNOSIS — M549 Dorsalgia, unspecified: Secondary | ICD-10-CM

## 2022-05-13 DIAGNOSIS — G2581 Restless legs syndrome: Secondary | ICD-10-CM | POA: Diagnosis not present

## 2022-05-13 DIAGNOSIS — R718 Other abnormality of red blood cells: Secondary | ICD-10-CM | POA: Diagnosis not present

## 2022-05-13 DIAGNOSIS — Z79899 Other long term (current) drug therapy: Secondary | ICD-10-CM | POA: Diagnosis not present

## 2022-05-13 DIAGNOSIS — F321 Major depressive disorder, single episode, moderate: Secondary | ICD-10-CM | POA: Diagnosis not present

## 2022-05-13 DIAGNOSIS — G8929 Other chronic pain: Secondary | ICD-10-CM

## 2022-05-13 DIAGNOSIS — M542 Cervicalgia: Secondary | ICD-10-CM | POA: Diagnosis not present

## 2022-05-13 MED ORDER — EZETIMIBE 10 MG PO TABS: 10.0000 mg | ORAL_TABLET | Freq: Every day | ORAL | 3 refills | Status: DC

## 2022-05-13 MED ORDER — DULOXETINE HCL 60 MG PO CPEP: 60.0000 mg | ORAL_CAPSULE | Freq: Every day | ORAL | 1 refills | Status: DC

## 2022-05-13 MED ORDER — HYDROXYZINE HCL 10 MG PO TABS: ORAL_TABLET | ORAL | 1 refills | Status: DC

## 2022-05-17 LAB — CBC WITH DIFFERENTIAL/PLATELET
Absolute Monocytes: 605 cells/uL (ref 200–950)
Basophils Absolute: 59 cells/uL (ref 0–200)
Basophils Relative: 0.9 %
Eosinophils Absolute: 117 cells/uL (ref 15–500)
Eosinophils Relative: 1.8 %
HCT: 48.5 % (ref 38.5–50.0)
Hemoglobin: 16.7 g/dL (ref 13.2–17.1)
Lymphs Abs: 1879 cells/uL (ref 850–3900)
MCH: 34.2 pg — ABNORMAL HIGH (ref 27.0–33.0)
MCHC: 34.4 g/dL (ref 32.0–36.0)
MCV: 99.2 fL (ref 80.0–100.0)
MPV: 10 fL (ref 7.5–12.5)
Monocytes Relative: 9.3 %
Neutro Abs: 3842 cells/uL (ref 1500–7800)
Neutrophils Relative %: 59.1 %
Platelets: 311 10*3/uL (ref 140–400)
RBC: 4.89 10*6/uL (ref 4.20–5.80)
RDW: 12.5 % (ref 11.0–15.0)
Total Lymphocyte: 28.9 %
WBC: 6.5 10*3/uL (ref 3.8–10.8)

## 2022-05-17 LAB — LIPID PANEL
Cholesterol: 198 mg/dL (ref <200)
HDL: 44 mg/dL (ref >=40)
LDL Cholesterol (Calc): 135 mg/dL (calc) — ABNORMAL HIGH
Non-HDL Cholesterol (Calc): 154 mg/dL (calc) — ABNORMAL HIGH (ref <130)
Total CHOL/HDL Ratio: 4.5 (calc) (ref <5.0)
Triglycerides: 89 mg/dL (ref <150)

## 2022-05-17 LAB — TESTOSTERONE, FREE & TOTAL
Free Testosterone: 35.4 pg/mL (ref 35.0–155.0)
Testosterone, Total, LC-MS-MS: 361 ng/dL (ref 250–1100)

## 2022-05-17 LAB — COMPLETE METABOLIC PANEL WITH GFR
AG Ratio: 1.6 (calc) (ref 1.0–2.5)
ALT: 32 U/L (ref 9–46)
AST: 22 U/L (ref 10–35)
Albumin: 4.4 g/dL (ref 3.6–5.1)
Alkaline phosphatase (APISO): 73 U/L (ref 35–144)
BUN: 8 mg/dL (ref 7–25)
CO2: 24 mmol/L (ref 20–32)
Calcium: 9.8 mg/dL (ref 8.6–10.3)
Chloride: 104 mmol/L (ref 98–110)
Creat: 0.92 mg/dL (ref 0.70–1.35)
Globulin: 2.7 g/dL (calc) (ref 1.9–3.7)
Glucose, Bld: 80 mg/dL (ref 65–99)
Potassium: 4.6 mmol/L (ref 3.5–5.3)
Sodium: 142 mmol/L (ref 135–146)
Total Bilirubin: 1.3 mg/dL — ABNORMAL HIGH (ref 0.2–1.2)
Total Protein: 7.1 g/dL (ref 6.1–8.1)
eGFR: 95 mL/min/{1.73_m2} (ref >=60)

## 2022-05-20 ENCOUNTER — Other Ambulatory Visit: Payer: Self-pay | Admitting: Family Medicine

## 2022-05-20 DIAGNOSIS — R399 Unspecified symptoms and signs involving the genitourinary system: Secondary | ICD-10-CM

## 2022-05-20 DIAGNOSIS — R300 Dysuria: Secondary | ICD-10-CM

## 2022-05-20 DIAGNOSIS — R3911 Hesitancy of micturition: Secondary | ICD-10-CM

## 2022-05-22 DIAGNOSIS — M961 Postlaminectomy syndrome, not elsewhere classified: Secondary | ICD-10-CM | POA: Diagnosis not present

## 2022-05-22 DIAGNOSIS — M47896 Other spondylosis, lumbar region: Secondary | ICD-10-CM | POA: Diagnosis not present

## 2022-05-22 DIAGNOSIS — M5136 Other intervertebral disc degeneration, lumbar region: Secondary | ICD-10-CM | POA: Diagnosis not present

## 2022-05-22 DIAGNOSIS — M5416 Radiculopathy, lumbar region: Secondary | ICD-10-CM | POA: Diagnosis not present

## 2022-05-27 DIAGNOSIS — M5416 Radiculopathy, lumbar region: Secondary | ICD-10-CM | POA: Diagnosis not present

## 2022-05-29 DIAGNOSIS — M5451 Vertebrogenic low back pain: Secondary | ICD-10-CM | POA: Diagnosis not present

## 2022-05-29 DIAGNOSIS — M5416 Radiculopathy, lumbar region: Secondary | ICD-10-CM | POA: Diagnosis not present

## 2022-06-06 DIAGNOSIS — M533 Sacrococcygeal disorders, not elsewhere classified: Secondary | ICD-10-CM | POA: Diagnosis not present

## 2022-07-02 DIAGNOSIS — M533 Sacrococcygeal disorders, not elsewhere classified: Secondary | ICD-10-CM | POA: Diagnosis not present

## 2022-07-04 NOTE — Progress Notes (Unsigned)
Name: Ronald Ellis   MRN: [id number]    DOB: Mar 13, 1960   Date:07/07/2022       Progress Note  Subjective  Chief Complaint  No Energy  HPI  MDD/seasonal affective disorder: he used to take Abilify and Duloxetine 60 mg and Trazodone for sleep , plus hydroxizine prn, however he started to feel less angry and mood improved, he gradually weaned self off Abilify and stopped Trazodone since sleep improved, he is currently only taking 40 mg of Duloxetine. He states he would like to take testosterone since his fatigue has increased and his testosterone level was towards low end of normal. Discussed risk of increasing his LDL, strokes,  prostate cancer ,heart disease and also may cause increase in aggression and libido. Discussed trying wellbutrin plus higher dose of duloxetine instead. He would prefer testosterone first , aware of side effects and will increase dose of duloxetine back to 60 mg , he will return in one month to see how he responds to regiment change  Hypogonadism: see above  Patient Active Problem List   Diagnosis Date Noted   Emphysema lung (HCC) 08/12/2021   Moderate major depression (HCC) 05/03/2021   Nocturnal hypoxia 06/25/2020   Migraine without aura and without status migrainosus, not intractable 05/02/2020   Seasonal affective disorder (HCC) 05/02/2020   Chest pain of uncertain etiology 06/24/2019   Atherosclerosis of aorta (HCC) 12/17/2018   Personal history of tobacco use, presenting hazards to health 12/16/2018   History of drug dependence/abuse (HCC) 11/26/2018   Celiac disease 02/05/2018   Right-sided low back pain with right-sided sciatica 12/05/2015   Plantar fasciitis of right foot 10/15/2015   Chronic neck and back pain 07/19/2015    Past Surgical History:  Procedure Laterality Date   BACK SURGERY  2015   BACK SURGERY     INGUINAL HERNIA REPAIR Bilateral    multiple times   lumbar discetomy  2005   MOLE REMOVAL     NECK SURGERY  2016   SPINE SURGERY   12/15/01    Family History  Problem Relation Age of Onset   Hypertension Father    Lung disease Father    Hypertension Brother    Lung cancer Maternal Grandfather     Social History   Tobacco Use   Smoking status: Former    Packs/day: 1.00    Years: 42.00    Additional pack years: 0.00    Total pack years: 42.00    Types: Cigarettes    Start date: 66    Quit date: 12/13/2011    Years since quitting: 10.5   Smokeless tobacco: Never  Substance Use Topics   Alcohol use: Not Currently    Comment: quit 5-6 years ago, only beer - but glutten free diet now     Current Outpatient Medications:    albuterol (VENTOLIN HFA) 108 (90 Base) MCG/ACT inhaler, Inhale 2 puffs into the lungs every 6 (six) hours as needed for wheezing or shortness of breath., Disp: 18 g, Rfl: 0   Ascorbic Acid (VITAMIN C PO), Take by mouth., Disp: , Rfl:    DULoxetine (CYMBALTA) 60 MG capsule, Take 1 capsule (60 mg total) by mouth daily. (Patient taking differently: Take 40 mg by mouth daily.), Disp: 90 capsule, Rfl: 1   ezetimibe (ZETIA) 10 MG tablet, Take 1 tablet (10 mg total) by mouth daily., Disp: 90 tablet, Rfl: 3   hydrOXYzine (ATARAX) 10 MG tablet, TAKE 1 TABLET(10 MG) BY MOUTH THREE TIMES DAILY AS NEEDED, Disp:  90 tablet, Rfl: 1   L-Citrulline POWD, by Does not apply route daily., Disp: , Rfl:    MAGNESIUM PO, Take by mouth., Disp: , Rfl:    Misc Natural Products (MAGIC MUSHROOM MIX) CAPS, Take 2 capsules by mouth daily., Disp: , Rfl:    MORINGA OLEIFERA PO, Take 1,200 mg by mouth daily at 12 noon., Disp: , Rfl:    naproxen (NAPROSYN) 500 MG tablet, Take 500 mg by mouth 2 (two) times daily as needed., Disp: , Rfl:    pregabalin (LYRICA) 100 MG capsule, Take 1 capsule (100 mg total) by mouth 2 (two) times daily., Disp: 180 capsule, Rfl: 1   rizatriptan (MAXALT) 10 MG tablet, Take 1 tablet (10 mg total) by mouth as needed for migraine. May repeat in 2 hours if needed, Disp: 10 tablet, Rfl: 1    tiZANidine (ZANAFLEX) 4 MG tablet, Take 1 tablet (4 mg total) by mouth at bedtime., Disp: 90 tablet, Rfl: 1   TWYNEO 0.1-3 % CREA, Apply 1 application topically daily. PRN, Disp: , Rfl:    zinc gluconate 50 MG tablet, Take 50 mg by mouth daily., Disp: , Rfl:   Allergies  Allergen Reactions   Statins Other (See Comments)    Fever, chills, body aches   Gluten Meal     I personally reviewed active problem list, medication list, allergies, family history, social history, health maintenance with the patient/caregiver today.   ROS  Ten systems reviewed and is negative except as mentioned in HPI   Objective  Vitals:   07/07/22 1405  BP: 122/74  Pulse: 91  Resp: 16  SpO2: 98%  Weight: 205 lb (93 kg)  Height: 6' (1.829 m)    Body mass index is 27.8 kg/m.  Physical Exam  Constitutional: Patient appears well-developed and well-nourished. No distress.  HEENT: head atraumatic, normocephalic, pupils equal and reactive to light, neck supple Cardiovascular: Normal rate, regular rhythm and normal heart sounds.  No murmur heard. No BLE edema. Pulmonary/Chest: Effort normal and breath sounds normal. No respiratory distress. Abdominal: Soft.  There is no tenderness. Psychiatric: Patient has a normal mood and affect. behavior is normal. Judgment and thought content normal   PHQ2/9:    07/07/2022    2:00 PM 05/13/2022   11:17 AM 04/08/2022    9:29 AM 02/05/2022    9:23 AM 11/12/2021   10:40 AM  Depression screen PHQ 2/9  Decreased Interest 1 2 0 0 0  Down, Depressed, Hopeless 1 1 0 0 0  PHQ - 2 Score 2 3 0 0 0  Altered sleeping 1 1 0 0 0  Tired, decreased energy 3 1 0 0 0  Change in appetite 0 1 0 0 0  Feeling bad or failure about yourself  0 0 0 0 0  Trouble concentrating 0 0 0 0 0  Moving slowly or fidgety/restless 0 0 0 0 0  Suicidal thoughts 0 0 0 0 0  PHQ-9 Score 6 6 0 0 0  Difficult doing work/chores  Not difficult at all Not difficult at all Not difficult at all     phq 9  is positive   Fall Risk:    07/07/2022    1:59 PM 05/13/2022   11:17 AM 04/08/2022    9:29 AM 02/05/2022    9:23 AM 11/12/2021   10:39 AM  Fall Risk   Falls in the past year? 0 0 0 0 0  Number falls in past yr: 0  0 0 0  Injury with Fall? 0  0 0 0  Risk for fall due to : No Fall Risks No Fall Risks No Fall Risks No Fall Risks No Fall Risks  Follow up Falls prevention discussed Falls prevention discussed Falls prevention discussed;Education provided;Falls evaluation completed Falls prevention discussed;Education provided;Falls evaluation completed Falls prevention discussed      Functional Status Survey: Is the patient deaf or have difficulty hearing?: No Does the patient have difficulty seeing, even when wearing glasses/contacts?: No Does the patient have difficulty concentrating, remembering, or making decisions?: No Does the patient have difficulty walking or climbing stairs?: Yes Does the patient have difficulty dressing or bathing?: No Does the patient have difficulty doing errands alone such as visiting a doctor's office or shopping?: No    Assessment & Plan  1. Moderate major depression (HCC)  - DULoxetine (CYMBALTA) 60 MG capsule; Take 1 capsule (60 mg total) by mouth daily.  Dispense: 90 capsule; Refill: 0  2. Low testosterone in male  - testosterone (ANDROGEL) 50 MG/5GM (1%) GEL; Place 5 g onto the skin daily.  Dispense: 90 g; Refill: 0

## 2022-07-07 ENCOUNTER — Ambulatory Visit (INDEPENDENT_AMBULATORY_CARE_PROVIDER_SITE_OTHER): Payer: Medicare Other | Admitting: Family Medicine

## 2022-07-07 ENCOUNTER — Encounter: Payer: Self-pay | Admitting: Family Medicine

## 2022-07-07 VITALS — BP 122/74 | HR 91 | Resp 16 | Ht 72.0 in | Wt 205.0 lb

## 2022-07-07 DIAGNOSIS — F321 Major depressive disorder, single episode, moderate: Secondary | ICD-10-CM

## 2022-07-07 DIAGNOSIS — R7989 Other specified abnormal findings of blood chemistry: Secondary | ICD-10-CM | POA: Diagnosis not present

## 2022-07-07 MED ORDER — DULOXETINE HCL 60 MG PO CPEP: 60.0000 mg | ORAL_CAPSULE | Freq: Every day | ORAL | 0 refills | Status: DC

## 2022-07-07 MED ORDER — TESTOSTERONE 50 MG/5GM (1%) TD GEL: 5.0000 g | Freq: Every day | TRANSDERMAL | 0 refills | Status: DC

## 2022-07-12 IMAGING — DX DG CHEST 2V
2 series · 2 of 2 positions shown · non-contrast
Comparison: Chest radiograph dated 06/29/2017

CLINICAL DATA: 59-year-old male with cough

EXAM:
CHEST - 2 VIEW

[chest pa]
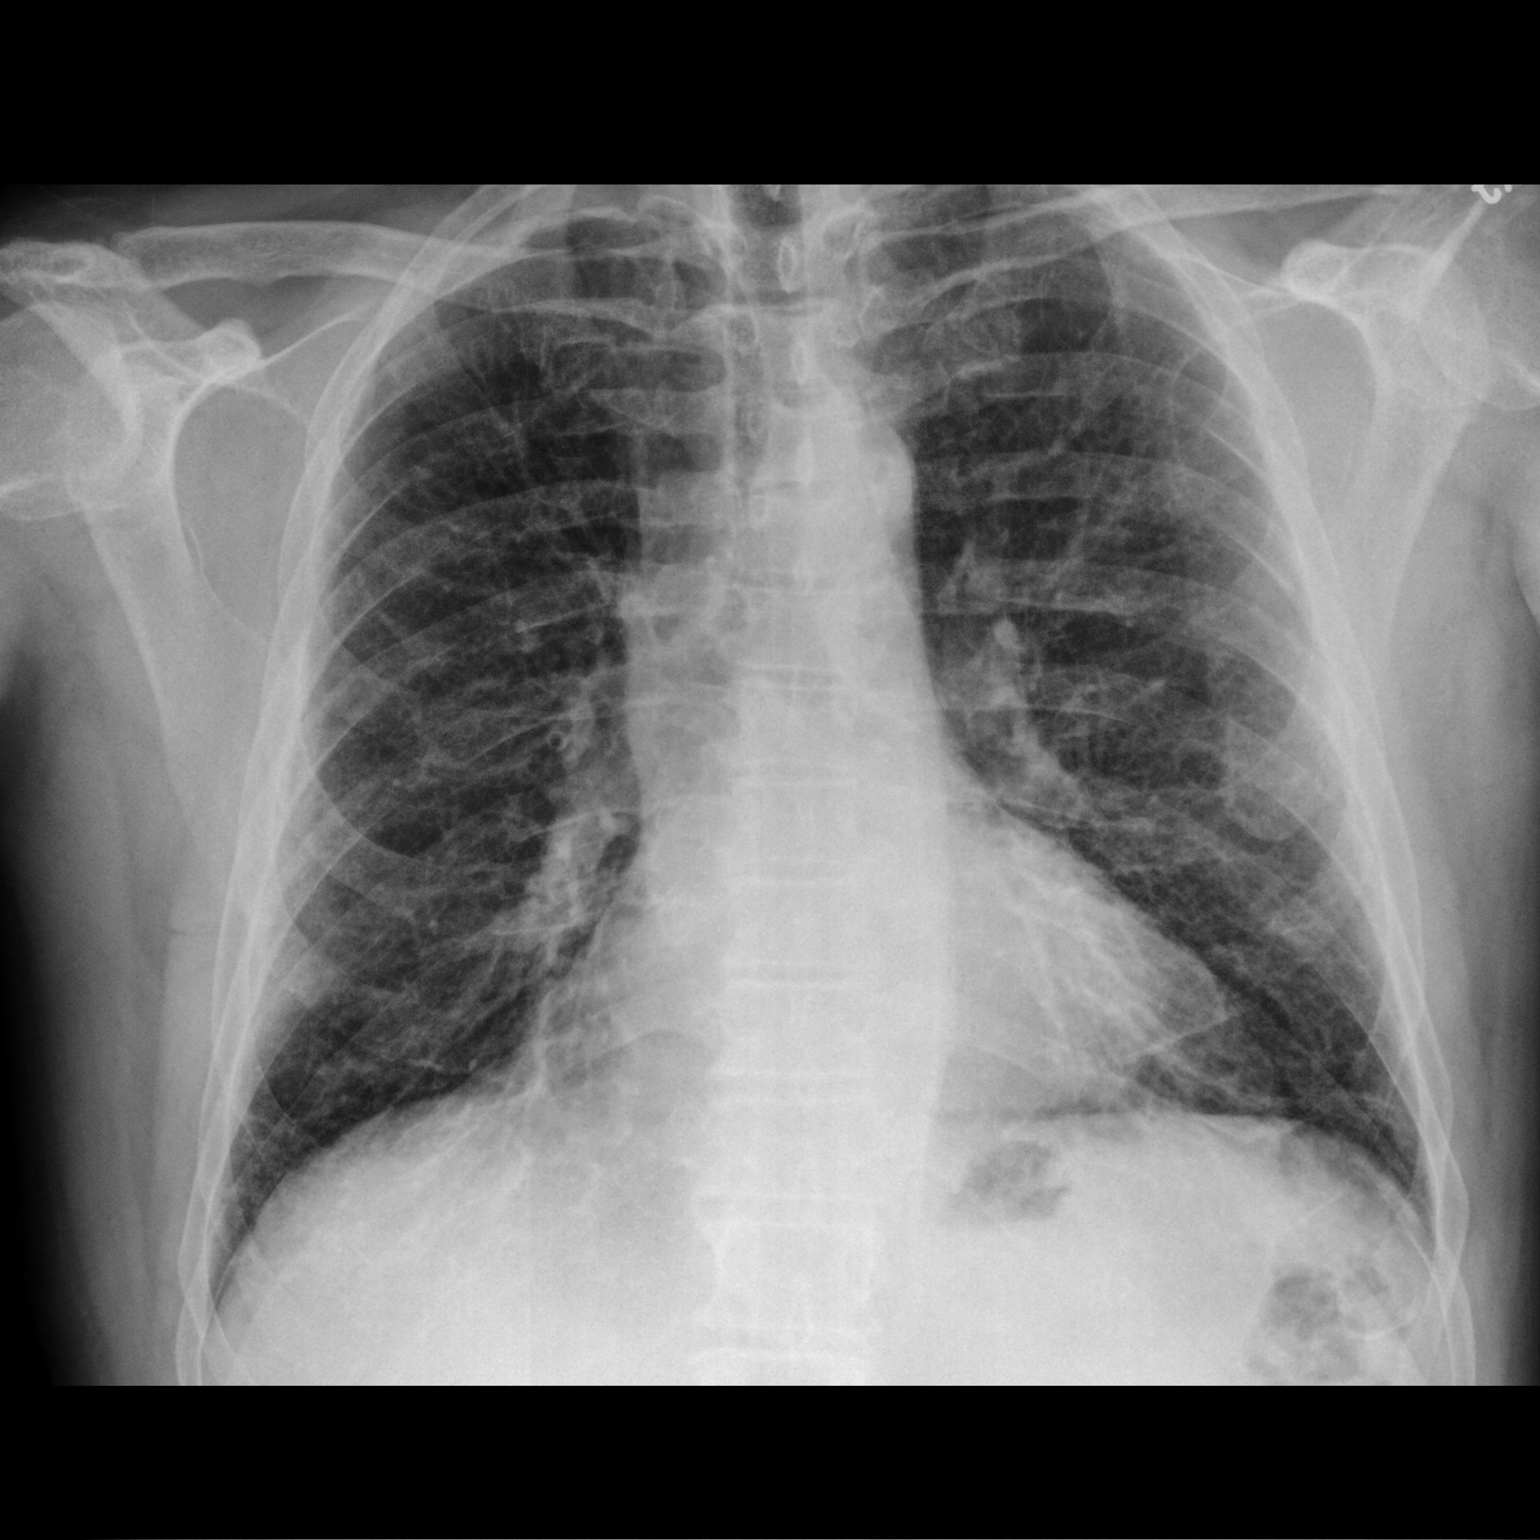

[chest lat]
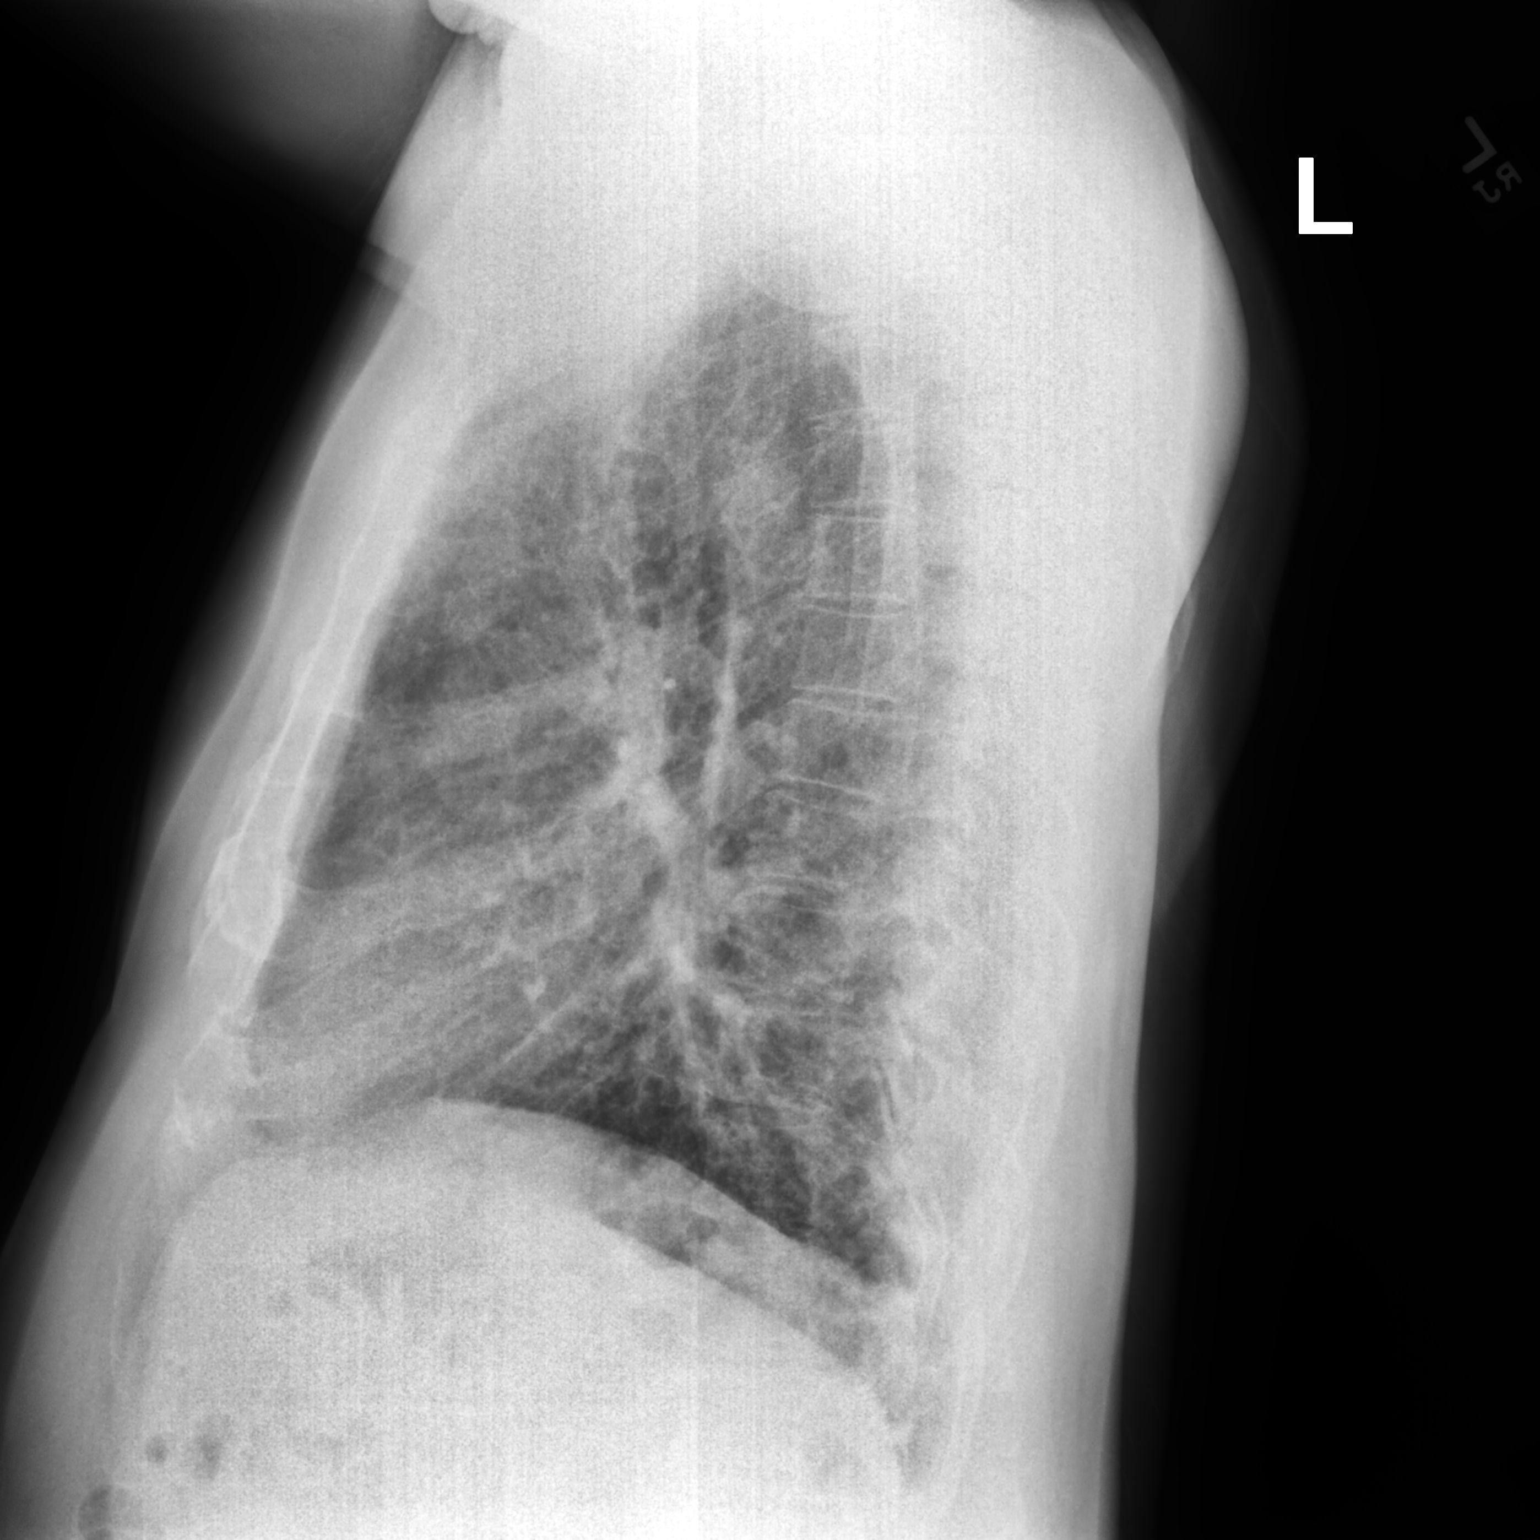

[2 of 2 positions shown; findings below may reference images not displayed]

FINDINGS: There is diffuse chronic interstitial coarsening and bronchitic
changes. No focal consolidation, pleural effusion, pneumothorax.
Left lung base atelectasis. The cardiac silhouette is within limits.
Degenerative changes of the spine. Lower cervical ACDF. No acute
osseous pathology.
IMPRESSION: No acute cardiopulmonary process.

## 2022-07-14 DIAGNOSIS — Z85828 Personal history of other malignant neoplasm of skin: Secondary | ICD-10-CM | POA: Diagnosis not present

## 2022-07-14 DIAGNOSIS — Z86018 Personal history of other benign neoplasm: Secondary | ICD-10-CM | POA: Diagnosis not present

## 2022-07-14 DIAGNOSIS — L298 Other pruritus: Secondary | ICD-10-CM | POA: Diagnosis not present

## 2022-07-14 DIAGNOSIS — L700 Acne vulgaris: Secondary | ICD-10-CM | POA: Diagnosis not present

## 2022-07-14 DIAGNOSIS — Z85820 Personal history of malignant melanoma of skin: Secondary | ICD-10-CM | POA: Diagnosis not present

## 2022-07-14 DIAGNOSIS — L570 Actinic keratosis: Secondary | ICD-10-CM | POA: Diagnosis not present

## 2022-07-14 DIAGNOSIS — L821 Other seborrheic keratosis: Secondary | ICD-10-CM | POA: Diagnosis not present

## 2022-07-14 DIAGNOSIS — L578 Other skin changes due to chronic exposure to nonionizing radiation: Secondary | ICD-10-CM | POA: Diagnosis not present

## 2022-07-14 DIAGNOSIS — D229 Melanocytic nevi, unspecified: Secondary | ICD-10-CM | POA: Diagnosis not present

## 2022-07-15 DIAGNOSIS — Z1212 Encounter for screening for malignant neoplasm of rectum: Secondary | ICD-10-CM | POA: Diagnosis not present

## 2022-07-15 DIAGNOSIS — Z1211 Encounter for screening for malignant neoplasm of colon: Secondary | ICD-10-CM | POA: Diagnosis not present

## 2022-07-16 ENCOUNTER — Other Ambulatory Visit: Payer: Self-pay

## 2022-07-16 ENCOUNTER — Telehealth: Payer: Self-pay | Admitting: Family Medicine

## 2022-07-16 DIAGNOSIS — R7989 Other specified abnormal findings of blood chemistry: Secondary | ICD-10-CM

## 2022-07-16 MED ORDER — TESTOSTERONE 50 MG/5GM (1%) TD GEL: 5.0000 g | Freq: Every day | TRANSDERMAL | 0 refills | Status: DC

## 2022-07-16 NOTE — Telephone Encounter (Signed)
Pt is calling in because he was prescribed testosterone (ANDROGEL) 50 MG/5GM (1%) GEL [[id number]] and the pharmacy says the quantity is missing from the prescription. Please advise.

## 2022-07-16 NOTE — Telephone Encounter (Signed)
Dose: 5 g Route: Transdermal Frequency: Daily  Dispense Quantity: 90 g Refills: 0   Indications of Use: Male Hypogonadism       Sig: Place 5 g onto the skin daily.       Start Date: 07/07/22 End Date: --  Written Date: 07/07/22 Expiration Date: 01/03/23     Associated Diagnoses: Low testosterone in male [R79.89]  Providers  Authorizing Provider: Alba Cory, MD [address], [place] [address] Phone: [phone]   Fax: [phone] DEA #: [id number]   NPI: [id number]      Ordering User: Alba Cory, MD      Pharmacy  [facility] [phone] - [place], [place] - [address] AT [facility] OF [address], [place] [address] Phone: [phone]  Fax: [phone] DEA #: [id number]   Per chart. Contacted pharmacy for clarification they stated it comes in 150g and rx was updated per verbal ok from Dr. Carlynn Purl.

## 2022-07-17 DIAGNOSIS — M5451 Vertebrogenic low back pain: Secondary | ICD-10-CM | POA: Diagnosis not present

## 2022-07-17 DIAGNOSIS — M5136 Other intervertebral disc degeneration, lumbar region: Secondary | ICD-10-CM | POA: Diagnosis not present

## 2022-07-17 DIAGNOSIS — M961 Postlaminectomy syndrome, not elsewhere classified: Secondary | ICD-10-CM | POA: Diagnosis not present

## 2022-07-17 DIAGNOSIS — M5416 Radiculopathy, lumbar region: Secondary | ICD-10-CM | POA: Diagnosis not present

## 2022-07-18 DIAGNOSIS — M533 Sacrococcygeal disorders, not elsewhere classified: Secondary | ICD-10-CM | POA: Diagnosis not present

## 2022-07-23 LAB — COLOGUARD: COLOGUARD: POSITIVE — AB

## 2022-08-06 DIAGNOSIS — M533 Sacrococcygeal disorders, not elsewhere classified: Secondary | ICD-10-CM | POA: Diagnosis not present

## 2022-08-08 ENCOUNTER — Ambulatory Visit
Admission: RE | Admit: 2022-08-08 | Discharge: 2022-08-08 | Disposition: A | Discharge status: Home / Self Care | Payer: Medicare Other | Attending: Internal Medicine | Admitting: Internal Medicine

## 2022-08-08 ENCOUNTER — Ambulatory Visit (INDEPENDENT_AMBULATORY_CARE_PROVIDER_SITE_OTHER): Payer: Medicare Other | Admitting: Internal Medicine

## 2022-08-08 ENCOUNTER — Ambulatory Visit
Admission: RE | Admit: 2022-08-08 | Discharge: 2022-08-08 | Disposition: A | Discharge status: Home / Self Care | Payer: Medicare Other | Source: Ambulatory Visit | Attending: Internal Medicine | Admitting: Internal Medicine

## 2022-08-08 ENCOUNTER — Encounter: Payer: Self-pay | Admitting: Internal Medicine

## 2022-08-08 VITALS — BP 136/80 | HR 108 | Resp 18 | Ht 72.0 in | Wt 203.2 lb

## 2022-08-08 DIAGNOSIS — R051 Acute cough: Secondary | ICD-10-CM

## 2022-08-08 DIAGNOSIS — J0100 Acute maxillary sinusitis, unspecified: Secondary | ICD-10-CM | POA: Diagnosis not present

## 2022-08-08 DIAGNOSIS — Z1211 Encounter for screening for malignant neoplasm of colon: Secondary | ICD-10-CM | POA: Diagnosis not present

## 2022-08-08 DIAGNOSIS — R195 Other fecal abnormalities: Secondary | ICD-10-CM | POA: Diagnosis not present

## 2022-08-08 DIAGNOSIS — R059 Cough, unspecified: Secondary | ICD-10-CM | POA: Diagnosis not present

## 2022-08-08 DIAGNOSIS — R0602 Shortness of breath: Secondary | ICD-10-CM | POA: Diagnosis not present

## 2022-08-08 MED ORDER — AMOXICILLIN-POT CLAVULANATE 875-125 MG PO TABS: 1.0000 | ORAL_TABLET | Freq: Two times a day (BID) | ORAL | 0 refills | Status: AC

## 2022-08-08 MED ORDER — METHYLPREDNISOLONE 4 MG PO TBPK: ORAL_TABLET | ORAL | 0 refills | Status: DC

## 2022-08-08 NOTE — Progress Notes (Signed)
Acute Office Visit  Subjective:     Patient ID: Ronald Ellis, male    DOB: 25-Jun-1960, 62 y.o.   MRN: [id number]  Chief Complaint  Patient presents with   URI    Onset 1.5 weeks with cough, headache, sore throat and congestion     URI  Associated symptoms include congestion, coughing, headaches, sinus pain, a sore throat and wheezing. Pertinent negatives include no chest pain or ear pain.   Patient is in today for cough. Symptoms been going on 10 days. Son in law was sick with similar symptoms.   URI Compliant:  -Fever: no -Cough: yes, productive  -Shortness of breath: yes -Wheezing: yes -Chest congestion: yes -Nasal congestion: yes -Runny nose: yes, yellow mucus  -Sore throat: yes -Sinus pressure: yes -Headache: yes -Ear pain: no  -Ear pressure: yes  -Sick contacts: yes -Context: worse -Treatments attempted: mucinex, tylenol, Excedrin, tessalon perles.   Also recent cologuard test positive.   Review of Systems  Constitutional:  Positive for malaise/fatigue. Negative for chills and fever.  HENT:  Positive for congestion, sinus pain and sore throat. Negative for ear pain.   Respiratory:  Positive for cough, sputum production, shortness of breath and wheezing.   Cardiovascular:  Negative for chest pain.  Neurological:  Positive for headaches.        Objective:    BP 136/80   Pulse (!) 108   Resp 18   Ht 6' (1.829 m)   Wt 203 lb 3.2 oz (92.2 kg)   SpO2 96%   BMI 27.56 kg/m    Physical Exam Constitutional:      Appearance: Normal appearance. He is ill-appearing.  HENT:     Head: Normocephalic and atraumatic.     Right Ear: Tympanic membrane, ear canal and external ear normal.     Left Ear: Tympanic membrane, ear canal and external ear normal.     Nose: Congestion present.     Mouth/Throat:     Mouth: Mucous membranes are moist.     Pharynx: Posterior oropharyngeal erythema present.  Eyes:     Conjunctiva/sclera: Conjunctivae normal.   Cardiovascular:     Rate and Rhythm: Normal rate and regular rhythm.  Pulmonary:     Effort: Pulmonary effort is normal.     Breath sounds: Wheezing and rhonchi present.     Comments: Rhonchi left lower lobe, wheezing throughout  Skin:    General: Skin is warm and dry.  Neurological:     General: No focal deficit present.     Mental Status: He is alert. Mental status is at baseline.  Psychiatric:        Mood and Affect: Mood normal.        Behavior: Behavior normal.     No results found for any visits on 08/08/22.      Assessment & Plan:   1. Acute non-recurrent maxillary sinusitis/Acute cough: Treat sinusitis with Augmentin and Medrol. Obtain chest x-ray to evaluate for PNA.   - amoxicillin-clavulanate (AUGMENTIN) 875-125 MG tablet; Take 1 tablet by mouth 2 (two) times daily for 5 days.  Dispense: 10 tablet; Refill: 0 - methylPREDNISolone (MEDROL DOSEPAK) 4 MG TBPK tablet; Day 1: Take 8 mg (2 tablets) before breakfast, 4 mg (1 tablet) after lunch, 4 mg (1 tablet) after supper, and 8 mg (2 tablets) at bedtime. Day 2:Take 4 mg (1 tablet) before breakfast, 4 mg (1 tablet) after lunch, 4 mg (1 tablet) after supper, and 8 mg (2 tablets) at bedtime. Day  3: Take 4 mg (1 tablet) before breakfast, 4 mg (1 tablet) after lunch, 4 mg (1 tablet) after supper, and 4 mg (1 tablet) at bedtime. Day 4: Take 4 mg (1 tablet) before breakfast, 4 mg (1 tablet) after lunch, and 4 mg (1 tablet) at bedtime. Day 5: Take 4 mg (1 tablet) before breakfast and 4 mg (1 tablet) at bedtime. Day 6: Take 4 mg (1 tablet) before breakfast.  Dispense: 1 each; Refill: 0  2. Colon cancer screening/Positive colorectal cancer screening using Cologuard test: Referral placed to GI.   - Ambulatory referral to Gastroenterology  Return if symptoms worsen or fail to improve.  Margarita Mail, DO

## 2022-08-12 ENCOUNTER — Other Ambulatory Visit: Payer: Self-pay

## 2022-08-12 ENCOUNTER — Telehealth: Payer: Self-pay

## 2022-08-12 DIAGNOSIS — R195 Other fecal abnormalities: Secondary | ICD-10-CM

## 2022-08-12 DIAGNOSIS — Z1211 Encounter for screening for malignant neoplasm of colon: Secondary | ICD-10-CM

## 2022-08-12 MED ORDER — NA SULFATE-K SULFATE-MG SULF 17.5-3.13-1.6 GM/177ML PO SOLN: 1.0000 | Freq: Once | ORAL | 0 refills | Status: AC

## 2022-08-12 NOTE — Telephone Encounter (Signed)
Patient states he received a text message that he needed to call our office to schedule his colonoscopy. Informed him that the schedulers had his message and would call him as soon as they could. Informed him that one of the schedulers was off today so the one was working has hard as she could to return everybody calls and it might be tomorrow morning when they get back to him

## 2022-08-12 NOTE — Progress Notes (Unsigned)
Name: Ronald Ellis   MRN: [id number]    DOB: 1961/01/30   Date:08/13/2022       Progress Note  Subjective  Chief Complaint  Annual Exam  HPI  Patient presents for annual CPE.  IPSS Questionnaire (AUA-7): Over the past month.   1)  How often have you had a sensation of not emptying your bladder completely after you finish urinating?  0 - Not at all  2)  How often have you had to urinate again less than two hours after you finished urinating? 0 - Not at all  3)  How often have you found you stopped and started again several times when you urinated?  1 - Less than 1 time in 5  4) How difficult have you found it to postpone urination?  0 - Not at all  5) How often have you had a weak urinary stream?  0 - Not at all  6) How often have you had to push or strain to begin urination?  0 - Not at all  7) How many times did you most typically get up to urinate from the time you went to bed until the time you got up in the morning?  1 - 1 time  Total score:  0-7 mildly symptomatic   8-19 moderately symptomatic   20-35 severely symptomatic     Diet: mediterranean diet, but for breakfast is a shake  Exercise: he walks 5 thousand steps per day  Last Dental Exam: due for a follow up Last Eye Exam: he needs to schedule an exam   Depression: phq 9 is negative    08/13/2022   11:26 AM 08/08/2022    2:17 PM 07/07/2022    2:00 PM 05/13/2022   11:17 AM 04/08/2022    9:29 AM  Depression screen PHQ 2/9  Decreased Interest 0 0 1 2 0  Down, Depressed, Hopeless 0 0 1 1 0  PHQ - 2 Score 0 0 2 3 0  Altered sleeping  0 1 1 0  Tired, decreased energy  0 3 1 0  Change in appetite  0 0 1 0  Feeling bad or failure about yourself   0 0 0 0  Trouble concentrating  0 0 0 0  Moving slowly or fidgety/restless  0 0 0 0  Suicidal thoughts  0 0 0 0  PHQ-9 Score  0 6 6 0  Difficult doing work/chores  Not difficult at all  Not difficult at all Not difficult at all    Hypertension:  BP Readings from Last 3  Encounters:  08/13/22 128/70  08/08/22 136/80  07/07/22 122/74    Obesity: Wt Readings from Last 3 Encounters:  08/13/22 203 lb (92.1 kg)  08/08/22 203 lb 3.2 oz (92.2 kg)  07/07/22 205 lb (93 kg)   BMI Readings from Last 3 Encounters:  08/13/22 27.53 kg/m  08/08/22 27.56 kg/m  07/07/22 27.80 kg/m     Lipids:  Lab Results  Component Value Date   CHOL 198 05/13/2022   CHOL 244 (H) 05/03/2021   CHOL 219 (H) 05/30/2019   Lab Results  Component Value Date   HDL 44 05/13/2022   HDL 39 (L) 05/03/2021   HDL 42 05/30/2019   Lab Results  Component Value Date   LDLCALC 135 (H) 05/13/2022   LDLCALC 183 (H) 05/03/2021   LDLCALC 158 (H) 05/30/2019   Lab Results  Component Value Date   TRIG 89 05/13/2022   TRIG 102 05/03/2021  TRIG 86 05/30/2019   Lab Results  Component Value Date   CHOLHDL 4.5 05/13/2022   CHOLHDL 6.3 (H) 05/03/2021   CHOLHDL 5.2 (H) 05/30/2019   No results found for: "LDLDIRECT" Glucose:  Glucose  Date Value Ref Range Status  05/12/2012 65 65 - 99 mg/dL Final   Glucose, Bld  Date Value Ref Range Status  05/13/2022 80 65 - 99 mg/dL Final    Comment:    .            Fasting reference interval .   08/02/2021 65 65 - 99 mg/dL Final    Comment:    .            Fasting reference interval .   05/03/2021 84 65 - 99 mg/dL Final    Comment:    .            Fasting reference interval .     Flowsheet Row Office Visit from 07/07/2022 in [facility]  AUDIT-C Score 1       Significant Other STD testing and prevention (HIV/chl/gon/syphilis): 06/29/17 Sexual history:  Hep C Screening: 06/29/17 Skin cancer: Discussed monitoring for atypical lesions Colorectal cancer: 12/06/18 Prostate cancer:   Lab Results  Component Value Date   PSA 1.11 08/02/2021     Lung cancer:  up to date  AAA: The USPSTF recommends one-time screening with ultrasonography in men ages 49 to 75 years who have ever smoked. Patient   no, a  candidate for screening  ECG:  06/24/19  Vaccines:   RSV: discussed with patient  Tdap: up to date Shingrix: discussed with patient  Pneumonia: up to date Flu: up to date COVID-19: up to date  Advanced Care Planning: A voluntary discussion about advance care planning including the explanation and discussion of advance directives.  Discussed health care proxy and Living will, and the patient was able to identify a health care proxy as wife .  Patient does not have a living will and power of attorney of health care   Patient Active Problem List   Diagnosis Date Noted   Emphysema lung (HCC) 08/12/2021   Moderate major depression (HCC) 05/03/2021   Nocturnal hypoxia 06/25/2020   Migraine without aura and without status migrainosus, not intractable 05/02/2020   Seasonal affective disorder (HCC) 05/02/2020   Chest pain of uncertain etiology 06/24/2019   Atherosclerosis of aorta (HCC) 12/17/2018   Personal history of tobacco use, presenting hazards to health 12/16/2018   History of drug dependence/abuse (HCC) 11/26/2018   Celiac disease 02/05/2018   Right-sided low back pain with right-sided sciatica 12/05/2015   Plantar fasciitis of right foot 10/15/2015   Chronic neck and back pain 07/19/2015    Past Surgical History:  Procedure Laterality Date   BACK SURGERY  2015   BACK SURGERY     INGUINAL HERNIA REPAIR Bilateral    multiple times   lumbar discetomy  2005   MOLE REMOVAL     NECK SURGERY  2016   SPINE SURGERY  12/15/01    Family History  Problem Relation Age of Onset   Hypertension Father    Lung disease Father    Hypertension Brother    Lung cancer Maternal Grandfather     Social History   Socioeconomic History   Marital status: Significant Other    Spouse name: Duncan Dull   Number of children: 4   Years of education: Not on file   Highest education level: Associate  degree: occupational, technical, or vocational program  Occupational History   Occupation:  disbaled     Comment: Loss adjuster, [employer]   Tobacco Use   Smoking status: Former    Packs/day: 1.00    Years: 42.00    Additional pack years: 0.00    Total pack years: 42.00    Types: Cigarettes    Start date: 60    Quit date: 12/13/2011    Years since quitting: 10.6   Smokeless tobacco: Never  Vaping Use   Vaping Use: Never used  Substance and Sexual Activity   Alcohol use: Not Currently    Comment: quit 5-6 years ago, only beer - but glutten free diet now   Drug use: Not Currently    Types: "Crack" cocaine, Cocaine, Heroin, Marijuana, Other-see comments    Comment: meths in his 81's-30's   Sexual activity: Yes    Partners: Female  Other Topics Concern   Not on file  Social History Narrative   Approved for disability 06/2017 ( workman's comp back 2014) , secondary to neck and back injury    Social Determinants of Health   Financial Resource Strain: Low Risk  (07/03/2022)   Overall Financial Resource Strain (CARDIA)    Difficulty of Paying Living Expenses: Not very hard  Food Insecurity: Food Insecurity Present (07/03/2022)   Hunger Vital Sign    Worried About Running Out of Food in the Last Year: Sometimes true    Ran Out of Food in the Last Year: Sometimes true  Transportation Needs: No Transportation Needs (07/03/2022)   PRAPARE - Administrator, Civil Service (Medical): No    Lack of Transportation (Non-Medical): No  Physical Activity: Insufficiently Active (07/03/2022)   Exercise Vital Sign    Days of Exercise per Week: 1 day    Minutes of Exercise per Session: 10 min  Stress: Stress Concern Present (07/03/2022)   [employer] of Occupational Health - Occupational Stress Questionnaire    Feeling of Stress : To some extent  Social Connections: Moderately Isolated (07/03/2022)   Social Connection and Isolation Panel [NHANES]    Frequency of Communication with Friends and Family: Three times a week    Frequency of Social Gatherings with  Friends and Family: Twice a week    Attends Religious Services: Never    Database administrator or Organizations: No    Attends Banker Meetings: Never    Marital Status: Living with partner  Intimate Partner Violence: Not At Risk (08/13/2022)   Humiliation, Afraid, Rape, and Kick questionnaire    Fear of Current or Ex-Partner: No    Emotionally Abused: No    Physically Abused: No    Sexually Abused: No     Current Outpatient Medications:    albuterol (VENTOLIN HFA) 108 (90 Base) MCG/ACT inhaler, Inhale 2 puffs into the lungs every 6 (six) hours as needed for wheezing or shortness of breath., Disp: 18 g, Rfl: 0   amoxicillin-clavulanate (AUGMENTIN) 875-125 MG tablet, Take 1 tablet by mouth 2 (two) times daily for 5 days., Disp: 10 tablet, Rfl: 0   Ascorbic Acid (VITAMIN C PO), Take by mouth., Disp: , Rfl:    DULoxetine (CYMBALTA) 60 MG capsule, Take 1 capsule (60 mg total) by mouth daily., Disp: 90 capsule, Rfl: 0   ezetimibe (ZETIA) 10 MG tablet, Take 1 tablet (10 mg total) by mouth daily., Disp: 90 tablet, Rfl: 3   hydrOXYzine (ATARAX) 10 MG tablet, TAKE 1 TABLET(10 MG)  BY MOUTH THREE TIMES DAILY AS NEEDED, Disp: 90 tablet, Rfl: 1   L-Citrulline POWD, by Does not apply route daily., Disp: , Rfl:    MAGNESIUM PO, Take by mouth., Disp: , Rfl:    methylPREDNISolone (MEDROL DOSEPAK) 4 MG TBPK tablet, Day 1: Take 8 mg (2 tablets) before breakfast, 4 mg (1 tablet) after lunch, 4 mg (1 tablet) after supper, and 8 mg (2 tablets) at bedtime. Day 2:Take 4 mg (1 tablet) before breakfast, 4 mg (1 tablet) after lunch, 4 mg (1 tablet) after supper, and 8 mg (2 tablets) at bedtime. Day 3: Take 4 mg (1 tablet) before breakfast, 4 mg (1 tablet) after lunch, 4 mg (1 tablet) after supper, and 4 mg (1 tablet) at bedtime. Day 4: Take 4 mg (1 tablet) before breakfast, 4 mg (1 tablet) after lunch, and 4 mg (1 tablet) at bedtime. Day 5: Take 4 mg (1 tablet) before breakfast and 4 mg (1 tablet) at  bedtime. Day 6: Take 4 mg (1 tablet) before breakfast., Disp: 1 each, Rfl: 0   Misc Natural Products (MAGIC MUSHROOM MIX) CAPS, Take 2 capsules by mouth daily., Disp: , Rfl:    MORINGA OLEIFERA PO, Take 1,200 mg by mouth daily at 12 noon., Disp: , Rfl:    naproxen (NAPROSYN) 500 MG tablet, Take 500 mg by mouth 2 (two) times daily as needed., Disp: , Rfl:    pregabalin (LYRICA) 100 MG capsule, Take 1 capsule (100 mg total) by mouth 2 (two) times daily., Disp: 180 capsule, Rfl: 1   rizatriptan (MAXALT) 10 MG tablet, Take 1 tablet (10 mg total) by mouth as needed for migraine. May repeat in 2 hours if needed, Disp: 10 tablet, Rfl: 1   testosterone (ANDROGEL) 50 MG/5GM (1%) GEL, Place 5 g onto the skin daily., Disp: 150 g, Rfl: 0   tiZANidine (ZANAFLEX) 4 MG tablet, Take 1 tablet (4 mg total) by mouth at bedtime., Disp: 90 tablet, Rfl: 1   TWYNEO 0.1-3 % CREA, Apply 1 application topically daily. PRN, Disp: , Rfl:    zinc gluconate 50 MG tablet, Take 50 mg by mouth daily., Disp: , Rfl:   Allergies  Allergen Reactions   Statins Other (See Comments)    Fever, chills, body aches   Gluten Meal      ROS  Constitutional: Negative for fever or weight change.  Respiratory: positive  for cough and intermittent  shortness of breath.   Cardiovascular: Negative for chest pain or palpitations.  Gastrointestinal: Negative for abdominal pain, no bowel changes.  Musculoskeletal: Negative for gait problem or joint swelling.  Skin: Negative for rash.  Neurological: Negative for dizziness or headache.  No other specific complaints in a complete review of systems (except as listed in HPI above).   Objective  Vitals:   08/13/22 1130  BP: 128/70  Pulse: 86  Resp: 16  SpO2: 99%  Weight: 203 lb (92.1 kg)  Height: 6' (1.829 m)    Body mass index is 27.53 kg/m.  Physical Exam  Constitutional: Patient appears well-developed and well-nourished. No distress.  HENT: Head: Normocephalic and atraumatic.  Ears: B TMs ok, no erythema or effusion; Nose: Nose normal. Mouth/Throat: Oropharynx is clear and moist. No oropharyngeal exudate.  Eyes: Conjunctivae and EOM are normal. Pupils are equal, round, and reactive to light. No scleral icterus.  Neck: Normal range of motion. Neck supple. No JVD present. No thyromegaly present.  Cardiovascular: Normal rate, regular rhythm and normal heart sounds.  No murmur  heard. No BLE edema. Pulmonary/Chest: Effort normal and breath sounds normal. No respiratory distress. Abdominal: Soft. Bowel sounds are normal, no distension. There is no tenderness. Palpable mash from previous inguinal hernia repair, more noticeable on left side  MALE GENITALIA: Normal descended testes bilaterally, no masses palpated, no hernias, no lesions, no discharge RECTAL: Prostate normal size and consistency, no rectal masses or hemorrhoids Musculoskeletal: Normal range of motion, no joint effusions. No gross deformities Neurological: he is alert and oriented to person, place, and time. No cranial nerve deficit. Coordination, balance, strength, speech and gait are normal.  Skin: multiple moles, sees Dermatologist  Psychiatric: Patient has a normal mood and affect. behavior is normal. Judgment and thought content normal.   Fall Risk:    08/13/2022   11:26 AM 08/08/2022    2:17 PM 07/07/2022    1:59 PM 05/13/2022   11:17 AM 04/08/2022    9:29 AM  Fall Risk   Falls in the past year? 0 0 0 0 0  Number falls in past yr: 0 0 0  0  Injury with Fall? 0 0 0  0  Risk for fall due to : No Fall Risks  No Fall Risks No Fall Risks No Fall Risks  Follow up Falls prevention discussed  Falls prevention discussed Falls prevention discussed Falls prevention discussed;Education provided;Falls evaluation completed     Functional Status Survey: Is the patient deaf or have difficulty hearing?: No Does the patient have difficulty seeing, even when wearing glasses/contacts?: No Does the patient have difficulty  concentrating, remembering, or making decisions?: No Does the patient have difficulty walking or climbing stairs?: No Does the patient have difficulty dressing or bathing?: No Does the patient have difficulty doing errands alone such as visiting a doctor's office or shopping?: No    Assessment & Plan  1. Well adult exam   2. Positive colorectal cancer screening using Cologuard test  He has an appointment scheduled with GI     -Prostate cancer screening and PSA options (with potential risks and benefits of testing vs not testing) were discussed along with recent recs/guidelines. -USPSTF grade A and B recommendations reviewed with patient; age-appropriate recommendations, preventive care, screening tests, etc discussed and encouraged; healthy living encouraged; see AVS for patient education given to patient -Discussed importance of 150 minutes of physical activity weekly, eat two servings of fish weekly, eat one serving of tree nuts ( cashews, pistachios, pecans, almonds.Marland Kitchen) every other day, eat 6 servings of fruit/vegetables daily and drink plenty of water and avoid sweet beverages.  -Reviewed Health Maintenance: yes

## 2022-08-12 NOTE — Telephone Encounter (Signed)
Gastroenterology Pre-Procedure Review  Request Date: 09/15/22 Requesting Physician: Dr. Servando Snare  PATIENT REVIEW QUESTIONS: The patient responded to the following health history questions as indicated:    1. Are you having any GI issues? no 2. Do you have a personal history of Polyps? no 3. Do you have a family history of Colon Cancer or Polyps? no 4. Diabetes Mellitus? no 5. Joint replacements in the past 12 months?no 6. Major health problems in the past 3 months?no 7. Any artificial heart valves, MVP, or defibrillator?no    MEDICATIONS & ALLERGIES:    Patient reports the following regarding taking any anticoagulation/antiplatelet therapy:   Plavix, Coumadin, Eliquis, Xarelto, Lovenox, Pradaxa, Brilinta, or Effient? no Aspirin? no  Patient confirms/reports the following medications:  Current Outpatient Medications  Medication Sig Dispense Refill   albuterol (VENTOLIN HFA) 108 (90 Base) MCG/ACT inhaler Inhale 2 puffs into the lungs every 6 (six) hours as needed for wheezing or shortness of breath. 18 g 0   amoxicillin-clavulanate (AUGMENTIN) 875-125 MG tablet Take 1 tablet by mouth 2 (two) times daily for 5 days. 10 tablet 0   Ascorbic Acid (VITAMIN C PO) Take by mouth.     DULoxetine (CYMBALTA) 60 MG capsule Take 1 capsule (60 mg total) by mouth daily. 90 capsule 0   ezetimibe (ZETIA) 10 MG tablet Take 1 tablet (10 mg total) by mouth daily. 90 tablet 3   hydrOXYzine (ATARAX) 10 MG tablet TAKE 1 TABLET(10 MG) BY MOUTH THREE TIMES DAILY AS NEEDED 90 tablet 1   L-Citrulline POWD by Does not apply route daily.     MAGNESIUM PO Take by mouth.     methylPREDNISolone (MEDROL DOSEPAK) 4 MG TBPK tablet Day 1: Take 8 mg (2 tablets) before breakfast, 4 mg (1 tablet) after lunch, 4 mg (1 tablet) after supper, and 8 mg (2 tablets) at bedtime. Day 2:Take 4 mg (1 tablet) before breakfast, 4 mg (1 tablet) after lunch, 4 mg (1 tablet) after supper, and 8 mg (2 tablets) at bedtime. Day 3: Take 4 mg (1  tablet) before breakfast, 4 mg (1 tablet) after lunch, 4 mg (1 tablet) after supper, and 4 mg (1 tablet) at bedtime. Day 4: Take 4 mg (1 tablet) before breakfast, 4 mg (1 tablet) after lunch, and 4 mg (1 tablet) at bedtime. Day 5: Take 4 mg (1 tablet) before breakfast and 4 mg (1 tablet) at bedtime. Day 6: Take 4 mg (1 tablet) before breakfast. 1 each 0   Misc Natural Products (MAGIC MUSHROOM MIX) CAPS Take 2 capsules by mouth daily.     MORINGA OLEIFERA PO Take 1,200 mg by mouth daily at 12 noon.     naproxen (NAPROSYN) 500 MG tablet Take 500 mg by mouth 2 (two) times daily as needed.     pregabalin (LYRICA) 100 MG capsule Take 1 capsule (100 mg total) by mouth 2 (two) times daily. 180 capsule 1   rizatriptan (MAXALT) 10 MG tablet Take 1 tablet (10 mg total) by mouth as needed for migraine. May repeat in 2 hours if needed 10 tablet 1   testosterone (ANDROGEL) 50 MG/5GM (1%) GEL Place 5 g onto the skin daily. 150 g 0   tiZANidine (ZANAFLEX) 4 MG tablet Take 1 tablet (4 mg total) by mouth at bedtime. 90 tablet 1   TWYNEO 0.1-3 % CREA Apply 1 application topically daily. PRN     zinc gluconate 50 MG tablet Take 50 mg by mouth daily.     No current facility-administered  medications for this visit.    Patient confirms/reports the following allergies:  Allergies  Allergen Reactions   Statins Other (See Comments)    Fever, chills, body aches   Gluten Meal     No orders of the defined types were placed in this encounter.   AUTHORIZATION INFORMATION Primary Insurance: 1D#: Group #:  Secondary Insurance: 1D#: Group #:  SCHEDULE INFORMATION: Date: 09/15/22 Time: Location: [facility]

## 2022-08-12 NOTE — Telephone Encounter (Signed)
PT left message to schedule colonoscopy please return call 

## 2022-08-13 ENCOUNTER — Encounter: Payer: Self-pay | Admitting: Family Medicine

## 2022-08-13 ENCOUNTER — Ambulatory Visit (INDEPENDENT_AMBULATORY_CARE_PROVIDER_SITE_OTHER): Payer: Medicare Other | Admitting: Family Medicine

## 2022-08-13 VITALS — BP 128/70 | HR 86 | Resp 16 | Ht 72.0 in | Wt 203.0 lb

## 2022-08-13 DIAGNOSIS — R195 Other fecal abnormalities: Secondary | ICD-10-CM | POA: Diagnosis not present

## 2022-08-13 DIAGNOSIS — Z0000 Encounter for general adult medical examination without abnormal findings: Secondary | ICD-10-CM

## 2022-08-21 DIAGNOSIS — M5416 Radiculopathy, lumbar region: Secondary | ICD-10-CM | POA: Diagnosis not present

## 2022-08-21 DIAGNOSIS — M5451 Vertebrogenic low back pain: Secondary | ICD-10-CM | POA: Diagnosis not present

## 2022-09-09 ENCOUNTER — Encounter: Payer: Self-pay | Admitting: Gastroenterology

## 2022-09-11 NOTE — Anesthesia Preprocedure Evaluation (Addendum)
Anesthesia Evaluation  Patient identified by MRN, date of birth, ID band Patient awake    Reviewed: Allergy & Precautions, H&P , NPO status , Patient's Chart, lab work & pertinent test results  Airway Mallampati: I  TM Distance: >3 FB Neck ROM: Full    Dental no notable dental hx.    Pulmonary neg pulmonary ROS, pneumonia, COPD, former smoker   Pulmonary exam normal breath sounds clear to auscultation       Cardiovascular negative cardio ROS Normal cardiovascular exam Rhythm:Regular Rate:Normal     Neuro/Psych  Headaches PSYCHIATRIC DISORDERS  Depression     Neuromuscular disease negative neurological ROS  negative psych ROS   GI/Hepatic negative GI ROS, Neg liver ROS,,,  Endo/Other  negative endocrine ROS    Renal/GU negative Renal ROS  negative genitourinary   Musculoskeletal negative musculoskeletal ROS (+) Arthritis ,    Abdominal   Peds negative pediatric ROS (+)  Hematology negative hematology ROS (+)   Anesthesia Other Findings COPD   Chronic pain Pneumonia  Arthritis Gluten intolerance  Depression Oxygen deficiency---states he is not on oxygen and can walk up a hill or steps   Reproductive/Obstetrics negative OB ROS                             Anesthesia Physical Anesthesia Plan  ASA: 4  Anesthesia Plan: General   Post-op Pain Management:    Induction: Intravenous  PONV Risk Score and Plan:   Airway Management Planned: Natural Airway and Nasal Cannula  Additional Equipment:   Intra-op Plan:   Post-operative Plan:   Informed Consent: I have reviewed the patients History and Physical, chart, labs and discussed the procedure including the risks, benefits and alternatives for the proposed anesthesia with the patient or authorized representative who has indicated his/her understanding and acceptance.     Dental Advisory Given  Plan Discussed with:  Anesthesiologist, CRNA and Surgeon  Anesthesia Plan Comments: (Patient consented for risks of anesthesia including but not limited to:  - adverse reactions to medications - risk of airway placement if required - damage to eyes, teeth, lips or other oral mucosa - nerve damage due to positioning  - sore throat or hoarseness - Damage to heart, brain, nerves, lungs, other parts of body or loss of life  Patient voiced understanding.)       Anesthesia Quick Evaluation

## 2022-09-12 DIAGNOSIS — M5451 Vertebrogenic low back pain: Secondary | ICD-10-CM | POA: Diagnosis not present

## 2022-09-12 DIAGNOSIS — M5416 Radiculopathy, lumbar region: Secondary | ICD-10-CM | POA: Diagnosis not present

## 2022-09-12 DIAGNOSIS — M47896 Other spondylosis, lumbar region: Secondary | ICD-10-CM | POA: Diagnosis not present

## 2022-09-12 DIAGNOSIS — M961 Postlaminectomy syndrome, not elsewhere classified: Secondary | ICD-10-CM | POA: Diagnosis not present

## 2022-09-12 DIAGNOSIS — M5136 Other intervertebral disc degeneration, lumbar region: Secondary | ICD-10-CM | POA: Diagnosis not present

## 2022-09-15 ENCOUNTER — Ambulatory Visit
Admission: RE | Admit: 2022-09-15 | Discharge: 2022-09-15 | Disposition: A | Discharge status: Home / Self Care | Payer: Medicare Other | Attending: Gastroenterology | Admitting: Gastroenterology

## 2022-09-15 ENCOUNTER — Encounter
Admission: RE | Disposition: A | Discharge status: Home / Self Care | Payer: Self-pay | Source: Home / Self Care | Attending: Gastroenterology

## 2022-09-15 ENCOUNTER — Ambulatory Visit: Payer: Medicare Other | Admitting: Anesthesiology

## 2022-09-15 ENCOUNTER — Other Ambulatory Visit: Payer: Self-pay

## 2022-09-15 ENCOUNTER — Encounter: Payer: Self-pay | Admitting: Gastroenterology

## 2022-09-15 DIAGNOSIS — Z1211 Encounter for screening for malignant neoplasm of colon: Secondary | ICD-10-CM | POA: Insufficient documentation

## 2022-09-15 DIAGNOSIS — R195 Other fecal abnormalities: Secondary | ICD-10-CM | POA: Insufficient documentation

## 2022-09-15 DIAGNOSIS — F32A Depression, unspecified: Secondary | ICD-10-CM | POA: Insufficient documentation

## 2022-09-15 DIAGNOSIS — K5730 Diverticulosis of large intestine without perforation or abscess without bleeding: Secondary | ICD-10-CM | POA: Insufficient documentation

## 2022-09-15 DIAGNOSIS — Z87891 Personal history of nicotine dependence: Secondary | ICD-10-CM | POA: Insufficient documentation

## 2022-09-15 DIAGNOSIS — G43909 Migraine, unspecified, not intractable, without status migrainosus: Secondary | ICD-10-CM | POA: Insufficient documentation

## 2022-09-15 DIAGNOSIS — J449 Chronic obstructive pulmonary disease, unspecified: Secondary | ICD-10-CM | POA: Diagnosis not present

## 2022-09-15 DIAGNOSIS — K635 Polyp of colon: Secondary | ICD-10-CM

## 2022-09-15 DIAGNOSIS — J439 Emphysema, unspecified: Secondary | ICD-10-CM | POA: Diagnosis not present

## 2022-09-15 HISTORY — PX: COLONOSCOPY WITH PROPOFOL: SHX5780

## 2022-09-15 SURGERY — COLONOSCOPY WITH PROPOFOL
Anesthesia: General | Site: Rectum

## 2022-09-15 MED ORDER — STERILE WATER FOR IRRIGATION IR SOLN
Status: DC | PRN
  Administered 2022-09-15: 1000 mL

## 2022-09-15 MED ORDER — LACTATED RINGERS IV SOLN: INTRAVENOUS | Status: DC

## 2022-09-15 MED ORDER — PROPOFOL 10 MG/ML IV BOLUS
INTRAVENOUS | Status: DC | PRN
  Administered 2022-09-15 (×2): 25 mg via INTRAVENOUS
  Administered 2022-09-15: 50 mg via INTRAVENOUS
  Administered 2022-09-15: 100 mg via INTRAVENOUS
  Administered 2022-09-15: 50 mg via INTRAVENOUS

## 2022-09-15 MED ORDER — SODIUM CHLORIDE 0.9 % IV SOLN: INTRAVENOUS | Status: DC

## 2022-09-15 SURGICAL SUPPLY — 22 items
CLIP HMST 235XBRD CATH ROT (MISCELLANEOUS) IMPLANT
CLIP RESOLUTION 360 11X235 (MISCELLANEOUS)
ELECT REM PT RETURN 9FT ADLT (ELECTROSURGICAL)
ELECTRODE REM PT RTRN 9FT ADLT (ELECTROSURGICAL) IMPLANT
FORCEPS BIOP RAD 4 LRG CAP 4 (CUTTING FORCEPS) IMPLANT
GOWN CVR UNV OPN BCK APRN NK (MISCELLANEOUS) ×2 IMPLANT
GOWN ISOL THUMB LOOP REG UNIV (MISCELLANEOUS) ×2
INJECTOR VARIJECT VIN23 (MISCELLANEOUS) IMPLANT
KIT DEFENDO VALVE AND CONN (KITS) IMPLANT
KIT PRC NS LF DISP ENDO (KITS) ×1 IMPLANT
KIT PROCEDURE OLYMPUS (KITS) ×1
MANIFOLD NEPTUNE II (INSTRUMENTS) ×1 IMPLANT
MARKER SPOT ENDO TATTOO 5ML (MISCELLANEOUS) IMPLANT
PROBE APC STR FIRE (PROBE) IMPLANT
RETRIEVER NET ROTH 2.5X230 LF (MISCELLANEOUS) IMPLANT
SNARE COLD EXACTO (MISCELLANEOUS) IMPLANT
SNARE LASSO HEX 3 IN 1 (INSTRUMENTS) IMPLANT
SNARE SHORT THROW 13M SML OVAL (MISCELLANEOUS) IMPLANT
SNARE SNG USE RND 15MM (INSTRUMENTS) IMPLANT
TRAP ETRAP POLY (MISCELLANEOUS) IMPLANT
VARIJECT INJECTOR VIN23 (MISCELLANEOUS)
WATER STERILE IRR 250ML POUR (IV SOLUTION) ×1 IMPLANT

## 2022-09-15 NOTE — Transfer of Care (Signed)
Immediate Anesthesia Transfer of Care Note  Patient: Ronald Ellis  Procedure(s) Performed: COLONOSCOPY WITH PROPOFOL (Rectum)  Patient Location: PACU  Anesthesia Type: General  Level of Consciousness: awake, alert  and patient cooperative  Airway and Oxygen Therapy: Patient Spontanous Breathing and Patient connected to supplemental oxygen  Post-op Assessment: Post-op Vital signs reviewed, Patient's Cardiovascular Status Stable, Respiratory Function Stable, Patent Airway and No signs of Nausea or vomiting  Post-op Vital Signs: Reviewed and stable  Complications: No notable events documented.

## 2022-09-15 NOTE — H&P (Signed)
[name], MD [facility] [address] [place], [place] [address] Phone:[phone] Fax : [phone]  Primary Care Physician:  Alba Cory, MD Primary Gastroenterologist:  Dr. Servando Snare  Pre-Procedure History & Physical: HPI:  Ronald Ellis is a 62 y.o. male is here for an colonoscopy.   Past Medical History:  Diagnosis Date   Arthritis    Chronic pain    COPD (chronic obstructive pulmonary disease) (HCC)    Depression 08/06/1981   Gluten intolerance    Oxygen deficiency    Pneumonia     Past Surgical History:  Procedure Laterality Date   BACK SURGERY  2015   BACK SURGERY     INGUINAL HERNIA REPAIR Bilateral    multiple times   lumbar discetomy  2005   MOLE REMOVAL     NECK SURGERY  2016   SPINE SURGERY  12/15/01    Prior to Admission medications   Medication Sig Start Date End Date Taking? Authorizing Provider  albuterol (VENTOLIN HFA) 108 (90 Base) MCG/ACT inhaler Inhale 2 puffs into the lungs every 6 (six) hours as needed for wheezing or shortness of breath. 11/02/20  Yes Berniece Salines, FNP  Ascorbic Acid (VITAMIN C PO) Take by mouth.   Yes [provider]  DULoxetine (CYMBALTA) 60 MG capsule Take 1 capsule (60 mg total) by mouth daily. 07/07/22  Yes Sowles, Danna Hefty, MD  ezetimibe (ZETIA) 10 MG tablet Take 1 tablet (10 mg total) by mouth daily. 05/13/22  Yes Sowles, Danna Hefty, MD  hydrOXYzine (ATARAX) 10 MG tablet TAKE 1 TABLET(10 MG) BY MOUTH THREE TIMES DAILY AS NEEDED 05/13/22  Yes Sowles, Danna Hefty, MD  L-Citrulline POWD by Does not apply route daily.   Yes [provider]  MAGNESIUM PO Take by mouth.   Yes [provider]  Misc Natural Products (MAGIC MUSHROOM MIX) CAPS Take 2 capsules by mouth daily.   Yes [provider]  MORINGA OLEIFERA PO Take 1,200 mg by mouth daily at 12 noon.   Yes [provider]  naproxen (NAPROSYN) 500 MG tablet Take 500 mg by mouth 2 (two) times daily as needed. 02/01/21  Yes Lou Cal, MD  pregabalin (LYRICA) 100 MG capsule Take 1 capsule (100 mg total) by mouth 2 (two) times daily. 05/03/21  Yes Sowles, Danna Hefty, MD  rizatriptan (MAXALT) 10 MG tablet Take 1 tablet (10 mg total) by mouth as needed for migraine. May repeat in 2 hours if needed 01/02/20  Yes Sowles, Danna Hefty, MD  saw palmetto 80 MG capsule Take 80 mg by mouth daily.   Yes [provider]  testosterone (ANDROGEL) 50 MG/5GM (1%) GEL Place 5 g onto the skin daily. 07/16/22  Yes Sowles, Danna Hefty, MD  TWYNEO 0.1-3 % CREA Apply 1 application topically daily. PRN 12/25/20  Yes [provider]  zinc gluconate 50 MG tablet Take 50 mg by mouth daily.   Yes [provider]  methylPREDNISolone (MEDROL DOSEPAK) 4 MG TBPK tablet Day 1: Take 8 mg (2 tablets) before breakfast, 4 mg (1 tablet) after lunch, 4 mg (1 tablet) after supper, and 8 mg (2 tablets) at bedtime. Day 2:Take 4 mg (1 tablet) before breakfast, 4 mg (1 tablet) after lunch, 4 mg (1 tablet) after supper, and 8 mg (2 tablets) at bedtime. Day 3: Take 4 mg (1 tablet) before breakfast, 4 mg (1 tablet) after lunch, 4 mg (1 tablet) after supper, and 4 mg (1 tablet) at bedtime. Day 4: Take 4 mg (1 tablet) before  breakfast, 4 mg (1 tablet) after lunch, and 4 mg (1 tablet) at bedtime. Day 5: Take 4 mg (1 tablet) before breakfast and 4 mg (1 tablet) at bedtime. Day 6: Take 4 mg (1 tablet) before breakfast. Patient not taking: Reported on 09/09/2022 08/08/22   Margarita Mail, DO  tiZANidine (ZANAFLEX) 4 MG tablet Take 1 tablet (4 mg total) by mouth at bedtime. Patient not taking: Reported on 09/09/2022 06/29/17   Alba Cory, MD    Allergies as of 08/12/2022 - Review Complete 08/12/2022  Allergen Reaction Noted   Statins Other (See Comments) 02/05/2021   Gluten meal  10/12/2017    Family History  Problem Relation Age of Onset   Hypertension Father    Lung disease Father    Hypertension Brother    Lung cancer Maternal Grandfather     Social  History   Socioeconomic History   Marital status: Significant Other    Spouse name: Duncan Dull   Number of children: 4   Years of education: Not on file   Highest education level: Associate degree: occupational, Scientist, product/process development, or vocational program  Occupational History   Occupation: disbaled     Comment: Loss adjuster, [employer]   Tobacco Use   Smoking status: Former    Current packs/day: 0.00    Average packs/day: 1 pack/day for 42.0 years (42.0 ttl pk-yrs)    Types: Cigarettes    Start date: 89    Quit date: 12/13/2011    Years since quitting: 10.7   Smokeless tobacco: Never   Tobacco comments:    09/09/22 - may have occasional cigar  Vaping Use   Vaping status: Never Used  Substance and Sexual Activity   Alcohol use: Not Currently    Comment: quit 5-6 years ago, only beer - but glutten free diet now   Drug use: Not Currently    Types: "Crack" cocaine, Cocaine, Heroin, Marijuana, Other-see comments    Comment: meths in his 20's-30's (No illicit drugs in 25-30 yrs)   Sexual activity: Yes    Partners: Female  Other Topics Concern   Not on file  Social History Narrative   Approved for disability 06/2017 ( workman's comp back 2014) , secondary to neck and back injury    Social Determinants of Health   Financial Resource Strain: Low Risk  (07/03/2022)   Overall Financial Resource Strain (CARDIA)    Difficulty of Paying Living Expenses: Not very hard  Food Insecurity: Food Insecurity Present (07/03/2022)   Hunger Vital Sign    Worried About Running Out of Food in the Last Year: Sometimes true    Ran Out of Food in the Last Year: Sometimes true  Transportation Needs: No Transportation Needs (07/03/2022)   PRAPARE - Administrator, Civil Service (Medical): No    Lack of Transportation (Non-Medical): No  Physical Activity: Insufficiently Active (07/03/2022)   Exercise Vital Sign    Days of Exercise per Week: 1 day    Minutes of Exercise per Session: 10  min  Stress: Stress Concern Present (07/03/2022)   [employer] of Occupational Health - Occupational Stress Questionnaire    Feeling of Stress : To some extent  Social Connections: Moderately Isolated (07/03/2022)   Social Connection and Isolation Panel [NHANES]    Frequency of Communication with Friends and Family: Three times a week    Frequency of Social Gatherings with Friends and Family: Twice a week    Attends Religious Services: Never    Active  Member of Clubs or Organizations: No    Attends Banker Meetings: Never    Marital Status: Living with partner  Intimate Partner Violence: Not At Risk (08/13/2022)   Humiliation, Afraid, Rape, and Kick questionnaire    Fear of Current or Ex-Partner: No    Emotionally Abused: No    Physically Abused: No    Sexually Abused: No    Review of Systems: See HPI, otherwise negative ROS  Physical Exam: BP (!) 121/91   Temp 98.4 F (36.9 C) (Temporal)   Resp 16   Ht 6' (1.829 m)   Wt 91.2 kg   SpO2 97%   BMI 27.26 kg/m  General:   Alert,  pleasant and cooperative in NAD Head:  Normocephalic and atraumatic. Neck:  Supple; no masses or thyromegaly. Lungs:  Clear throughout to auscultation.    Heart:  Regular rate and rhythm. Abdomen:  Soft, nontender and nondistended. Normal bowel sounds, without guarding, and without rebound.   Neurologic:  Alert and  oriented x4;  grossly normal neurologically.  Impression/Plan: Livio Ledwith is here for an colonoscopy to be performed for positive cologard  Risks, benefits, limitations, and alternatives regarding  colonoscopy have been reviewed with the patient.  Questions have been answered.  All parties agreeable.   [name], MD  09/15/2022, 10:00 AM

## 2022-09-15 NOTE — Op Note (Signed)
[facility] Gastroenterology Patient Name: Ronald Ellis Procedure Date: 09/15/2022 10:03 AM MRN: [id number] Account #: [id number] Date of Birth: [birth year] Admit Type: Outpatient Age: 62 Room: [facility] OR ROOM 01 Gender: Male Note Status: Finalized Instrument Name: [id number] Procedure:             Colonoscopy Indications:           Positive Cologuard test Providers:             [name] MD, MD Referring MD:          Onnie Boer. Sowles, MD (Referring MD) Medicines:             Propofol per Anesthesia Complications:         No immediate complications. Procedure:             Pre-Anesthesia Assessment:                        - Prior to the procedure, a History and Physical was                         performed, and patient medications and allergies were                         reviewed. The patient's tolerance of previous                         anesthesia was also reviewed. The risks and benefits                         of the procedure and the sedation options and risks                         were discussed with the patient. All questions were                         answered, and informed consent was obtained. Prior                         Anticoagulants: The patient has taken no anticoagulant                         or antiplatelet agents. ASA Grade Assessment: II - A                         patient with mild systemic disease. After reviewing                         the risks and benefits, the patient was deemed in                         satisfactory condition to undergo the procedure.                        After obtaining informed consent, the colonoscope was                         passed under direct vision. Throughout the procedure,  the patient's blood pressure, pulse, and oxygen                         saturations were monitored continuously. The                         Colonoscope was introduced through the anus and                          advanced to the the cecum, identified by appendiceal                         orifice and ileocecal valve. The colonoscopy was                         performed without difficulty. The patient tolerated                         the procedure well. The quality of the bowel                         preparation was excellent. Findings:      The perianal and digital rectal examinations were normal.      A 9 mm polyp was found in the sigmoid colon. The polyp was pedunculated.       The polyp was removed with a hot snare. Resection and retrieval were       complete.      A 4 mm polyp was found in the sigmoid colon. The polyp was sessile. The       polyp was removed with a cold snare. Resection and retrieval were       complete.      A 4 mm polyp was found in the ascending colon. The polyp was sessile.       The polyp was removed with a cold snare. Resection and retrieval were       complete.      A 3 mm polyp was found in the descending colon. The polyp was sessile.       The polyp was removed with a cold snare. Resection and retrieval were       complete.      Multiple small-mouthed diverticula were found in the sigmoid colon. Impression:            - One 9 mm polyp in the sigmoid colon, removed with a                         hot snare. Resected and retrieved.                        - One 4 mm polyp in the sigmoid colon, removed with a                         cold snare. Resected and retrieved.                        - One 4 mm polyp in the ascending colon, removed with                         a  cold snare. Resected and retrieved.                        - One 3 mm polyp in the descending colon, removed with                         a cold snare. Resected and retrieved.                        - Diverticulosis in the sigmoid colon. Recommendation:        - Discharge patient to home.                        - Resume previous diet.                        - Continue present medications.                         - Await pathology results.                        - If the pathology report reveals adenomatous tissue,                         then repeat the colonoscopy for surveillance in 3                         years. Procedure Code(s):     --- Professional ---                        [phone], Colonoscopy, flexible; with removal of                         tumor(s), polyp(s), or other lesion(s) by snare                         technique Diagnosis Code(s):     --- Professional ---                        R19.5, Other fecal abnormalities                        D12.5, Benign neoplasm of sigmoid colon CPT copyright 2022 American Medical Association. All rights reserved. The codes documented in this report are preliminary and upon coder review may  be revised to meet current compliance requirements. [name] MD, MD 09/15/2022 10:36:24 AM This report has been signed electronically. Number of Addenda: 0 Note Initiated On: 09/15/2022 10:03 AM Scope Withdrawal Time: 0 hours 11 minutes 43 seconds  Total Procedure Duration: 0 hours 18 minutes 41 seconds  Estimated Blood Loss:  Estimated blood loss: none.      [facility]

## 2022-09-15 NOTE — Anesthesia Postprocedure Evaluation (Signed)
Anesthesia Post Note  Patient: Ronald Ellis  Procedure(s) Performed: COLONOSCOPY WITH PROPOFOL (Rectum)  Patient location during evaluation: PACU Anesthesia Type: General Level of consciousness: awake and alert Pain management: pain level controlled Vital Signs Assessment: post-procedure vital signs reviewed and stable Respiratory status: spontaneous breathing, nonlabored ventilation, respiratory function stable and patient connected to nasal cannula oxygen Cardiovascular status: blood pressure returned to baseline and stable Postop Assessment: no apparent nausea or vomiting Anesthetic complications: no   No notable events documented.   Last Vitals:  Vitals:   09/15/22 1045 09/15/22 1051  BP: 106/71 (!) 123/90  Pulse: (!) 59 61  Resp: (!) 23 18  Temp:  36.4 C  SpO2: 96% 97%    Last Pain:  Vitals:   09/15/22 1051  TempSrc:   PainSc: 0-No pain                 Dejon Lukas C [place]

## 2022-09-16 ENCOUNTER — Encounter: Payer: Self-pay | Admitting: Gastroenterology

## 2022-09-18 DIAGNOSIS — M5126 Other intervertebral disc displacement, lumbar region: Secondary | ICD-10-CM | POA: Diagnosis not present

## 2022-09-18 DIAGNOSIS — M47896 Other spondylosis, lumbar region: Secondary | ICD-10-CM | POA: Diagnosis not present

## 2022-09-18 DIAGNOSIS — M5416 Radiculopathy, lumbar region: Secondary | ICD-10-CM | POA: Diagnosis not present

## 2022-09-20 ENCOUNTER — Encounter: Payer: Self-pay | Admitting: Gastroenterology

## 2022-09-26 ENCOUNTER — Other Ambulatory Visit: Payer: Self-pay | Admitting: Family Medicine

## 2022-09-26 ENCOUNTER — Encounter: Payer: Self-pay | Admitting: Gastroenterology

## 2022-09-26 DIAGNOSIS — R7989 Other specified abnormal findings of blood chemistry: Secondary | ICD-10-CM

## 2022-09-26 NOTE — Addendum Note (Signed)
Addendum  created 09/26/22 1316 by Domenic Moras, CRNA   Intraprocedure Event edited

## 2022-09-29 ENCOUNTER — Other Ambulatory Visit: Payer: Self-pay

## 2022-09-29 DIAGNOSIS — R7989 Other specified abnormal findings of blood chemistry: Secondary | ICD-10-CM

## 2022-09-29 NOTE — Telephone Encounter (Signed)
Requested medication (s) are due for refill today: yes  Requested medication (s) are on the active medication list: yes  Last refill:  07/16/22  Future visit scheduled: yes  Notes to clinic:   Medication not assigned to a protocol, review manually.      Requested Prescriptions  Pending Prescriptions Disp Refills   testosterone (ANDROGEL) 50 MG/5GM (1%) GEL [Pharmacy Med Name: TESTOSTERONE 1%(50MG ) GEL 5GM UDT] 150 g     Sig: PLACE 5 GRAMS ONTO THE SKIN DAILY.     Off-Protocol Failed - 09/26/2022  6:26 PM      Failed - Medication not assigned to a protocol, review manually.      Passed - Valid encounter within last 12 months    Recent Outpatient Visits           1 month ago Well adult exam   [facility] Alba Cory, MD   1 month ago Acute non-recurrent maxillary sinusitis   [facility] Margarita Mail, DO   2 months ago Moderate major depression [facility])   [facility] Alba Cory, MD   4 months ago Moderate major depression [facility])   [facility] Alba Cory, MD   5 months ago Dysuria   [facility] Alba Cory, MD       Future Appointments             In 1 month Carlynn Purl, Danna Hefty, MD [facility]   In 4 months  [facility]   In 10 months Alba Cory, MD [facility]

## 2022-10-02 DIAGNOSIS — M5451 Vertebrogenic low back pain: Secondary | ICD-10-CM | POA: Diagnosis not present

## 2022-10-02 DIAGNOSIS — M5416 Radiculopathy, lumbar region: Secondary | ICD-10-CM | POA: Diagnosis not present

## 2022-10-07 DIAGNOSIS — M5451 Vertebrogenic low back pain: Secondary | ICD-10-CM | POA: Diagnosis not present

## 2022-10-07 DIAGNOSIS — M5416 Radiculopathy, lumbar region: Secondary | ICD-10-CM | POA: Diagnosis not present

## 2022-10-13 DIAGNOSIS — M5416 Radiculopathy, lumbar region: Secondary | ICD-10-CM | POA: Diagnosis not present

## 2022-10-13 DIAGNOSIS — M5451 Vertebrogenic low back pain: Secondary | ICD-10-CM | POA: Diagnosis not present

## 2022-10-24 DIAGNOSIS — M5416 Radiculopathy, lumbar region: Secondary | ICD-10-CM | POA: Diagnosis not present

## 2022-10-24 DIAGNOSIS — M5451 Vertebrogenic low back pain: Secondary | ICD-10-CM | POA: Diagnosis not present

## 2022-10-30 DIAGNOSIS — M5416 Radiculopathy, lumbar region: Secondary | ICD-10-CM | POA: Diagnosis not present

## 2022-10-30 DIAGNOSIS — M5451 Vertebrogenic low back pain: Secondary | ICD-10-CM | POA: Diagnosis not present

## 2022-11-13 DIAGNOSIS — M5416 Radiculopathy, lumbar region: Secondary | ICD-10-CM | POA: Diagnosis not present

## 2022-11-13 NOTE — Progress Notes (Deleted)
Name: Ronald Ellis   MRN: [id number]    DOB: 1960-08-09   Date:11/13/2022       Progress Note  Subjective  Chief Complaint  Follow Up  HPI  Intermittent chest pain: seen by Dr. Okey Dupre,, seems to be related to episodes of bronchitis - feels tight, he has sob but likely from emphysema he stopped taking Atorvastatin because it caused muscle aches  but is taking zetia now   Stress Myoview done 08/05/2019 :     There was no ST segment deviation noted during stress.   No T wave inversion was noted during stress.   The study is normal.   This is a low risk study.   The left ventricular ejection fraction is normal (55-65%).   Suboptimal study due to GI.    Echocardiogram done 08/04/2019  1. Left ventricular ejection fraction, by estimation, is 65 to 70%. The left ventricle has normal function. The left ventricle has no regional wall motion abnormalities. Left ventricular diastolic parameters were normal. 2. Right ventricular systolic function is normal. The right ventricular size is normal. There is normal pulmonary artery systolic pressure. 3. The mitral valve is normal in structure. No evidence of mitral valve regurgitation. No evidence of mitral stenosis. 4. The aortic valve is normal in structure. Aortic valve regurgitation is not visualized. No aortic stenosis is present. 5. The inferior vena cava is normal in size with greater than 50% respiratory variability, suggesting right atrial pressure of 3 mmHg    Emphysema: he smoked cigarettes for about 40 years one pack daily but quit in 2013 , he quit smoking cigars in 2020.Marland Kitchen He was seen by Dr. Belia Heman. He is returned his oxygen tanks, he also only takes Breo prn, he had COVID a few weeks ago and had more coughing and had to use it again. He has a baseline cough, but no wheezing , sob only moderate activity   IMPRESSION:05/01/2022  1. Lung-RADS 2, benign appearance or behavior. Continue annual screening with low-dose chest CT without  contrast in 12 months. 2. Basilar predominant subpleural coarsened ground-glass, subpleural reticulation and traction bronchiectasis/bronchiolectasis, similar to minimally progressive from 04/26/2021. Findings may be due to usual interstitial pneumonitis or fibrotic nonspecific interstitial pneumonitis. 3.  Emphysema (ICD10-J43.9).  Melanoma in situ 2021 : seeing Dr. Cheree Ditto, removed and had Mohl't surgery on his neck . He is up to date with follow ups, still going every 6 months    Headache: improved, however still takes Advil in am's for body aches. He states migraine headaches back to about once a week and resolves when he takes maxalt - but can last up to 6 hours  it was associated with nausea, throbbing pain, behind right eye and frontal area, he states pain causes vomiting at times, he states maxalt decreases the intensity down to a minimum and he is able to function. He thinks secondary to DDD of cervical spine, he took topamax in the past but not sure why he stopped Episodes down to about at most twice a month    Chronic neck and back pain: . He is still seeing Ortho, had some steroid injections by Dr. Shanon Rosser takes medication prn, he had an ablation on lumbar spine and the pain on his back is not as bad but still has radiculitis . Pain on her back is 5-6/10     MDD/seasonal affective disorder: he decided to stop Abilify months ago and is doing well on duloxetine and hdyroxizine prn, usually in am's he stopped  trazodone but sleeps well at night. His anger is under control. Stable   History of drug use: when he was an young adult, he got married at age 62 , had 2 kids, when he got a divorce at age 62 , he went wild , he did drugs until age 62 he states the reason he quit was because he did a big snort of cocaine, he fell forward and hit his head , he had a laceration and decided not to do it again. He has been doing well, still in remission  Unchanged   Atherosclerosis of aorta: he tried  atorvastatin but caused body aches, also failed Crestor, he is taking Zetia now and tolerating it well, we will recheck labs today   Other Fatigue: he is worried about low testosterone, he is not sexually active - wife has vaginal dryness   Patient Active Problem List   Diagnosis Date Noted   Positive colorectal cancer screening using Cologuard test 09/15/2022   Polyp of sigmoid colon 09/15/2022   Emphysema lung (HCC) 08/12/2021   Moderate major depression (HCC) 05/03/2021   Nocturnal hypoxia 06/25/2020   Migraine without aura and without status migrainosus, not intractable 05/02/2020   Seasonal affective disorder (HCC) 05/02/2020   Chest pain of uncertain etiology 06/24/2019   Atherosclerosis of aorta (HCC) 12/17/2018   Personal history of tobacco use, presenting hazards to health 12/16/2018   History of drug dependence/abuse (HCC) 11/26/2018   Celiac disease 02/05/2018   Right-sided low back pain with right-sided sciatica 12/05/2015   Plantar fasciitis of right foot 10/15/2015   Chronic neck and back pain 07/19/2015    Past Surgical History:  Procedure Laterality Date   BACK SURGERY  2015   BACK SURGERY     COLONOSCOPY WITH PROPOFOL N/A 09/15/2022   Procedure: COLONOSCOPY WITH PROPOFOL;  Surgeon: [name], MD;  Location: [facility];  Service: Endoscopy;  Laterality: N/A;   INGUINAL HERNIA REPAIR Bilateral    multiple times   lumbar discetomy  2005   MOLE REMOVAL     NECK SURGERY  2016   SPINE SURGERY  12/15/01    Family History  Problem Relation Age of Onset   Hypertension Father    Lung disease Father    Hypertension Brother    Lung cancer Maternal Grandfather     Social History   Tobacco Use   Smoking status: Former    Current packs/day: 0.00    Average packs/day: 1 pack/day for 42.0 years (42.0 ttl pk-yrs)    Types: Cigarettes    Start date: 47    Quit date: 12/13/2011    Years since quitting: 10.9   Smokeless tobacco: Never   Tobacco  comments:    09/09/22 - may have occasional cigar  Substance Use Topics   Alcohol use: Not Currently    Comment: quit 5-6 years ago, only beer - but glutten free diet now     Current Outpatient Medications:    albuterol (VENTOLIN HFA) 108 (90 Base) MCG/ACT inhaler, Inhale 2 puffs into the lungs every 6 (six) hours as needed for wheezing or shortness of breath., Disp: 18 g, Rfl: 0   Ascorbic Acid (VITAMIN C PO), Take by mouth., Disp: , Rfl:    DULoxetine (CYMBALTA) 60 MG capsule, Take 1 capsule (60 mg total) by mouth daily., Disp: 90 capsule, Rfl: 0   ezetimibe (ZETIA) 10 MG tablet, Take 1 tablet (10 mg total) by mouth daily., Disp: 90 tablet, Rfl: 3   hydrOXYzine (  ATARAX) 10 MG tablet, TAKE 1 TABLET(10 MG) BY MOUTH THREE TIMES DAILY AS NEEDED, Disp: 90 tablet, Rfl: 1   L-Citrulline POWD, by Does not apply route daily., Disp: , Rfl:    MAGNESIUM PO, Take by mouth., Disp: , Rfl:    methylPREDNISolone (MEDROL DOSEPAK) 4 MG TBPK tablet, Day 1: Take 8 mg (2 tablets) before breakfast, 4 mg (1 tablet) after lunch, 4 mg (1 tablet) after supper, and 8 mg (2 tablets) at bedtime. Day 2:Take 4 mg (1 tablet) before breakfast, 4 mg (1 tablet) after lunch, 4 mg (1 tablet) after supper, and 8 mg (2 tablets) at bedtime. Day 3: Take 4 mg (1 tablet) before breakfast, 4 mg (1 tablet) after lunch, 4 mg (1 tablet) after supper, and 4 mg (1 tablet) at bedtime. Day 4: Take 4 mg (1 tablet) before breakfast, 4 mg (1 tablet) after lunch, and 4 mg (1 tablet) at bedtime. Day 5: Take 4 mg (1 tablet) before breakfast and 4 mg (1 tablet) at bedtime. Day 6: Take 4 mg (1 tablet) before breakfast. (Patient not taking: Reported on 09/09/2022), Disp: 1 each, Rfl: 0   Misc Natural Products (MAGIC MUSHROOM MIX) CAPS, Take 2 capsules by mouth daily., Disp: , Rfl:    MORINGA OLEIFERA PO, Take 1,200 mg by mouth daily at 12 noon., Disp: , Rfl:    naproxen (NAPROSYN) 500 MG tablet, Take 500 mg by mouth 2 (two) times daily as needed., Disp: ,  Rfl:    pregabalin (LYRICA) 100 MG capsule, Take 1 capsule (100 mg total) by mouth 2 (two) times daily., Disp: 180 capsule, Rfl: 1   rizatriptan (MAXALT) 10 MG tablet, Take 1 tablet (10 mg total) by mouth as needed for migraine. May repeat in 2 hours if needed, Disp: 10 tablet, Rfl: 1   saw palmetto 80 MG capsule, Take 80 mg by mouth daily., Disp: , Rfl:    testosterone (ANDROGEL) 50 MG/5GM (1%) GEL, PLACE 5 GRAMS ONTO THE SKIN DAILY., Disp: 150 g, Rfl: 2   tiZANidine (ZANAFLEX) 4 MG tablet, Take 1 tablet (4 mg total) by mouth at bedtime. (Patient not taking: Reported on 09/09/2022), Disp: 90 tablet, Rfl: 1   TWYNEO 0.1-3 % CREA, Apply 1 application topically daily. PRN, Disp: , Rfl:    zinc gluconate 50 MG tablet, Take 50 mg by mouth daily., Disp: , Rfl:   Allergies  Allergen Reactions   Statins Other (See Comments)    Fever, chills, body aches   Gluten Meal Diarrhea and Nausea Only    I personally reviewed active problem list, medication list, allergies, family history, social history, health maintenance with the patient/caregiver today.   ROS  ***  Objective  There were no vitals filed for this visit.  There is no height or weight on file to calculate BMI.  Physical Exam ***  No results found for this or any previous visit (from the past 2160 hour(s)).   PHQ2/9:    08/13/2022   11:26 AM 08/08/2022    2:17 PM 07/07/2022    2:00 PM 05/13/2022   11:17 AM 04/08/2022    9:29 AM  Depression screen PHQ 2/9  Decreased Interest 0 0 1 2 0  Down, Depressed, Hopeless 0 0 1 1 0  PHQ - 2 Score 0 0 2 3 0  Altered sleeping  0 1 1 0  Tired, decreased energy  0 3 1 0  Change in appetite  0 0 1 0  Feeling bad or failure  about yourself   0 0 0 0  Trouble concentrating  0 0 0 0  Moving slowly or fidgety/restless  0 0 0 0  Suicidal thoughts  0 0 0 0  PHQ-9 Score  0 6 6 0  Difficult doing work/chores  Not difficult at all  Not difficult at all Not difficult at all    phq 9 is {gen pos  [id number]}   Fall Risk:    08/13/2022   11:26 AM 08/08/2022    2:17 PM 07/07/2022    1:59 PM 05/13/2022   11:17 AM 04/08/2022    9:29 AM  Fall Risk   Falls in the past year? 0 0 0 0 0  Number falls in past yr: 0 0 0  0  Injury with Fall? 0 0 0  0  Risk for fall due to : No Fall Risks  No Fall Risks No Fall Risks No Fall Risks  Follow up Falls prevention discussed  Falls prevention discussed Falls prevention discussed Falls prevention discussed;Education provided;Falls evaluation completed      Functional Status Survey:      Assessment & Plan  *** There are no diagnoses linked to this encounter.

## 2022-11-14 ENCOUNTER — Ambulatory Visit: Payer: Medicare Other | Admitting: Family Medicine

## 2022-11-18 DIAGNOSIS — Z01812 Encounter for preprocedural laboratory examination: Secondary | ICD-10-CM | POA: Diagnosis not present

## 2022-11-18 DIAGNOSIS — M5116 Intervertebral disc disorders with radiculopathy, lumbar region: Secondary | ICD-10-CM | POA: Diagnosis not present

## 2022-11-18 DIAGNOSIS — M4726 Other spondylosis with radiculopathy, lumbar region: Secondary | ICD-10-CM | POA: Diagnosis not present

## 2022-11-18 DIAGNOSIS — M48061 Spinal stenosis, lumbar region without neurogenic claudication: Secondary | ICD-10-CM | POA: Diagnosis not present

## 2022-11-20 DIAGNOSIS — M5126 Other intervertebral disc displacement, lumbar region: Secondary | ICD-10-CM | POA: Diagnosis not present

## 2022-11-20 DIAGNOSIS — M48061 Spinal stenosis, lumbar region without neurogenic claudication: Secondary | ICD-10-CM | POA: Diagnosis not present

## 2022-11-20 DIAGNOSIS — M5416 Radiculopathy, lumbar region: Secondary | ICD-10-CM | POA: Diagnosis not present

## 2022-11-20 DIAGNOSIS — M47816 Spondylosis without myelopathy or radiculopathy, lumbar region: Secondary | ICD-10-CM | POA: Diagnosis not present

## 2022-11-24 DIAGNOSIS — M5116 Intervertebral disc disorders with radiculopathy, lumbar region: Secondary | ICD-10-CM | POA: Diagnosis not present

## 2022-11-24 DIAGNOSIS — M5126 Other intervertebral disc displacement, lumbar region: Secondary | ICD-10-CM | POA: Diagnosis not present

## 2022-11-24 DIAGNOSIS — G8929 Other chronic pain: Secondary | ICD-10-CM | POA: Diagnosis not present

## 2022-11-24 DIAGNOSIS — Z87891 Personal history of nicotine dependence: Secondary | ICD-10-CM | POA: Diagnosis not present

## 2022-11-24 DIAGNOSIS — G894 Chronic pain syndrome: Secondary | ICD-10-CM | POA: Diagnosis not present

## 2022-11-24 DIAGNOSIS — M48062 Spinal stenosis, lumbar region with neurogenic claudication: Secondary | ICD-10-CM | POA: Diagnosis not present

## 2022-11-24 DIAGNOSIS — M5416 Radiculopathy, lumbar region: Secondary | ICD-10-CM | POA: Diagnosis not present

## 2022-11-24 DIAGNOSIS — J449 Chronic obstructive pulmonary disease, unspecified: Secondary | ICD-10-CM | POA: Diagnosis not present

## 2022-11-24 DIAGNOSIS — M48061 Spinal stenosis, lumbar region without neurogenic claudication: Secondary | ICD-10-CM | POA: Diagnosis not present

## 2022-11-24 DIAGNOSIS — Z472 Encounter for removal of internal fixation device: Secondary | ICD-10-CM | POA: Diagnosis not present

## 2022-11-24 DIAGNOSIS — Z981 Arthrodesis status: Secondary | ICD-10-CM | POA: Diagnosis not present

## 2022-11-25 DIAGNOSIS — Z87891 Personal history of nicotine dependence: Secondary | ICD-10-CM | POA: Diagnosis not present

## 2022-11-25 DIAGNOSIS — M5116 Intervertebral disc disorders with radiculopathy, lumbar region: Secondary | ICD-10-CM | POA: Diagnosis not present

## 2022-11-25 DIAGNOSIS — J449 Chronic obstructive pulmonary disease, unspecified: Secondary | ICD-10-CM | POA: Diagnosis not present

## 2022-11-25 DIAGNOSIS — M48061 Spinal stenosis, lumbar region without neurogenic claudication: Secondary | ICD-10-CM | POA: Diagnosis not present

## 2022-11-25 DIAGNOSIS — G8929 Other chronic pain: Secondary | ICD-10-CM | POA: Diagnosis not present

## 2022-11-25 DIAGNOSIS — Z981 Arthrodesis status: Secondary | ICD-10-CM | POA: Diagnosis not present

## 2022-12-10 DIAGNOSIS — Z4889 Encounter for other specified surgical aftercare: Secondary | ICD-10-CM | POA: Diagnosis not present

## 2022-12-22 DIAGNOSIS — M5451 Vertebrogenic low back pain: Secondary | ICD-10-CM | POA: Diagnosis not present

## 2022-12-22 DIAGNOSIS — M5416 Radiculopathy, lumbar region: Secondary | ICD-10-CM | POA: Diagnosis not present

## 2023-01-06 ENCOUNTER — Telehealth: Payer: Self-pay | Admitting: Family Medicine

## 2023-01-06 NOTE — Telephone Encounter (Signed)
Copied from CRM [id number]. Topic: General - Other >> Jan 06, 2023 12:00 PM Dominique E wrote: Reason for CRM: Pt called requesting his annual CT scan for a former smoker

## 2023-01-06 NOTE — Telephone Encounter (Signed)
Ordered in march

## 2023-01-09 DIAGNOSIS — Z4889 Encounter for other specified surgical aftercare: Secondary | ICD-10-CM | POA: Diagnosis not present

## 2023-01-12 DIAGNOSIS — Z85828 Personal history of other malignant neoplasm of skin: Secondary | ICD-10-CM | POA: Diagnosis not present

## 2023-01-12 DIAGNOSIS — L578 Other skin changes due to chronic exposure to nonionizing radiation: Secondary | ICD-10-CM | POA: Diagnosis not present

## 2023-01-12 DIAGNOSIS — L918 Other hypertrophic disorders of the skin: Secondary | ICD-10-CM | POA: Diagnosis not present

## 2023-01-12 DIAGNOSIS — L700 Acne vulgaris: Secondary | ICD-10-CM | POA: Diagnosis not present

## 2023-01-12 DIAGNOSIS — D239 Other benign neoplasm of skin, unspecified: Secondary | ICD-10-CM | POA: Diagnosis not present

## 2023-01-12 DIAGNOSIS — Z85820 Personal history of malignant melanoma of skin: Secondary | ICD-10-CM | POA: Diagnosis not present

## 2023-01-12 DIAGNOSIS — D492 Neoplasm of unspecified behavior of bone, soft tissue, and skin: Secondary | ICD-10-CM | POA: Diagnosis not present

## 2023-01-12 DIAGNOSIS — Z86018 Personal history of other benign neoplasm: Secondary | ICD-10-CM | POA: Diagnosis not present

## 2023-01-12 DIAGNOSIS — Z872 Personal history of diseases of the skin and subcutaneous tissue: Secondary | ICD-10-CM | POA: Diagnosis not present

## 2023-01-21 DIAGNOSIS — M5416 Radiculopathy, lumbar region: Secondary | ICD-10-CM | POA: Diagnosis not present

## 2023-01-21 DIAGNOSIS — M5451 Vertebrogenic low back pain: Secondary | ICD-10-CM | POA: Diagnosis not present

## 2023-02-12 ENCOUNTER — Ambulatory Visit: Payer: Medicare Other

## 2023-02-18 DIAGNOSIS — M5416 Radiculopathy, lumbar region: Secondary | ICD-10-CM | POA: Diagnosis not present

## 2023-02-18 DIAGNOSIS — M5451 Vertebrogenic low back pain: Secondary | ICD-10-CM | POA: Diagnosis not present

## 2023-04-20 ENCOUNTER — Other Ambulatory Visit: Payer: Self-pay | Admitting: Family Medicine

## 2023-04-20 DIAGNOSIS — R7989 Other specified abnormal findings of blood chemistry: Secondary | ICD-10-CM

## 2023-05-07 ENCOUNTER — Other Ambulatory Visit: Payer: Self-pay | Admitting: Acute Care

## 2023-05-07 DIAGNOSIS — Z87891 Personal history of nicotine dependence: Secondary | ICD-10-CM

## 2023-05-07 DIAGNOSIS — Z122 Encounter for screening for malignant neoplasm of respiratory organs: Secondary | ICD-10-CM

## 2023-05-14 ENCOUNTER — Ambulatory Visit
Admission: RE | Admit: 2023-05-14 | Discharge: 2023-05-14 | Disposition: A | Discharge status: Home / Self Care | Source: Ambulatory Visit | Attending: Family Medicine | Admitting: Family Medicine

## 2023-05-14 DIAGNOSIS — Z87891 Personal history of nicotine dependence: Secondary | ICD-10-CM | POA: Diagnosis present

## 2023-05-14 DIAGNOSIS — Z122 Encounter for screening for malignant neoplasm of respiratory organs: Secondary | ICD-10-CM | POA: Insufficient documentation

## 2023-05-25 ENCOUNTER — Other Ambulatory Visit: Payer: Self-pay | Admitting: Family Medicine

## 2023-05-25 DIAGNOSIS — F321 Major depressive disorder, single episode, moderate: Secondary | ICD-10-CM

## 2023-05-25 NOTE — Telephone Encounter (Unsigned)
 Copied from CRM [id number]. Topic: Clinical - Medication Refill >> May 25, 2023  3:13 PM Fuller Mandril wrote: Most Recent Primary Care Visit:  Provider: Alba Cory  Department: [facility]  Visit Type: PHYSICAL  Date: 08/13/2022  Medication: DULoxetine (CYMBALTA) 60 MG capsule  Has the patient contacted their pharmacy? Yes (Agent: If no, request that the patient contact the pharmacy for the refill. If patient does not wish to contact the pharmacy document the reason why and proceed with request.) (Agent: If yes, when and what did the pharmacy advise?) unable to fill need new prescription  Is this the correct pharmacy for this prescription? Yes If no, delete pharmacy and type the correct one.  This is the patient's preferred pharmacy:  [id number] [place] - [address] [facility] [address] AT [facility] [address] [place] [address] Phone: [phone] Fax: [phone]   Has the prescription been filled recently? No  Is the patient out of the medication? Yes - only 1 left - pt states can not go with out medication or head start spinning   Has the patient been seen for an appointment in the last year OR does the patient have an upcoming appointment? Yes last 08/13/22 next scheduled on call for 1st available 4/29 and added to waitlist   Can we respond through MyChart? Yes  Agent: Please be advised that Rx refills may take up to 3 business days. We ask that you follow-up with your pharmacy.

## 2023-05-26 MED ORDER — DULOXETINE HCL 60 MG PO CPEP
60.0000 mg | ORAL_CAPSULE | Freq: Every day | ORAL | 0 refills | Status: DC
Start: 2023-05-26 — End: 2023-06-04

## 2023-05-26 NOTE — Telephone Encounter (Signed)
 Copied from CRM [id number]. Topic: Clinical - Prescription Issue >> May 26, 2023  2:44 PM Carlatta H wrote: Reason for CRM: Patient would like a call back from the nurse regarding his DULoxetine (CYMBALTA) 60 MG capsule [[id number]] prescription//

## 2023-05-26 NOTE — Telephone Encounter (Signed)
 Refill sent has appt 4/29

## 2023-06-04 ENCOUNTER — Ambulatory Visit (INDEPENDENT_AMBULATORY_CARE_PROVIDER_SITE_OTHER): Admitting: Family Medicine

## 2023-06-04 ENCOUNTER — Encounter: Payer: Self-pay | Admitting: Family Medicine

## 2023-06-04 VITALS — BP 122/82 | HR 96 | Resp 16 | Ht 72.0 in | Wt 203.8 lb

## 2023-06-04 DIAGNOSIS — F325 Major depressive disorder, single episode, in full remission: Secondary | ICD-10-CM | POA: Diagnosis not present

## 2023-06-04 DIAGNOSIS — R0683 Snoring: Secondary | ICD-10-CM

## 2023-06-04 DIAGNOSIS — R718 Other abnormality of red blood cells: Secondary | ICD-10-CM

## 2023-06-04 DIAGNOSIS — I700 Atherosclerosis of aorta: Secondary | ICD-10-CM

## 2023-06-04 DIAGNOSIS — G43001 Migraine without aura, not intractable, with status migrainosus: Secondary | ICD-10-CM

## 2023-06-04 DIAGNOSIS — R7989 Other specified abnormal findings of blood chemistry: Secondary | ICD-10-CM

## 2023-06-04 DIAGNOSIS — R399 Unspecified symptoms and signs involving the genitourinary system: Secondary | ICD-10-CM

## 2023-06-04 DIAGNOSIS — J432 Centrilobular emphysema: Secondary | ICD-10-CM | POA: Diagnosis not present

## 2023-06-04 DIAGNOSIS — F1921 Other psychoactive substance dependence, in remission: Secondary | ICD-10-CM

## 2023-06-04 DIAGNOSIS — E785 Hyperlipidemia, unspecified: Secondary | ICD-10-CM

## 2023-06-04 MED ORDER — DULOXETINE HCL 30 MG PO CPEP: 30.0000 mg | ORAL_CAPSULE | Freq: Every day | ORAL | 0 refills | Status: DC

## 2023-06-04 MED ORDER — TESTOSTERONE 50 MG/5GM (1%) TD GEL
5.0000 g | Freq: Every day | TRANSDERMAL | 1 refills | Status: DC
Start: 2023-06-04 — End: 2023-06-17

## 2023-06-04 NOTE — Progress Notes (Signed)
 Name: Ronald Ellis   MRN: [id number]    DOB: Jun 06, 1960   Date:06/04/2023       Progress Note  Subjective  Chief Complaint  Chief Complaint  Patient presents with   Medical Management of Chronic Issues   HPI   Intermittent chest pain: seen by Dr. Okey Dupre,, seems to be related to episodes of bronchitis - feels tight, he has sob but likely from emphysema he stopped taking Atorvastatin because it caused muscle aches, he recently stopped zetia, we will recheck labs today    Stress Myoview done 08/05/2019 :     There was no ST segment deviation noted during stress.   No T wave inversion was noted during stress.   The study is normal.   This is a low risk study.   The left ventricular ejection fraction is normal (55-65%).   Suboptimal study due to GI.     Echocardiogram done 08/04/2019   1. Left ventricular ejection fraction, by estimation, is 65 to 70%. The left ventricle has normal function. The left ventricle has no regional wall motion abnormalities. Left ventricular diastolic parameters were normal. 2. Right ventricular systolic function is normal. The right ventricular size is normal. There is normal pulmonary artery systolic pressure. 3. The mitral valve is normal in structure. No evidence of mitral valve regurgitation. No evidence of mitral stenosis. 4. The aortic valve is normal in structure. Aortic valve regurgitation is not visualized. No aortic stenosis is present. 5. The inferior vena cava is normal in size with greater than 50% respiratory variability, suggesting right atrial pressure of 3 mmHg     Emphysema: he smoked cigarettes for about 40 years one pack daily but quit in 2013 , he quit smoking cigars in 2020.Marland Kitchen He was seen by Dr. Belia Heman. He is returned his oxygen tanks, he is now off [place], he has albuterol but not using in a while. He recently had a lung cancer screen. He states also snores a lot and pauses during sleep. He would like to follow up with Dr. Belia Heman     IMPRESSION:05/01/2022  1. Lung-RADS 2, benign appearance or behavior. Continue annual screening with low-dose chest CT without contrast in 12 months. 2. Basilar predominant subpleural coarsened ground-glass, subpleural reticulation and traction bronchiectasis/bronchiolectasis, similar to minimally progressive from 04/26/2021. Findings may be due to usual interstitial pneumonitis or fibrotic nonspecific interstitial pneumonitis. 3.  Emphysema (ICD10-J43.9).   Melanoma in situ 2021 : seeing Dr. Cheree Ditto, removed and had Mohl't surgery on his neck . Up to date with visits with dermatologist    Headache: improved, however still takes Advil in am's for body aches. He still has maxalt at home, no recent flares  Chronic neck and back pain: History of neck fusion and neck pain is stable, he had a revision and fusion of lumbar spine done by Dr. Chalmers Guest 2024 He is off Lyrica and Tizanidine, feeling much better, no longer has radiculitis and pain is down to 1/10    MDD/seasonal affective disorder: he decided to stop Abilify months ago and is doing well on duloxetine , he already stopped taking hydroxizine and now wants to wean self off duloxetine. Did not like when he ran out of medication during recent trip. We will wean it slowly    History of drug use: when he was an young adult, he got married at age 58 , had 2 kids, when he got a divorce at age 87 , he went wild , he did drugs until age 33,  he states the reason he quit was because he did a big snort of cocaine, he fell forward and hit his head , he had a laceration and decided not to do it again. He has been doing well, still in remission  Unchanged  Atherosclerosis of aorta: he tried atorvastatin but caused body aches, also failed Crestor, he stopped Zetia  Low testosterone also some LUTS: he would like switch to injectables , we will get labs and refer him to Urologist     Patient Active Problem List   Diagnosis Date Noted   Positive  colorectal cancer screening using Cologuard test 09/15/2022   Polyp of sigmoid colon 09/15/2022   Emphysema lung (HCC) 08/12/2021   Moderate major depression (HCC) 05/03/2021   Nocturnal hypoxia 06/25/2020   Migraine without aura and without status migrainosus, not intractable 05/02/2020   Seasonal affective disorder (HCC) 05/02/2020   Chest pain of uncertain etiology 06/24/2019   Atherosclerosis of aorta (HCC) 12/17/2018   Personal history of tobacco use, presenting hazards to health 12/16/2018   History of drug dependence/abuse (HCC) 11/26/2018   Celiac disease 02/05/2018   Right-sided low back pain with right-sided sciatica 12/05/2015   Plantar fasciitis of right foot 10/15/2015   Chronic neck and back pain 07/19/2015    Past Surgical History:  Procedure Laterality Date   BACK SURGERY  2015   BACK SURGERY     COLONOSCOPY WITH PROPOFOL N/A 09/15/2022   Procedure: COLONOSCOPY WITH PROPOFOL;  Surgeon: [name], MD;  Location: [facility];  Service: Endoscopy;  Laterality: N/A;   INGUINAL HERNIA REPAIR Bilateral    multiple times   lumbar discetomy  2005   MOLE REMOVAL     NECK SURGERY  2016   SPINE SURGERY  12/15/01    Family History  Problem Relation Age of Onset   Hypertension Father    Lung disease Father    Hypertension Brother    Lung cancer Maternal Grandfather     Social History   Tobacco Use   Smoking status: Former    Current packs/day: 0.00    Average packs/day: 1 pack/day for 42.0 years (42.0 ttl pk-yrs)    Types: Cigarettes    Start date: 53    Quit date: 12/13/2011    Years since quitting: 11.4   Smokeless tobacco: Never   Tobacco comments:    09/09/22 - may have occasional cigar  Substance Use Topics   Alcohol use: Not Currently    Comment: quit 5-6 years ago, only beer - but glutten free diet now     Current Outpatient Medications:    Ascorbic Acid (VITAMIN C PO), Take by mouth., Disp: , Rfl:    DULoxetine (CYMBALTA) 60 MG capsule,  Take 1 capsule (60 mg total) by mouth daily., Disp: 60 capsule, Rfl: 0   L-Citrulline POWD, by Does not apply route daily., Disp: , Rfl:    MAGNESIUM PO, Take by mouth., Disp: , Rfl:    Misc Natural Products (MAGIC MUSHROOM MIX) CAPS, Take 2 capsules by mouth daily., Disp: , Rfl:    MORINGA OLEIFERA PO, Take 1,200 mg by mouth daily at 12 noon., Disp: , Rfl:    saw palmetto 80 MG capsule, Take 80 mg by mouth daily., Disp: , Rfl:    testosterone (ANDROGEL) 50 MG/5GM (1%) GEL, PLACE 5 GRAMS ONTO THE SKIN EVERY DAY, Disp: 150 g, Rfl: 1   zinc gluconate 50 MG tablet, Take 50 mg by mouth daily., Disp: , Rfl:  albuterol (VENTOLIN HFA) 108 (90 Base) MCG/ACT inhaler, Inhale 2 puffs into the lungs every 6 (six) hours as needed for wheezing or shortness of breath. (Patient not taking: Reported on 06/04/2023), Disp: 18 g, Rfl: 0   ezetimibe (ZETIA) 10 MG tablet, Take 1 tablet (10 mg total) by mouth daily., Disp: 90 tablet, Rfl: 3   hydrOXYzine (ATARAX) 10 MG tablet, TAKE 1 TABLET(10 MG) BY MOUTH THREE TIMES DAILY AS NEEDED, Disp: 90 tablet, Rfl: 1   methylPREDNISolone (MEDROL DOSEPAK) 4 MG TBPK tablet, Day 1: Take 8 mg (2 tablets) before breakfast, 4 mg (1 tablet) after lunch, 4 mg (1 tablet) after supper, and 8 mg (2 tablets) at bedtime. Day 2:Take 4 mg (1 tablet) before breakfast, 4 mg (1 tablet) after lunch, 4 mg (1 tablet) after supper, and 8 mg (2 tablets) at bedtime. Day 3: Take 4 mg (1 tablet) before breakfast, 4 mg (1 tablet) after lunch, 4 mg (1 tablet) after supper, and 4 mg (1 tablet) at bedtime. Day 4: Take 4 mg (1 tablet) before breakfast, 4 mg (1 tablet) after lunch, and 4 mg (1 tablet) at bedtime. Day 5: Take 4 mg (1 tablet) before breakfast and 4 mg (1 tablet) at bedtime. Day 6: Take 4 mg (1 tablet) before breakfast. (Patient not taking: Reported on 09/09/2022), Disp: 1 each, Rfl: 0   naproxen (NAPROSYN) 500 MG tablet, Take 500 mg by mouth 2 (two) times daily as needed., Disp: , Rfl:    pregabalin  (LYRICA) 100 MG capsule, Take 1 capsule (100 mg total) by mouth 2 (two) times daily. (Patient not taking: Reported on 06/04/2023), Disp: 180 capsule, Rfl: 1   rizatriptan (MAXALT) 10 MG tablet, Take 1 tablet (10 mg total) by mouth as needed for migraine. May repeat in 2 hours if needed (Patient not taking: Reported on 06/04/2023), Disp: 10 tablet, Rfl: 1   tiZANidine (ZANAFLEX) 4 MG tablet, Take 1 tablet (4 mg total) by mouth at bedtime. (Patient not taking: Reported on 06/04/2023), Disp: 90 tablet, Rfl: 1   TWYNEO 0.1-3 % CREA, Apply 1 application topically daily. PRN (Patient not taking: Reported on 06/04/2023), Disp: , Rfl:   Allergies  Allergen Reactions   Statins Other (See Comments)    Fever, chills, body aches   Gluten Meal Diarrhea and Nausea Only    I personally reviewed active problem list, medication list, allergies with the patient/caregiver today.   ROS  Ten systems reviewed and is negative except as mentioned in HPI    Objective Physical Exam Constitutional: Patient appears well-developed and well-nourished. Obese  No distress.  HEENT: head atraumatic, normocephalic, pupils equal and reactive to light, neck supple Cardiovascular: Normal rate, regular rhythm and normal heart sounds.  No murmur heard. No BLE edema. Pulmonary/Chest: Effort normal and breath sounds normal. No respiratory distress. Abdominal: Soft.  There is no tenderness. Psychiatric: Patient has a normal mood and affect. behavior is normal. Judgment and thought content normal.   Vitals:   06/04/23 0829  BP: 122/82  Pulse: 96  Resp: 16  SpO2: 100%  Weight: 203 lb 12.8 oz (92.4 kg)  Height: 6' (1.829 m)    Body mass index is 27.64 kg/m.    Diabetic Foot Exam:     PHQ2/9:    06/04/2023    8:28 AM 08/13/2022   11:26 AM 08/08/2022    2:17 PM 07/07/2022    2:00 PM 05/13/2022   11:17 AM  Depression screen PHQ 2/9  Decreased Interest 0 0 0  1 2  Down, Depressed, Hopeless 0 0 0 1 1  PHQ - 2 Score 0 0 0 2 3   Altered sleeping 0  0 1 1  Tired, decreased energy 0  0 3 1  Change in appetite 0  0 0 1  Feeling bad or failure about yourself  0  0 0 0  Trouble concentrating 0  0 0 0  Moving slowly or fidgety/restless 0  0 0 0  Suicidal thoughts 0  0 0 0  PHQ-9 Score 0  0 6 6  Difficult doing work/chores Not difficult at all  Not difficult at all  Not difficult at all    phq 9 is negative  Fall Risk:    08/13/2022   11:26 AM 08/08/2022    2:17 PM 07/07/2022    1:59 PM 05/13/2022   11:17 AM 04/08/2022    9:29 AM  Fall Risk   Falls in the past year? 0 0 0 0 0  Number falls in past yr: 0 0 0  0  Injury with Fall? 0 0 0  0  Risk for fall due to : No Fall Risks  No Fall Risks No Fall Risks No Fall Risks  Follow up Falls prevention discussed  Falls prevention discussed Falls prevention discussed Falls prevention discussed;Education provided;Falls evaluation completed     Assessment & Plan  1. Centrilobular emphysema (HCC) (Primary)  He is feeling well, no using inhalers, he had a recent lung cancer screen  2. Atherosclerosis of aorta (HCC)  Off Zetia but willing to resume if needed   3. History of drug dependence/abuse (HCC)  Doing well, many years   4. Major depression in remission (HCC)  - DULoxetine (CYMBALTA) 30 MG capsule; Take 1 capsule (30 mg total) by mouth daily.  Dispense: 30 capsule; Refill: 0  5. Low testosterone in male  - PSA - Comprehensive metabolic panel with GFR - Testosterone - Ambulatory referral to Urology - testosterone (ANDROGEL) 50 MG/5GM (1%) GEL; Place 5 g onto the skin daily.  Dispense: 150 g; Refill: 1  6. Migraine without aura and with status migrainosus, not intractable  Doing well no recent episodes  7. Elevated hematocrit  - CBC with Differential/Platelet  8. Dyslipidemia  - Lipid panel  9. Lower urinary tract symptoms (LUTS)  - PSA - Ambulatory referral to Urology   10. Snoring  - Ambulatory referral to Pulmonology

## 2023-06-05 ENCOUNTER — Encounter: Payer: Self-pay | Admitting: Family Medicine

## 2023-06-05 LAB — COMPREHENSIVE METABOLIC PANEL WITH GFR
AG Ratio: 1.7 (calc) (ref 1.0–2.5)
ALT: 32 U/L (ref 9–46)
AST: 26 U/L (ref 10–35)
Albumin: 4.7 g/dL (ref 3.6–5.1)
Alkaline phosphatase (APISO): 93 U/L (ref 35–144)
BUN: 9 mg/dL (ref 7–25)
CO2: 29 mmol/L (ref 20–32)
Calcium: 9.9 mg/dL (ref 8.6–10.3)
Chloride: 102 mmol/L (ref 98–110)
Creat: 0.9 mg/dL (ref 0.70–1.35)
Globulin: 2.8 g/dL (ref 1.9–3.7)
Glucose, Bld: 82 mg/dL (ref 65–99)
Potassium: 4.9 mmol/L (ref 3.5–5.3)
Sodium: 137 mmol/L (ref 135–146)
Total Bilirubin: 1.6 mg/dL — ABNORMAL HIGH (ref 0.2–1.2)
Total Protein: 7.5 g/dL (ref 6.1–8.1)
eGFR: 97 mL/min/{1.73_m2} (ref >=60)

## 2023-06-05 LAB — CBC WITH DIFFERENTIAL/PLATELET
Absolute Lymphocytes: 1760 {cells}/uL (ref 850–3900)
Absolute Monocytes: 634 {cells}/uL (ref 200–950)
Basophils Absolute: 51 {cells}/uL (ref 0–200)
Basophils Relative: 0.8 %
Eosinophils Absolute: 198 {cells}/uL (ref 15–500)
Eosinophils Relative: 3.1 %
HCT: 52.6 % — ABNORMAL HIGH (ref 38.5–50.0)
Hemoglobin: 17.6 g/dL — ABNORMAL HIGH (ref 13.2–17.1)
MCH: 32.8 pg (ref 27.0–33.0)
MCHC: 33.5 g/dL (ref 32.0–36.0)
MCV: 98 fL (ref 80.0–100.0)
MPV: 10.3 fL (ref 7.5–12.5)
Monocytes Relative: 9.9 %
Neutro Abs: 3757 {cells}/uL (ref 1500–7800)
Neutrophils Relative %: 58.7 %
Platelets: 328 10*3/uL (ref 140–400)
RBC: 5.37 10*6/uL (ref 4.20–5.80)
RDW: 12.5 % (ref 11.0–15.0)
Total Lymphocyte: 27.5 %
WBC: 6.4 10*3/uL (ref 3.8–10.8)

## 2023-06-05 LAB — TESTOSTERONE: Testosterone: 522 ng/dL (ref 250–827)

## 2023-06-05 LAB — LIPID PANEL
Cholesterol: 232 mg/dL — ABNORMAL HIGH (ref <200)
HDL: 43 mg/dL (ref >=40)
LDL Cholesterol (Calc): 168 mg/dL — ABNORMAL HIGH
Non-HDL Cholesterol (Calc): 189 mg/dL — ABNORMAL HIGH (ref <130)
Total CHOL/HDL Ratio: 5.4 (calc) — ABNORMAL HIGH (ref <5.0)
Triglycerides: 99 mg/dL (ref <150)

## 2023-06-05 LAB — PSA: PSA: 1.57 ng/mL (ref <=4.00)

## 2023-06-17 ENCOUNTER — Ambulatory Visit: Admitting: Urology

## 2023-06-17 ENCOUNTER — Encounter: Payer: Self-pay | Admitting: Urology

## 2023-06-17 VITALS — BP 147/83 | HR 68 | Ht 72.0 in | Wt 203.0 lb

## 2023-06-17 DIAGNOSIS — E291 Testicular hypofunction: Secondary | ICD-10-CM | POA: Diagnosis not present

## 2023-06-17 MED ORDER — TESTOSTERONE CYPIONATE 200 MG/ML IM SOLN: 200.0000 mg | INTRAMUSCULAR | 0 refills | Status: DC

## 2023-06-17 NOTE — Progress Notes (Signed)
 I, Ronald Ellis, acting as a scribe for Ronald Altes, MD., have documented all relevant documentation on the behalf of Ronald Altes, MD, as directed by Ronald Altes, MD while in the presence of Ronald Altes, MD.  4/16//2025 4:00 PM   Stephani Police 02-27-1961 [id number]  Referring provider: Alba Cory, MD [address] [place],  [place] [address]  Chief Complaint  Patient presents with   Hypogonadism    HPI: Ronald Ellis is a 63 y.o. male with a history of hypogonadism, referred to start testosterone injections.   Has been on topical testosterone prescribed by Dr. Carlynn Purl for symptoms of significant tiredness, fatigue, decreased libido, muscle loss, increased blood pressure, and central fat deposition Started testosterone gel May 2024 and used for 3 months; restarted March 2025 Levels were within normal range on topical testosterone, though he was under stress. Did not like the gel consistency and felt it was sticky, and requested to start testosterone injections.  Mild lower urinary tract symptoms which are not bothersome.  Most recent labs 06/04/23 testosterone 522, H/H 17.6/52.6, PSA 1.57 Symptoms did improve on topical testosterone   PMH: Past Medical History:  Diagnosis Date   Arthritis    Chronic pain    COPD (chronic obstructive pulmonary disease) (HCC)    Depression 08/06/1981   Gluten intolerance    Oxygen deficiency    Pneumonia     Surgical History: Past Surgical History:  Procedure Laterality Date   BACK SURGERY  2015   BACK SURGERY     COLONOSCOPY WITH PROPOFOL N/A 09/15/2022   Procedure: COLONOSCOPY WITH PROPOFOL;  Surgeon: [name], MD;  Location: [facility];  Service: Endoscopy;  Laterality: N/A;   INGUINAL HERNIA REPAIR Bilateral    multiple times   lumbar discetomy  2005   MOLE REMOVAL     NECK SURGERY  2016   SPINE SURGERY  12/15/01    Home Medications:  Allergies as of 06/17/2023       Reactions    Statins Other (See Comments)   Fever, chills, body aches   Gluten Meal Diarrhea, Nausea Only        Medication List        Accurate as of June 17, 2023  4:00 PM. If you have any questions, ask your nurse or doctor.          STOP taking these medications    albuterol 108 (90 Base) MCG/ACT inhaler Commonly known as: VENTOLIN HFA Stopped by: Ronald Ellis   rizatriptan 10 MG tablet Commonly known as: Maxalt Stopped by: Ronald Ellis   testosterone 50 MG/5GM (1%) Gel Commonly known as: ANDROGEL Stopped by: Ronald Ellis       TAKE these medications    celecoxib 100 MG capsule Commonly known as: CELEBREX Take 100 mg by mouth 2 (two) times daily.   DULoxetine 30 MG capsule Commonly known as: Cymbalta Take 1 capsule (30 mg total) by mouth daily.   L-Citrulline Powd by Does not apply route daily.   lidocaine 5 % Commonly known as: LIDODERM 1 patch daily.   Magic Mushroom Mix Caps Take 2 capsules by mouth daily.   MAGNESIUM PO Take by mouth.   MORINGA OLEIFERA PO Take 1,200 mg by mouth daily at 12 noon.   saw palmetto 80 MG capsule Take 80 mg by mouth daily.   testosterone cypionate 200 MG/ML injection Commonly known as: DEPOTESTOSTERONE CYPIONATE Inject 1 mL (200 mg total)  into the muscle every 14 (fourteen) days. Started by: Geraline Knapp   tiZANidine 2 MG tablet Commonly known as: ZANAFLEX Take 2 mg by mouth every 8 (eight) hours.   VITAMIN C PO Take by mouth.   zinc gluconate 50 MG tablet Take 50 mg by mouth daily.        Allergies:  Allergies  Allergen Reactions   Statins Other (See Comments)    Fever, chills, body aches   Gluten Meal Diarrhea and Nausea Only    Family History: Family History  Problem Relation Age of Onset   Hypertension Father    Lung disease Father    Hypertension Brother    Lung cancer Maternal Grandfather     Social History:  reports that he quit smoking about 11 years ago. His smoking use  included cigarettes. He started smoking about 47 years ago. He has a 42 pack-year smoking history. He has never used smokeless tobacco. He reports that he does not currently use alcohol. He reports that he does not currently use drugs after having used the following drugs: "Crack" cocaine, Cocaine, Heroin, Marijuana, and Other-see comments.   Physical Exam: BP (!) 147/83   Pulse 68   Ht 6' (1.829 m)   Wt 203 lb (92.1 kg)   BMI 27.53 kg/m   Constitutional:  Alert and oriented, No acute distress. HEENT: [place] AT. Respiratory: Normal respiratory effort, no increased work of breathing. GU: Phallus without lesions. Testes descended bilaterally and of soft consistency, estimated testicular volume approximately 10 cc bilaterally. Psychiatric: Normal mood and affect.   Assessment & Plan:    1. Hypogonadism Incomplete evaluation as LH has not been performed. Unlikely he has hypogonadotropic hypogonadism due to testicular exam. Since he has been on TRT, his LH level would not be valid.  Rx Testosterone Cypionate sent to pharmacy.  PA appointment scheduled for injection training. Recent H/H was slightly elevated, and recommend donating blood. Follow-up testosterone level 6 weeks after starting injections, and will also recheck H/H at that time.  I have reviewed the above documentation for accuracy and completeness, and I agree with the above.   Geraline Knapp, MD  [facility] [address] [place], [place] [address] [phone]

## 2023-06-22 ENCOUNTER — Telehealth: Payer: Self-pay | Admitting: Acute Care

## 2023-06-22 NOTE — Telephone Encounter (Signed)
 Call report LDCT:   IMPRESSION: 1. Lung-RADS 4B, suspicious. Additional imaging evaluation or consultation with Pulmonology or Thoracic Surgery recommended. Development of a mixed attenuation right upper lobe pulmonary nodule as detailed above. If the patient has infectious symptoms, antibiotic therapy and diagnostic CT follow-up at 6-8 weeks could be performed. If not, consider tissue sampling or PET to direct sampling. 2. Interstitial lung disease, minimally progressive. Differential considerations remain usual interstitial pneumonitis or fibrotic nonspecific interstitial pneumonitis. 3. Developing thoracic adenopathy which could be metastatic or reactive in the setting of interstitial lung disease. 4. Aortic atherosclerosis (ICD10-I70.0) and emphysema (ICD10-J43.9).   These results will be called to the ordering clinician or representative by the Radiologist Assistant, and communication documented in the PACS or [employer].

## 2023-06-23 ENCOUNTER — Ambulatory Visit: Admitting: Physician Assistant

## 2023-06-25 ENCOUNTER — Telehealth: Payer: Self-pay | Admitting: Acute Care

## 2023-06-25 ENCOUNTER — Other Ambulatory Visit: Payer: Self-pay | Admitting: Acute Care

## 2023-06-25 DIAGNOSIS — R911 Solitary pulmonary nodule: Secondary | ICD-10-CM

## 2023-06-25 NOTE — Telephone Encounter (Signed)
 I have called the patient with the results of his low-dose screening CT. The scan was completed May 14, 2023 and read June 19, 2023. The scan was read as a lung RADS 4B, there is a new 24.4 part solid right upper lobe/apex nodule with an 18 mm solid component.  There is also notation of a subcarinal lymph node previous measured as 9 mm currently measuring 11 mm. Additionally there is notation that there has been slight progression of ILD. I spoke with the patient at length and he states that he was not sick in March when he had the scan done.  Therefore plan will be for PET scan to better evaluate this area.  Patient will then follow-up with [facility] in [place] to review the PET results and determine next best steps in plan of care.  Patient has an appointment with Dr. Auston Left at the [facility] pulmonary office July 01, 2023 to would be evaluated for sleep apnea. Dr. Auston Left, if the PET scan is done before you see him on the 30th if you would review the results with him and determine next best steps. If this scan has not been completed at the point in time you meet with him on April 30 we will make sure he gets follow-up in your office to review the PET scan results once they are done.  Craige Dixon if PET scan is not done before patient sees Dr. Auston Left on April 30 he will need an appointment to review the PET scan results within 1 to 2 weeks  of scan being completed.   Lex Redbird, and [place] please fax results to PCP and let them know plan for follow-up is PET scan then office visit with pulmonary to determine next best steps.  Thank you everyone

## 2023-06-25 NOTE — Telephone Encounter (Signed)
 PET scan has been scheduled for 07/06/2023. Plan and results sent to PCP.

## 2023-06-25 NOTE — Telephone Encounter (Signed)
 I spoke the patient. He said the PET scan has been moved to 4/28. I left his appt at 4/30 with Dr. Auston Left to go over the PET results and his OSA.

## 2023-06-29 ENCOUNTER — Ambulatory Visit
Admission: RE | Admit: 2023-06-29 | Discharge: 2023-06-29 | Disposition: A | Discharge status: Home / Self Care | Source: Ambulatory Visit | Attending: Acute Care | Admitting: Acute Care

## 2023-06-29 DIAGNOSIS — J432 Centrilobular emphysema: Secondary | ICD-10-CM | POA: Insufficient documentation

## 2023-06-29 DIAGNOSIS — R590 Localized enlarged lymph nodes: Secondary | ICD-10-CM | POA: Insufficient documentation

## 2023-06-29 DIAGNOSIS — R911 Solitary pulmonary nodule: Secondary | ICD-10-CM | POA: Diagnosis present

## 2023-06-29 DIAGNOSIS — R918 Other nonspecific abnormal finding of lung field: Secondary | ICD-10-CM | POA: Diagnosis present

## 2023-06-29 LAB — GLUCOSE, CAPILLARY: Glucose-Capillary: 90 mg/dL (ref 70–99)

## 2023-06-29 MED ORDER — FLUDEOXYGLUCOSE F - 18 (FDG) INJECTION
10.5000 | Freq: Once | INTRAVENOUS | Status: AC | PRN
  Administered 2023-06-29: 11.15 via INTRAVENOUS

## 2023-06-30 ENCOUNTER — Ambulatory Visit: Admitting: Family Medicine

## 2023-07-01 ENCOUNTER — Ambulatory Visit: Admitting: Internal Medicine

## 2023-07-01 ENCOUNTER — Encounter: Payer: Self-pay | Admitting: Internal Medicine

## 2023-07-01 VITALS — BP 118/78 | HR 73 | Temp 98.7°F | Ht 72.0 in | Wt 205.8 lb

## 2023-07-01 DIAGNOSIS — G4710 Hypersomnia, unspecified: Secondary | ICD-10-CM | POA: Diagnosis not present

## 2023-07-01 DIAGNOSIS — G4733 Obstructive sleep apnea (adult) (pediatric): Secondary | ICD-10-CM

## 2023-07-01 DIAGNOSIS — R9389 Abnormal findings on diagnostic imaging of other specified body structures: Secondary | ICD-10-CM | POA: Diagnosis not present

## 2023-07-01 DIAGNOSIS — J849 Interstitial pulmonary disease, unspecified: Secondary | ICD-10-CM | POA: Diagnosis not present

## 2023-07-01 DIAGNOSIS — J449 Chronic obstructive pulmonary disease, unspecified: Secondary | ICD-10-CM

## 2023-07-01 DIAGNOSIS — Z87891 Personal history of nicotine dependence: Secondary | ICD-10-CM

## 2023-07-01 NOTE — Progress Notes (Signed)
 Name: Ronald Ellis MRN: [id number] DOB: [birth year]     CONSULTATION DATE: 07/01/2023  REFERRING MD : Ava Lei   STUDIES:   12/21/2018 CT chest Independently reviewed by Me Bilateral upper lobe predominant emphysematous changes along with mild interstitial lung disease throughout the lungs Images reviewed with the patient    CHIEF COMPLAINT:  Follow up COPD Follow up Lung cancer protocol   HISTORY OF PRESENT ILLNESS: Assessment of COPD and ILD Progressive shortness of breath and dyspnea on exertion No exacerbation at this time No evidence of heart failure at this time No evidence or signs of infection at this time No respiratory distress No fevers, chills, nausea, vomiting, diarrhea No evidence of lower extremity edema No evidence hemoptysis Noncompliant with Symbicort  I requested patient to restart his Symbicort  Rinse mouth after use Recommend obtaining pulmonary function test  Patient  has been having sleep problems for many years Patient has been having excessive daytime sleepiness for a long time Patient has been having extreme fatigue and tiredness, lack of energy +  very Loud snoring every night + struggling breathe at night and gasps for air   Discussed sleep data and reviewed with patient.  Encouraged proper weight management.  Discussed driving precautions and its relationship with hypersomnolence.  Discussed operating dangerous equipment and its relationship with hypersomnolence.  Discussed sleep hygiene, and benefits of a fixed sleep waked time.  The importance of getting eight or more hours of sleep discussed with patient.  Discussed limiting the use of the computer and television before bedtime.  Decrease naps during the day, so night time sleep will become enhanced.  Limit caffeine, and sleep deprivation.  HTN, stroke, and heart failure are potential risk factors.   Discussed risk of untreated sleep apnea including cardiac arrhthymias, stroke, DM,  pulm HTN.      07/01/2023    2:00 PM  Results of the Epworth flowsheet  Sitting and reading 3  Watching TV 1  Sitting, inactive in a public place (e.g. a theatre or a meeting) 0  As a passenger in a car for an hour without a break 0  Lying down to rest in the afternoon when circumstances permit 3  Sitting and talking to someone 0  Sitting quietly after a lunch without alcohol 0  In a car, while stopped for a few minutes in traffic 0  Total score 7    Abnormal CT chest March 2025 CT chest reviewed in detail with patient His right upper lobe nodular opacification groundglass opacification Follow-up PET scan shows resolution opacification Likely finding was inflammatory infectious process    Enrolled in lung cancer screening program Last Ct Chest 04/2021      March 2025 CT chest reviewed in detail with patient Right upper lobe opacification Follow-up PET scan shows resolution of right upper lobe nodule opacification     PAST MEDICAL HISTORY :   has a past medical history of Arthritis, Chronic pain, COPD (chronic obstructive pulmonary disease) (HCC), Depression (08/06/1981), Gluten intolerance, Oxygen  deficiency, and Pneumonia.  has a past surgical history that includes Back surgery (2015); Neck surgery (2016); Inguinal hernia repair (Bilateral); lumbar discetomy (2005); Back surgery; Mole removal; Spine surgery (12/15/01); and Colonoscopy with propofol  (N/A, 09/15/2022). Prior to Admission medications   Medication Sig Start Date End Date Taking? Authorizing Provider  acetaminophen  (TYLENOL ) 500 MG tablet Take 500 mg by mouth every 6 (six) hours as needed.   Yes [provider]  albuterol  (PROVENTIL  HFA;VENTOLIN  HFA) 108 (90 Base) MCG/ACT inhaler  Inhale 2 puffs into the lungs every 6 (six) hours as needed for wheezing or shortness of breath. 06/29/17  Yes Sowles, Krichna, MD  ARIPiprazole  (ABILIFY ) 5 MG tablet Take 1 tablet (5 mg total) by mouth daily. 03/30/19  Yes  Sowles, Krichna, MD  Ascorbic Acid (VITAMIN C PO) Take by mouth.   Yes [provider]  b complex vitamins tablet Take 1 tablet by mouth daily.   Yes [provider]  benzonatate  (TESSALON ) 200 MG capsule Take 1 capsule (200 mg total) by mouth 3 (three) times daily as needed. 05/30/19  Yes Sowles, Krichna, MD  BIOTIN PO Take by mouth.   Yes [provider]  DULoxetine  (CYMBALTA ) 60 MG capsule Take 1 capsule (60 mg total) by mouth daily. 03/30/19  Yes Sowles, Krichna, MD  fluticasone  (FLONASE ) 50 MCG/ACT nasal spray Place 2 sprays into both nostrils daily. 03/30/19  Yes Sowles, Krichna, MD  hydrOXYzine  (ATARAX /VISTARIL ) 10 MG tablet Take 1 tablet (10 mg total) by mouth 3 (three) times daily as needed. 12/28/18  Yes Sowles, Krichna, MD  MAGNESIUM PO Take by mouth.   Yes [provider]  pregabalin  (LYRICA ) 100 MG capsule Take 1 capsule (100 mg total) by mouth 3 (three) times daily. 05/30/19  Yes Sowles, Krichna, MD  rizatriptan  (MAXALT ) 10 MG tablet Take 1 tablet (10 mg total) by mouth as needed for migraine. May repeat in 2 hours if needed 04/12/19  Yes Sowles, Krichna, MD  terbinafine  (LAMISIL ) 250 MG tablet Take 1 tablet (250 mg total) by mouth daily. 03/09/19  Yes Hyatt, Max T, DPM  tiotropium (SPIRIVA  HANDIHALER) 18 MCG inhalation capsule Place 1 capsule (18 mcg total) into inhaler and inhale daily. 03/30/19  Yes Sowles, Krichna, MD  tiZANidine  (ZANAFLEX ) 4 MG tablet Take 1 tablet (4 mg total) by mouth at bedtime. 06/29/17  Yes Sowles, Krichna, MD  traZODone  (DESYREL ) 100 MG tablet Take 1 tablet (100 mg total) by mouth at bedtime. Pt taking 2 tablets qhs 04/27/19  Yes Sowles, Krichna, MD  fluticasone  furoate-vilanterol (BREO ELLIPTA ) 100-25 MCG/INH AEPB Inhale 1 puff into the lungs daily. Patient not taking: Reported on 06/21/2019 03/30/19   Sowles, Krichna, MD   Allergies  Allergen Reactions   Statins Other (See Comments)    Fever, chills, body aches   Gluten Meal  Diarrhea and Nausea Only    FAMILY HISTORY:  family history includes Hypertension in his brother and father; Lung cancer in his maternal grandfather; Lung disease in his father. SOCIAL HISTORY:  reports that he quit smoking about 11 years ago. His smoking use included cigarettes. He started smoking about 47 years ago. He has a 42 pack-year smoking history. He has never used smokeless tobacco. He reports that he does not currently use alcohol. He reports that he does not currently use drugs after having used the following drugs: "Crack" cocaine, Cocaine, Heroin, Marijuana, and Other-see comments.  BP 118/78 (BP Location: Right Arm, Patient Position: Sitting, Cuff Size: Normal)   Pulse 73   Temp 98.7 F (37.1 C) (Oral)   Ht 6' (1.829 m)   Wt 205 lb 12.8 oz (93.4 kg)   SpO2 94%   BMI 27.91 kg/m       Review of Systems: Gen:  Denies  fever, sweats, chills weight loss  HEENT: Denies blurred vision, double vision, ear pain, eye pain, hearing loss, nose bleeds, sore throat Cardiac:  No dizziness, chest pain or heaviness, chest tightness,edema, No JVD Resp:   No cough, -sputum production, +shortness  of breath,-wheezing, -hemoptysis,  Other:  All other systems negative   Physical Examination:   General Appearance: No distress  EYES PERRLA, EOM intact.   NECK Supple, No JVD Pulmonary: normal breath sounds, No wheezing.  CardiovascularNormal S1,S2.  No m/r/g.   Abdomen: Benign, Soft, non-tender. Neurology UE/LE 5/5 strength, no focal deficits Ext pulses intact, cap refill intact ALL OTHER ROS ARE NEGATIVE       ASSESSMENT AND PLAN SYNOPSIS  63 year old pleasant white male seen today for follow-up assessment for COPD along with interstitial lung disease with CT scan findings consistent with bilateral cystic and send emphysematous changes along with honeycomb pattern and mild form of the peripheral edges of the lung with a recent change in CT scan with a follow-up PET scan that  shows resolution of nodular opacity in the right upper lobe . Patient also has signs symptoms of obstructive sleep apnea  Video evidence shows significant apnea   ILD/COPD assessment Recommend repeat PFT to assess lung function No exacerbation at this time No evidence of heart failure at this time No evidence or signs of infection at this time No respiratory distress No fevers, chills, nausea, vomiting, diarrhea No evidence of lower extremity edema No evidence hemoptysis Recommend restarting Symbicort  Albuterol  as needed Avoid Allergens and Irritants Avoid secondhand smoke Avoid SICK contacts Recommend  Masking  when appropriate Recommend Keep up-to-date with vaccinations    Abnormal CT chest Follow up Lung cancer screening program Recommend follow-up CT chest in 6 months to ensure no significant changes and then subsequent follow-up lung cancer screening program   Excessive daytime sleepiness and sleep apnea Recommend home sleep test for definitive diagnosis   MEDICATION ADJUSTMENTS/LABS AND TESTS ORDERED: Recommend repeat CT chest in 6 months Recommend restarting Symbicort  2 puffs in the morning 2 puffs at night Please rinse mouth after use Obtain breathing test pulmonary function tests Recommend home sleep study to assess for sleep apnea Follow-up lung cancer screening protocol Avoid Allergens and Irritants Avoid secondhand smoke Avoid SICK contacts Recommend  Masking  when appropriate Recommend Keep up-to-date with vaccinations   Patient satisfied with Plan of action and management. All questions answered  Follow-up in 6 months  TOTAL TIME SPENT WITH PATIENT 48 minutes  Nussen Pullin Nestora Baptise, M.D.  [facility]  Medical Director [facility] Medical Director [facility] Cardio-Pulmonary Department

## 2023-07-01 NOTE — Patient Instructions (Addendum)
 Recommend repeat CT chest in 6 months Recommend restarting Symbicort  2 puffs in the morning 2 puffs at night Please rinse mouth after use  Obtain breathing test pulmonary function tests  Recommend home sleep study to assess for sleep apnea  Follow-up lung cancer screening protocol  Avoid Allergens and Irritants Avoid secondhand smoke Avoid SICK contacts Recommend  Masking  when appropriate Recommend Keep up-to-date with vaccinations

## 2023-07-02 ENCOUNTER — Ambulatory Visit: Admitting: Physician Assistant

## 2023-07-02 ENCOUNTER — Encounter: Payer: Self-pay | Admitting: Physician Assistant

## 2023-07-02 VITALS — BP 129/78 | HR 80 | Ht 72.0 in | Wt 206.0 lb

## 2023-07-02 DIAGNOSIS — E291 Testicular hypofunction: Secondary | ICD-10-CM | POA: Diagnosis not present

## 2023-07-02 MED ORDER — TESTOSTERONE CYPIONATE 200 MG/ML IM SOLN
200.0000 mg | Freq: Once | INTRAMUSCULAR | Status: AC
Start: 2023-07-02 — End: 2023-07-02
  Administered 2023-07-02: 200 mg via INTRAMUSCULAR

## 2023-07-02 NOTE — Patient Instructions (Signed)
 Supplies needed: -Testosterone from your pharmacy -Alcohol swabs -18G Luer Lock needles to draw up the medicine -21G Luer Lock needles to inject the medicine -3cc Luer Lock syringes -Bandaids or gauze pads -Optional: Transport planner      Instructions for disposing of sharps:  Disposal of syringes and other sharp objects is monitored by the Dietitian (EPA). It is important to dispose of them properly for your safety and for the safety of others.  The EPA promotes all recycling activities, and therefore encourages you to discard medical waste sharps in sturdy, non-recyclable containers, when possible.  Your stat or community environmental programs may have other requirements or suggestions for disposing of your medical waste.  You should contact your local EPA office for any information you may need.  What container should be used Place needles, syringes, lancets and other sharp objects in a hard plastic or metal container with a screw on or tightly secured lid.  Many containers found in the household will do, or you may purchase containers specifically designed for disposal of medical wast sharps.  If a recyclable container is used to dispose of medical waste sharps, make sure that you don't mix the container with other materials to be recycled.  Since the sharps impair a containers recyclability, a container holding your medical waste sharps properly belongs with the regular household trash.  You should label the container "Not for Recycling".  In addition, make sure your sharps container is made of non breakable material and has a lid that can be securely closed (screwed on or tightly secured).  Before discarding a container, be sure to reinforce the lid with heavy-duty tape.  Do not put sharpe objects in a container you plan to recycle or return to a store, and do not use glass or clear plastic containers (see additional information below).  Finally, make sure that you  keep all containers with sharp objects out of the reach of children and pets.  Your home care provider may deliver a sharps container with your medical supplies.  If so place all needles, syringes and lancets in this container and notify the company when the container is approximately 75% full.  Your home care provider will arrange for pickup of the container.  For your safety, do NOT bring your container to the hospital for disposal.        Tips for minimizing injection pain- -inject medicine that is at room temperature -remove all air bubbles from the syringe before injection -wait until the topical alcohol has evaporated before injecting -keep muscles in the injection area relaxed -break through the skin quickly -don't change the direction of the needle as it goes in or comes out -do not reuse disposable needles

## 2023-07-02 NOTE — Progress Notes (Signed)
 Patient presents today for Testosterone  injection teaching. Patient was instructed on how to properly use the 18guage needle to draw up 1cc of the testosterone , into 3cc syringe then changed the needle to the 21guage for injection. Patient then cleaned the vastus lateralis with an alcohol swab and injected the site with bevel up.   Patient dose: 200mg  (1mL) Lot Number: [id number] Expiration date: 12/2025 Location: Right  Patient verbalized understanding current dose is 1ml every 14days unless instructed by a provider.  Patient tolerated well.  Patient understood how to dispose of sharps properly and store medication.   Performed by: Shaunice Levitan, PA-C   I spent 20 minutes on the day of the encounter to include pre-visit record review, face-to-face time with the patient, and post-visit ordering of tests.

## 2023-07-06 ENCOUNTER — Ambulatory Visit

## 2023-07-08 ENCOUNTER — Ambulatory Visit: Admitting: Internal Medicine

## 2023-07-08 DIAGNOSIS — J449 Chronic obstructive pulmonary disease, unspecified: Secondary | ICD-10-CM | POA: Diagnosis not present

## 2023-07-08 DIAGNOSIS — R9389 Abnormal findings on diagnostic imaging of other specified body structures: Secondary | ICD-10-CM

## 2023-07-08 DIAGNOSIS — J849 Interstitial pulmonary disease, unspecified: Secondary | ICD-10-CM

## 2023-07-08 LAB — PULMONARY FUNCTION TEST
DL/VA % pred: 80 %
DL/VA: 3.34 ml/min/mmHg/L
DLCO unc % pred: 58 %
DLCO unc: 16.94 ml/min/mmHg
FEF 25-75 Post: 3.26 L/s
FEF 25-75 Pre: 2.77 L/s
FEF2575-%Change-Post: 17 %
FEF2575-%Pred-Post: 104 %
FEF2575-%Pred-Pre: 88 %
FEV1-%Change-Post: 2 %
FEV1-%Pred-Post: 77 %
FEV1-%Pred-Pre: 74 %
FEV1-Post: 2.95 L
FEV1-Pre: 2.87 L
FEV1FVC-%Change-Post: 4 %
FEV1FVC-%Pred-Pre: 105 %
FEV6-%Change-Post: -1 %
FEV6-%Pred-Post: 73 %
FEV6-%Pred-Pre: 74 %
FEV6-Post: 3.55 L
FEV6-Pre: 3.6 L
FEV6FVC-%Change-Post: 0 %
FEV6FVC-%Pred-Post: 104 %
FEV6FVC-%Pred-Pre: 104 %
FVC-%Change-Post: -1 %
FVC-%Pred-Post: 69 %
FVC-%Pred-Pre: 71 %
FVC-Post: 3.55 L
FVC-Pre: 3.62 L
Post FEV1/FVC ratio: 83 %
Post FEV6/FVC ratio: 100 %
Pre FEV1/FVC ratio: 79 %
Pre FEV6/FVC Ratio: 100 %
RV % pred: 73 %
RV: 1.74 L
TLC % pred: 73 %
TLC: 5.41 L

## 2023-07-08 NOTE — Patient Instructions (Signed)
 Full PFT completed today ? ?

## 2023-07-08 NOTE — Progress Notes (Signed)
 Full PFT completed today ? ?

## 2023-07-11 ENCOUNTER — Other Ambulatory Visit: Payer: Self-pay | Admitting: Family Medicine

## 2023-07-11 DIAGNOSIS — F325 Major depressive disorder, single episode, in full remission: Secondary | ICD-10-CM

## 2023-07-14 ENCOUNTER — Encounter

## 2023-07-14 ENCOUNTER — Other Ambulatory Visit: Payer: Self-pay | Admitting: Family Medicine

## 2023-07-14 ENCOUNTER — Ambulatory Visit: Payer: Self-pay

## 2023-07-14 DIAGNOSIS — G4733 Obstructive sleep apnea (adult) (pediatric): Secondary | ICD-10-CM

## 2023-07-14 NOTE — Telephone Encounter (Signed)
 Chief Complaint: medication  Disposition: [] ED /[] Urgent Care (no appt availability in office) / [] Appointment(In office/virtual)/ []  [facility]/ [] Home Care/ [] Refused Recommended Disposition /[] [facility]/ [x]  Follow-up with PCP  Additional Notes: pt states that he is currently out of his Duloxetine  and is now having a swooshing sound in his head. States that he is in the process of weaning off the medication as he no longer feels he needs it and has run out of the medication for about 4 days. He is requesting a refill of the medication but only in the 15mg  dosage sent in to [facility].   Copied from CRM [id number]. Topic: Clinical - Red Word Triage >> Jul 14, 2023  9:35 AM [employer] wrote: Red Word that prompted transfer to Nurse Triage: Patient states his head is swimming. He is currently taking DULoxetine  (CYMBALTA ) 30 MG capsule. He stated he is not depressed and his depression is only seasonal. He has to ween off the medication and needs it sent in to preferred pharmacy with 15MG   Preferred Pharmacy:  [id number], [place] - [address] AT [facility] [address] [place] [address] Phone: [phone] Fax: [phone] Hours: Not open 24 hours Reason for Disposition  [1] Caller has URGENT medicine question about med that PCP or specialist prescribed AND [2] triager unable to answer question  Answer Assessment - Initial Assessment Questions 1. NAME of MEDICINE: "What medicine(s) are you calling about?"     Duloxetine  2. QUESTION: "What is your question?" (e.g., double dose of medicine, side effect)     Side effect 3. PRESCRIBER: "Who prescribed the medicine?" Reason: if prescribed by specialist, call should be referred to that group.     sowles 4. SYMPTOMS: "Do you have any symptoms?" If Yes, ask: "What symptoms are you having?"  "How bad are the symptoms (e.g., mild, moderate, severe)     Swimming in  head  Protocols used: Medication Question Call-A-AH

## 2023-07-15 ENCOUNTER — Encounter: Payer: Self-pay | Admitting: Podiatry

## 2023-07-15 ENCOUNTER — Ambulatory Visit: Admitting: Podiatry

## 2023-07-15 ENCOUNTER — Ambulatory Visit (INDEPENDENT_AMBULATORY_CARE_PROVIDER_SITE_OTHER)

## 2023-07-15 DIAGNOSIS — M722 Plantar fascial fibromatosis: Secondary | ICD-10-CM

## 2023-07-15 DIAGNOSIS — M7751 Other enthesopathy of right foot: Secondary | ICD-10-CM | POA: Diagnosis not present

## 2023-07-15 DIAGNOSIS — M7752 Other enthesopathy of left foot: Secondary | ICD-10-CM

## 2023-07-15 DIAGNOSIS — M778 Other enthesopathies, not elsewhere classified: Secondary | ICD-10-CM

## 2023-07-15 MED ORDER — METHYLPREDNISOLONE 4 MG PO TBPK: ORAL_TABLET | ORAL | 0 refills | Status: DC

## 2023-07-15 NOTE — Progress Notes (Signed)
 Subjective:  Patient ID: Ronald Ellis, male    DOB: 05/04/1960,  MRN: [id number] HPI Chief Complaint  Patient presents with   Foot Pain    "My heels are killing me." N - heel pain L - plantar heel bilateral D - 1 month O - suddenly, gotten worse C - sharp pain, sore A - sitting a while and get up, walking, standing T - Tylenol , stay off for a while    63 y.o. male presents with the above complaint.   ROS: Denies fever chills nausea vomit muscle aches pains calf pain back pain chest pain shortness of breath.  Past Medical History:  Diagnosis Date   Arthritis    Chronic pain    COPD (chronic obstructive pulmonary disease) (HCC)    Depression 08/06/1981   Gluten intolerance    Oxygen  deficiency    Pneumonia    Past Surgical History:  Procedure Laterality Date   BACK SURGERY  2015   BACK SURGERY     COLONOSCOPY WITH PROPOFOL  N/A 09/15/2022   Procedure: COLONOSCOPY WITH PROPOFOL ;  Surgeon: Marnee Sink, MD;  Location: [facility];  Service: Endoscopy;  Laterality: N/A;   INGUINAL HERNIA REPAIR Bilateral    multiple times   lumbar discetomy  2005   MOLE REMOVAL     NECK SURGERY  2016   SPINE SURGERY  12/15/01    Current Outpatient Medications:    methylPREDNISolone  (MEDROL  DOSEPAK) 4 MG TBPK tablet, 6 day dose pack - take as directed, Disp: 21 tablet, Rfl: 0   Ascorbic Acid (VITAMIN C PO), Take by mouth., Disp: , Rfl:    celecoxib (CELEBREX) 100 MG capsule, Take 100 mg by mouth 2 (two) times daily., Disp: , Rfl:    DULoxetine  (CYMBALTA ) 30 MG capsule, TAKE 1 CAPSULE(30 MG) BY MOUTH DAILY, Disp: 90 capsule, Rfl: 1   L-Citrulline POWD, by Does not apply route daily., Disp: , Rfl:    lidocaine (LIDODERM) 5 %, 1 patch daily., Disp: , Rfl:    MAGNESIUM PO, Take by mouth., Disp: , Rfl:    Misc Natural Products (MAGIC MUSHROOM MIX) CAPS, Take 2 capsules by mouth daily., Disp: , Rfl:    MORINGA OLEIFERA PO, Take 1,200 mg by mouth daily at 12 noon., Disp: , Rfl:    saw  palmetto 80 MG capsule, Take 80 mg by mouth daily., Disp: , Rfl:    testosterone  cypionate (DEPOTESTOSTERONE CYPIONATE) 200 MG/ML injection, Inject 1 mL (200 mg total) into the muscle every 14 (fourteen) days., Disp: 4 mL, Rfl: 0   tiZANidine  (ZANAFLEX ) 2 MG tablet, Take 2 mg by mouth every 8 (eight) hours., Disp: , Rfl:    zinc gluconate 50 MG tablet, Take 50 mg by mouth daily., Disp: , Rfl:   Allergies  Allergen Reactions   Statins Other (See Comments)    Fever, chills, body aches   Gluten Meal Diarrhea and Nausea Only   Review of Systems Objective:  There were no vitals filed for this visit.  General: Well developed, nourished, in no acute distress, alert and oriented x3   Dermatological: Skin is warm, dry and supple bilateral. Nails x 10 are well maintained; remaining integument appears unremarkable at this time. There are no open sores, no preulcerative lesions, no rash or signs of infection present.  Vascular: Dorsalis Pedis artery and Posterior Tibial artery pedal pulses are 2/4 bilateral with immedate capillary fill time. Pedal hair growth present. No varicosities and no lower extremity edema present bilateral.   Neruologic:  Grossly intact via light touch bilateral. Vibratory intact via tuning fork bilateral. Protective threshold with Semmes Wienstein monofilament intact to all pedal sites bilateral. Patellar and Achilles deep tendon reflexes 2+ bilateral. No Babinski or clonus noted bilateral.   Musculoskeletal: No gross boney pedal deformities bilateral. No pain, crepitus, or limitation noted with foot and ankle range of motion bilateral. Muscular strength 5/5 in all groups tested bilateral.  Pain on palpation medial calcaneal tubercle bilateral  Gait: Unassisted, Nonantalgic.    Radiographs:  Radiographs taken demonstrate osseously mature foot bilaterally plantar distally on calcaneal spur soft tissue increase in density plantar fashion calcaneal insertion site no acute  findings.  Assessment & Plan:   Assessment: Plantar fasciitis bilateral  Plan: Plantar fasciitis bilateral was injected today 20 mg Kenalog  5 mg Marcaine point maximal tenderness.  Was prescribed methylprednisolone  will continue the Celebrex we will follow-up with him should this not resolve.     Ronald Ellis T. [place], [place]

## 2023-07-15 NOTE — Patient Instructions (Signed)
 Exercises for Plantar Fasciitis Foot and leg exercises can help if you have plantar fasciitis. Only do the exercises you were told to do. Make sure you know how to do the exercises safely. Follow the steps below. It's normal to feel mild discomfort. Stop if you feel pain or your pain gets worse. Do not start these exercises until told by your health care provider. Stretching and range-of-motion exercises These exercises warm up your muscles and joints. They also help with movement and flexibility of your foot. They can help with pain. Plantar fascia stretch This exercise will stretch your plantar fascia, which is a band of thick tissue on the bottom of your foot. Sit with your left / right leg crossed over your other knee. Hold your heel with one hand with that thumb near your arch. With your other hand, hold your toes. Gently pull your toes back toward the top of your foot. You should feel a stretch on the bottom of your toes, on the bottom of your foot, or both. Hold this stretch for __________ seconds. Slowly let go of your toes. Go back to the starting position. Repeat __________ times. Do this exercise __________ times a day. Gastroc stretch, standing This exercise is called an upper calf, or gastroc, stretch. It stretches the muscles in the back of your upper calf. Stand with your hands against a wall. Extend your left / right leg behind you. Bend your front knee just a little. Keep your heels on the floor, your toes facing forward, and your back knee straight. Shift your weight toward the wall. Do not arch your back. You should feel a gentle stretch in your upper calf. Hold this position for __________ seconds. Repeat __________ times. Do this exercise __________ times a day. Soleus stretch, standing This exercise is called a lower calf, or soleus, stretch. It stretches the muscles in the back of your lower calf. Stand with your hands against a wall. Extend your left / right leg behind  you, and bend your front knee slightly. Keep your heels on the floor and your toes facing forward. Bend your back knee and shift your weight slightly over your back leg. You should feel a gentle stretch deep in your lower calf. Hold this position for __________ seconds. Repeat __________ times. Do this exercise __________ times a day. Gastroc and soleus stretch, standing step This exercise stretches the muscles in the back of your lower leg. This includes your gastroc and soleus muscles. Stand with the ball of your left / right foot on the front of a step. The ball of your foot is on the walking surface, right under your toes. Keep your other foot firmly on the same step. Hold on to the wall or a railing for balance. Slowly lift your other foot, letting your body weight press your heel down over the edge of the front of the step. Keep your knee straight and unbent. You should feel a stretch in your calf. Hold this position for __________ seconds. Return both feet to the step. Repeat this exercise with a slight bend in your left / right knee. Repeat __________ times with your left / right knee straight and __________ times with your left / right knee bent. Do this exercise __________ times a day. Balance exercise This exercise builds your balance and strength control of your arch. It helps take pressure off your plantar fascia. Single leg stand If this exercise is too easy, you can try it with your eyes closed  or while standing on a pillow. Without shoes, stand near a railing or in a doorway. You may hold on to the railing or doorway as needed. Stand on your left / right foot. Keep your big toe down on the floor. Lift the arch of your foot. You should feel a stretch across the bottom of your foot and arch. Do not let your foot roll inward. Hold this position for __________ seconds. Repeat __________ times. Do this exercise __________ times a day. This information is not intended to replace  advice given to you by your health care provider. Make sure you discuss any questions you have with your health care provider. Document Revised: 07/21/2022 Document Reviewed: 07/21/2022 Elsevier Patient Education  2024 [employer].

## 2023-07-16 ENCOUNTER — Ambulatory Visit: Admitting: Urology

## 2023-07-31 ENCOUNTER — Ambulatory Visit: Admitting: Internal Medicine

## 2023-08-03 DIAGNOSIS — G4733 Obstructive sleep apnea (adult) (pediatric): Secondary | ICD-10-CM | POA: Diagnosis not present

## 2023-08-05 ENCOUNTER — Other Ambulatory Visit: Payer: Self-pay | Admitting: Family Medicine

## 2023-08-05 DIAGNOSIS — F321 Major depressive disorder, single episode, moderate: Secondary | ICD-10-CM

## 2023-08-05 DIAGNOSIS — E785 Hyperlipidemia, unspecified: Secondary | ICD-10-CM

## 2023-08-07 ENCOUNTER — Ambulatory Visit: Payer: Self-pay

## 2023-08-10 ENCOUNTER — Other Ambulatory Visit: Payer: Self-pay

## 2023-08-12 ENCOUNTER — Other Ambulatory Visit: Payer: Self-pay

## 2023-08-12 DIAGNOSIS — G4733 Obstructive sleep apnea (adult) (pediatric): Secondary | ICD-10-CM

## 2023-08-12 NOTE — Telephone Encounter (Signed)
 Order has been placed.

## 2023-08-14 ENCOUNTER — Encounter: Payer: Self-pay | Admitting: Family Medicine

## 2023-08-14 ENCOUNTER — Telehealth: Payer: Self-pay | Admitting: Family Medicine

## 2023-08-14 ENCOUNTER — Ambulatory Visit (INDEPENDENT_AMBULATORY_CARE_PROVIDER_SITE_OTHER): Payer: Self-pay | Admitting: Family Medicine

## 2023-08-14 VITALS — BP 116/76 | HR 95 | Resp 16 | Ht 71.5 in | Wt 202.7 lb

## 2023-08-14 DIAGNOSIS — Z0000 Encounter for general adult medical examination without abnormal findings: Secondary | ICD-10-CM | POA: Diagnosis not present

## 2023-08-14 DIAGNOSIS — R399 Unspecified symptoms and signs involving the genitourinary system: Secondary | ICD-10-CM | POA: Diagnosis not present

## 2023-08-14 NOTE — Progress Notes (Addendum)
 Name: Ronald Ellis   MRN: [id number]    DOB: August 08, 1960   Date:08/14/2023       Progress Note  Subjective  Chief Complaint  Chief Complaint  Patient presents with   Annual Exam    HPI  Patient presents for annual CPE .   IPSS     Row Name 08/14/23 1046         International Prostate Symptom Score   How often have you had the sensation of not emptying your bladder? Almost always     How often have you had to urinate less than every two hours? Almost always     How often have you found you stopped and started again several times when you urinated? Almost always     How often have you found it difficult to postpone urination? Not at All     How often have you had a weak urinary stream? Almost always     How often have you had to strain to start urination? Not at All     How many times did you typically get up at night to urinate? 1 Time     Total IPSS Score 21       Quality of Life due to urinary symptoms   If you were to spend the rest of your life with your urinary condition just the way it is now how would you feel about that? Mostly Disatisfied       Advised to discuss it with urologist on June 19 th 2025  Diet: eating healthier, cutting down on ice cream Exercise: continue regular activity Last Dental Exam: due for a visit  Last Eye Exam: due for an appointment   Depression: phq 9 is negative    08/14/2023   10:46 AM 06/04/2023    8:28 AM 08/13/2022   11:26 AM 08/08/2022    2:17 PM 07/07/2022    2:00 PM  Depression screen PHQ 2/9  Decreased Interest 0 0 0 0 1  Down, Depressed, Hopeless 0 0 0 0 1  PHQ - 2 Score 0 0 0 0 2  Altered sleeping  0  0 1  Tired, decreased energy  0  0 3  Change in appetite  0  0 0  Feeling bad or failure about yourself   0  0 0  Trouble concentrating  0  0 0  Moving slowly or fidgety/restless  0  0 0  Suicidal thoughts  0  0 0  PHQ-9 Score  0  0 6  Difficult doing work/chores  Not difficult at all  Not difficult at all      Hypertension:  BP Readings from Last 3 Encounters:  08/14/23 116/76  07/02/23 129/78  07/01/23 118/78    Obesity: Wt Readings from Last 3 Encounters:  08/14/23 202 lb 11.2 oz (91.9 kg)  07/08/23 206 lb 12.8 oz (93.8 kg)  07/02/23 206 lb (93.4 kg)   BMI Readings from Last 3 Encounters:  08/14/23 27.88 kg/m  07/08/23 28.05 kg/m  07/02/23 27.94 kg/m     [employer] Visit from 08/14/2023 in [facility]  AUDIT-C Score 0     Significant Other STD testing and prevention (HIV/chl/gon/syphilis):  not applicable Sexual history: not sexually active due to significant other's problems. Hep C Screening: completed Skin cancer: Discussed monitoring for atypical lesions Colorectal cancer: up to date  Prostate cancer:  yes Lab Results  Component Value Date   PSA 1.57 06/04/2023  PSA 1.11 08/02/2021     Lung cancer:  up to date  AAA: The USPSTF recommends one-time screening with ultrasonography in men ages 74 to 75 years who have ever smoked. Patient   is not a candidate for screening  ECG:  2021  Vaccines: reviewed with the patient.   Advanced Care Planning: A voluntary discussion about advance care planning including the explanation and discussion of advance directives.  Discussed health care proxy and Living will, and the patient was able to identify a health care proxy as significant other .  Patient does not have a living will and power of attorney of health care   Patient Active Problem List   Diagnosis Date Noted   Positive colorectal cancer screening using Cologuard test 09/15/2022   Polyp of sigmoid colon 09/15/2022   Emphysema lung (HCC) 08/12/2021   Nocturnal hypoxia 06/25/2020   Migraine without aura and without status migrainosus, not intractable 05/02/2020   Seasonal affective disorder (HCC) 05/02/2020   Chest pain of uncertain etiology 06/24/2019   Atherosclerosis of aorta (HCC) 12/17/2018   Personal history of tobacco  use, presenting hazards to health 12/16/2018   History of drug dependence/abuse (HCC) 11/26/2018   Celiac disease 02/05/2018   Right-sided low back pain with right-sided sciatica 12/05/2015   Plantar fasciitis of right foot 10/15/2015   Chronic neck and back pain 07/19/2015    Past Surgical History:  Procedure Laterality Date   BACK SURGERY  2015   BACK SURGERY     COLONOSCOPY WITH PROPOFOL  N/A 09/15/2022   Procedure: COLONOSCOPY WITH PROPOFOL ;  Surgeon: Marnee Sink, MD;  Location: [facility];  Service: Endoscopy;  Laterality: N/A;   INGUINAL HERNIA REPAIR Bilateral    multiple times   lumbar discetomy  2005   MOLE REMOVAL     NECK SURGERY  2016   SPINE SURGERY  12/15/01    Family History  Problem Relation Age of Onset   Hypertension Father    Lung disease Father    Hypertension Brother    Lung cancer Maternal Grandfather     Social History   Socioeconomic History   Marital status: Significant Other    Spouse name: Chanda Combes   Number of children: 4   Years of education: Not on file   Highest education level: Associate degree: occupational, Scientist, product/process development, or vocational program  Occupational History   Occupation: disbaled     Comment: Loss adjuster, [employer]ered [employer]   Tobacco Use   Smoking status: Former    Current packs/day: 0.00    Average packs/day: 1 pack/day for 42.0 years (42.0 ttl pk-yrs)    Types: Cigarettes, Cigars    Start date: 11    Quit date: 12/13/2011    Years since quitting: 11.6   Smokeless tobacco: Never   Tobacco comments:    09/09/22 - may have occasional cigar  Vaping Use   Vaping status: Never Used  Substance and Sexual Activity   Alcohol use: Not Currently    Comment: quit 5-6 years ago, only beer - but glutten free diet now   Drug use: Not Currently    Types: Crack cocaine, Cocaine, Hashish, Heroin, Hydrocodone , LSD, Marijuana, Oxycodone , Other-see comments    Comment: meths in his 20's-30's (No illicit drugs in  25-30 yrs)   Sexual activity: Yes    Partners: Female    Birth control/protection: None  Other Topics Concern   Not on file  Social History Narrative   Approved for disability 06/2017 (  workman's comp back 2014) , secondary to neck and back injury    Social Drivers of Health   Financial Resource Strain: Medium Risk (08/13/2023)   Overall Financial Resource Strain (CARDIA)    Difficulty of Paying Living Expenses: Somewhat hard  Food Insecurity: Food Insecurity Present (08/13/2023)   Hunger Vital Sign    Worried About Running Out of Food in the Last Year: Sometimes true    Ran Out of Food in the Last Year: Sometimes true  Transportation Needs: No Transportation Needs (08/13/2023)   PRAPARE - Administrator, Civil Service (Medical): No    Lack of Transportation (Non-Medical): No  Physical Activity: Sufficiently Active (08/13/2023)   Exercise Vital Sign    Days of Exercise per Week: 3 days    Minutes of Exercise per Session: 60 min  Stress: No Stress Concern Present (08/13/2023)   [employer] of Occupational Health - Occupational Stress Questionnaire    Feeling of Stress: Not at all  Social Connections: Socially Isolated (08/13/2023)   Social Connection and Isolation Panel    Frequency of Communication with Friends and Family: Three times a week    Frequency of Social Gatherings with Friends and Family: Once a week    Attends Religious Services: Never    Database administrator or Organizations: No    Attends Engineer, structural: Not on file    Marital Status: Divorced  Intimate Partner Violence: Not At Risk (08/14/2023)   Humiliation, Afraid, Rape, and Kick questionnaire    Fear of Current or Ex-Partner: No    Emotionally Abused: No    Physically Abused: No    Sexually Abused: No     Current Outpatient Medications:    Ascorbic Acid (VITAMIN C PO), Take by mouth., Disp: , Rfl:    celecoxib (CELEBREX) 100 MG capsule, Take 100 mg by mouth 2 (two) times  daily., Disp: , Rfl:    DULoxetine  (CYMBALTA ) 30 MG capsule, TAKE 1 CAPSULE(30 MG) BY MOUTH DAILY, Disp: 90 capsule, Rfl: 1   L-Citrulline POWD, by Does not apply route daily., Disp: , Rfl:    lidocaine (LIDODERM) 5 %, 1 patch daily., Disp: , Rfl:    MAGNESIUM PO, Take by mouth., Disp: , Rfl:    Misc Natural Products (MAGIC MUSHROOM MIX) CAPS, Take 2 capsules by mouth daily., Disp: , Rfl:    MORINGA OLEIFERA PO, Take 1,200 mg by mouth daily at 12 noon., Disp: , Rfl:    saw palmetto 80 MG capsule, Take 80 mg by mouth daily., Disp: , Rfl:    testosterone  cypionate (DEPOTESTOSTERONE CYPIONATE) 200 MG/ML injection, Inject 1 mL (200 mg total) into the muscle every 14 (fourteen) days., Disp: 4 mL, Rfl: 0   tiZANidine  (ZANAFLEX ) 2 MG tablet, Take 2 mg by mouth every 8 (eight) hours., Disp: , Rfl:    zinc gluconate 50 MG tablet, Take 50 mg by mouth daily., Disp: , Rfl:   Allergies  Allergen Reactions   Statins Other (See Comments)    Fever, chills, body aches   Gluten Meal Diarrhea and Nausea Only     ROS  Constitutional: Negative for fever or weight change.  Respiratory: positive  for cough and shortness of breath that is stable.   Cardiovascular: Negative for chest pain or palpitations.  Gastrointestinal: Negative for abdominal pain, no bowel changes.  Musculoskeletal: Negative for gait problem or joint swelling.  Skin: Negative for rash.  Neurological: Negative for dizziness or headache.  No other specific  complaints in a complete review of systems (except as listed in HPI above).    Objective  Vitals:   08/14/23 1048  BP: 116/76  Pulse: 95  Resp: 16  SpO2: 98%  Weight: 202 lb 11.2 oz (91.9 kg)  Height: 5' 11.5 (1.816 m)    Body mass index is 27.88 kg/m.  Physical Exam  Constitutional: Patient appears well-developed and well-nourished. No distress.  HENT: Head: Normocephalic and atraumatic. Ears: B TMs ok, no erythema or effusion; Nose: Nose normal. Mouth/Throat:  Oropharynx is clear and moist. No oropharyngeal exudate.  Eyes: Conjunctivae and EOM are normal. Pupils are equal, round, and reactive to light. No scleral icterus.  Neck: Normal range of motion. Neck supple. No JVD present. No thyromegaly present.  Cardiovascular: Normal rate, regular rhythm and normal heart sounds.  No murmur heard. No BLE edema. Pulmonary/Chest: Effort normal , crackles scattered but worse on right lung field No respiratory distress. Abdominal: Soft. Bowel sounds are normal, no distension. There is no tenderness. no masses MALE GENITALIA: not done - going to see urologist next week  RECTAL: not done - to be performed by urologist next week  Musculoskeletal: Normal range of motion, no joint effusions. No gross deformities Neurological: he is alert and oriented to person, place, and time. No cranial nerve deficit. Coordination, balance, strength, speech and gait are normal.  Skin: Skin is warm and dry. No rash noted. No erythema.  Psychiatric: Patient has a normal mood and affect. behavior is normal. Judgment and thought content normal.     Assessment & Plan  1. Well adult exam (Primary)  He sees dermatologist, sunburned on his upper back, reminded him to use sunscreen  2. Lower urinary tract symptoms (LUTS)   He states that online medication that contains sildenafil helps. Advised to discuss it with urologist and consider Cialis. He states flomax  did not work in the past  -Prostate cancer screening and PSA options (with potential risks and benefits of testing vs not testing) were discussed along with recent recs/guidelines. -USPSTF grade A and B recommendations reviewed with patient; age-appropriate recommendations, preventive care, screening tests, etc discussed and encouraged; healthy living encouraged; see AVS for patient education given to patient -Discussed importance of 150 minutes of physical activity weekly, eat two servings of fish weekly, eat one serving of  tree nuts ( cashews, pistachios, pecans, almonds.Aaron Aas) every other day, eat 6 servings of fruit/vegetables daily and drink plenty of water  and avoid sweet beverages.  -Reviewed Health Maintenance: yes

## 2023-08-14 NOTE — Telephone Encounter (Signed)
 Please schedule: needs medicare wellness with Avanell Bob.

## 2023-08-18 ENCOUNTER — Ambulatory Visit: Payer: Self-pay | Admitting: Internal Medicine

## 2023-08-18 NOTE — Telephone Encounter (Signed)
 FYI Only or Action Required?: Action required by provider  Patient is followed in Pulmonology for nocturnal hypoxia and emphysema, last seen on 07/08/2023 by Kasa, Kurian, MD. Called Nurse Triage reporting low oxygen  levels, asking if need oxygen  with CPAP, and poor sleep. Symptoms began several months ago. Interventions attempted: Increased fluids/rest. Symptoms are: gradually worsening.  Triage Disposition: Call PCP Now  Patient/caregiver understands and will follow disposition?: Yes - Needs call back, scheduling system denied scheduling       Copied from CRM [id number]. Topic: Clinical - Red Word Triage >> Aug 18, 2023 10:18 AM Tyronne Galloway wrote: Red Word that prompted transfer to Nurse Triage: Pt stated his oxygen  has been dropping to the low 80s every night for the last three or four months. Pt used to be on oxygen  but he couldn't do the machine anymore due to the noise. Pt is waiting on his CPAP machine to arrive from [employer]. Pt has an appt on 10/14 at 1115am with Dr. Auston Left. Reason for Disposition  [1] Fall in oxygen  level 4% or more (below known patient baseline, while awake and resting) AND [2] new or worse difficulty breathing  Answer Assessment - Initial Assessment Questions E2C2 Pulmonary Triage - Initial Assessment Questions Chief Complaint (e.g., cough, sob, wheezing, fever, chills, sweat or additional symptoms) *Go to specific symptom protocol after initial questions. Samsung galaxy watch, scores are down in the 40s but oxygen  levels dropping to low 80s as low as 80%, for 30 min at a time in mid-80s Asking if CPAP, forcing the air No SOB unless truly exerting myself during day, his usual Got my inhaler, not had to use much in the past year No more than usual as response to nurse asking if chest pain/dizziness/weakness present Wake up pissed off at the world Taking a tea to help with lungs  How long have symptoms been present? 3-4 months  Have you used  your inhalers/maintenance medication? Yes If yes, What medications? Just rescue inhaler as needed, not using maintenance inhaler  If inhaler, ask How many puffs and how often? Note: Review instructions on medication in the chart. Last time needed long while over 3 months  OXYGEN : Do you wear supplemental oxygen ? No  Do you monitor your oxygen  levels? Yes If yes, What is your reading (oxygen  level) today? Day range still consistent but dropping as low as 80% during night  What is your usual oxygen  saturation reading?  (Note: Pulmonary O2 sats should be 90% or greater) 92-98% during the day  Big question: do I need oxygen  with CPAP or is just the pressure enough?  Protocols used: Oxygen  Monitoring and Hypoxia-A-AH

## 2023-08-19 ENCOUNTER — Other Ambulatory Visit: Payer: Self-pay

## 2023-08-19 DIAGNOSIS — E291 Testicular hypofunction: Secondary | ICD-10-CM

## 2023-08-20 ENCOUNTER — Other Ambulatory Visit

## 2023-08-20 DIAGNOSIS — E291 Testicular hypofunction: Secondary | ICD-10-CM

## 2023-08-20 NOTE — Telephone Encounter (Signed)
Patient scheduled for AWV.

## 2023-08-21 LAB — TESTOSTERONE: Testosterone: >1500 ng/dL — ABNORMAL HIGH (ref 264–916)

## 2023-08-21 LAB — HEMOGLOBIN AND HEMATOCRIT, BLOOD
Hematocrit: 52.1 % — ABNORMAL HIGH (ref 37.5–51.0)
Hemoglobin: 17.4 g/dL (ref 13.0–17.7)

## 2023-08-21 LAB — PSA: Prostate Specific Ag, Serum: 1.8 ng/mL (ref 0.0–4.0)

## 2023-08-27 ENCOUNTER — Other Ambulatory Visit: Payer: Self-pay | Admitting: Urology

## 2023-08-28 ENCOUNTER — Ambulatory Visit: Payer: Self-pay | Admitting: Physician Assistant

## 2023-08-31 ENCOUNTER — Other Ambulatory Visit: Payer: Self-pay | Admitting: *Deleted

## 2023-09-02 ENCOUNTER — Other Ambulatory Visit: Payer: Self-pay | Admitting: *Deleted

## 2023-09-02 ENCOUNTER — Telehealth: Payer: Self-pay | Admitting: Urology

## 2023-09-02 NOTE — Telephone Encounter (Signed)
 Sent to DR. Stoioff

## 2023-09-02 NOTE — Telephone Encounter (Signed)
 Patient called stating that pharmacy reached out to him and states that current RX for Testosterone  will not be filled due to them not receiving an authorization. Please advise.

## 2023-09-02 NOTE — Telephone Encounter (Signed)
 Patient said that pharmacy has  sent over a prior auth and never received it, therefore, they cancelled out the whole RX and will NOT be able to fill it until a brand new script has been sent. Please advise patient.

## 2023-09-02 NOTE — Telephone Encounter (Signed)
 We are waiting on a fax to be sent over to start prior auth  from his pharmacy

## 2023-09-02 NOTE — Telephone Encounter (Signed)
 Patient states he is out for the medication. He is calling his pharmacy to get  a refill and than can start a prior auth on medication

## 2023-09-07 ENCOUNTER — Other Ambulatory Visit: Payer: Self-pay | Admitting: *Deleted

## 2023-09-07 MED ORDER — TESTOSTERONE CYPIONATE 200 MG/ML IM SOLN: 200.0000 mg | INTRAMUSCULAR | 0 refills | Status: DC

## 2023-10-06 ENCOUNTER — Other Ambulatory Visit

## 2023-10-06 ENCOUNTER — Other Ambulatory Visit: Payer: Self-pay

## 2023-10-06 DIAGNOSIS — E291 Testicular hypofunction: Secondary | ICD-10-CM

## 2023-10-07 LAB — TESTOSTERONE: Testosterone: 406 ng/dL (ref 264–916)

## 2023-10-11 ENCOUNTER — Ambulatory Visit: Payer: Self-pay | Admitting: Urology

## 2023-10-15 ENCOUNTER — Ambulatory Visit (INDEPENDENT_AMBULATORY_CARE_PROVIDER_SITE_OTHER): Admitting: Internal Medicine

## 2023-10-15 ENCOUNTER — Encounter: Payer: Self-pay | Admitting: Internal Medicine

## 2023-10-15 VITALS — BP 110/70 | HR 65 | Temp 98.3°F | Ht 71.5 in | Wt 198.2 lb

## 2023-10-15 DIAGNOSIS — G4734 Idiopathic sleep related nonobstructive alveolar hypoventilation: Secondary | ICD-10-CM

## 2023-10-15 DIAGNOSIS — J449 Chronic obstructive pulmonary disease, unspecified: Secondary | ICD-10-CM | POA: Diagnosis not present

## 2023-10-15 DIAGNOSIS — G4733 Obstructive sleep apnea (adult) (pediatric): Secondary | ICD-10-CM

## 2023-10-15 NOTE — Patient Instructions (Addendum)
  Excellent Job A+ GOLD STAR!! Continue CPAP as prescribed  Patient Instructions Continue to use CPAP every night, minimum of 4-6 hours a night.  Change equipment every 30 days or as directed by DME.  Wash your tubing with warm soap and water  daily, hang to dry. Wash humidifier portion weekly. Use bottled, distilled water  and change daily   Be aware of reduced alertness and do not drive or operate heavy machinery if experiencing this or drowsiness.  Exercise encouraged, as tolerated. Encouraged proper weight management.  Important to get eight or more hours of sleep  Limiting the use of the computer and television before bedtime.  Decrease naps during the day, so night time sleep will become enhanced.  Limit caffeine, and sleep deprivation.    Avoid Allergens and Irritants Avoid secondhand smoke Avoid SICK contacts Recommend  Masking  when appropriate Recommend Keep up-to-date with vaccinations  Obtain AirFit P 30I nasal mask Continue inhalers as prescribed Follow-up lung cancer screening program Follow-up with cardiology

## 2023-10-15 NOTE — Progress Notes (Signed)
 Name: Ronald Ellis MRN: [id number] DOB: 22-Oct-1960     STUDIES:  12/21/2018 CT chest Independently reviewed by Me Bilateral upper lobe predominant emphysematous changes along with mild interstitial lung disease throughout the lungs Images reviewed with the patient    CHIEF COMPLAINT:  Follow-up assessment of COPD Follow-up lung cancer screening protocol Follow-up OSA  HISTORY OF PRESENT ILLNESS: Assessment of COPD and ILD Progressive shortness of breath and dyspnea on exertion No exacerbation at this time No evidence of heart failure at this time No evidence or signs of infection at this time No respiratory distress No fevers, chills, nausea, vomiting, diarrhea No evidence of lower extremity edema No evidence hemoptysis I requested patient to restart his Symbicort  Rinse mouth after use  Abnormal CT chest March 2025 CT chest reviewed in detail with patient His right upper lobe nodular opacification groundglass opacification Follow-up PET scan shows resolution opacification Likely finding was inflammatory infectious process   Discussed sleep data and reviewed with patient.  Encouraged proper weight management.  Discussed driving precautions and its relationship with hypersomnolence.  Discussed sleep hygiene, and benefits of a fixed sleep waked time.  The importance of getting eight or more hours of sleep discussed with patient.  Discussed limiting the use of the computer and television before bedtime.  Decrease naps during the day, so night time sleep will become enhanced.  Limit caffeine, and sleep deprivation.   Patient uses and benefits from therapy Using CPAP nightly and with naps Pressure setting is comfortable and is sleeping well.   Enrolled in lung cancer screening program Last Ct Chest 04/2021      March 2025 CT chest reviewed in detail with patient Right upper lobe opacification Follow-up PET scan shows resolution of right upper lobe nodule  opacification     PAST MEDICAL HISTORY :   has a past medical history of Allergy, Arthritis, Chronic pain, COPD (chronic obstructive pulmonary disease) (HCC), Depression (08/06/1960), Gluten intolerance, Oxygen  deficiency, and Pneumonia.  has a past surgical history that includes Back surgery (2015); Neck surgery (2016); Inguinal hernia repair (Bilateral); lumbar discetomy (2005); Back surgery; Mole removal; Spine surgery (12/15/01); and Colonoscopy with propofol  (N/A, 09/15/2022). Prior to Admission medications   Medication Sig Start Date End Date Taking? Authorizing Provider  acetaminophen  (TYLENOL ) 500 MG tablet Take 500 mg by mouth every 6 (six) hours as needed.   Yes [provider]  albuterol  (PROVENTIL  HFA;VENTOLIN  HFA) 108 (90 Base) MCG/ACT inhaler Inhale 2 puffs into the lungs every 6 (six) hours as needed for wheezing or shortness of breath. 06/29/17  Yes Sowles, Krichna, MD  ARIPiprazole  (ABILIFY ) 5 MG tablet Take 1 tablet (5 mg total) by mouth daily. 03/30/19  Yes Sowles, Krichna, MD  Ascorbic Acid (VITAMIN C PO) Take by mouth.   Yes [provider]  b complex vitamins tablet Take 1 tablet by mouth daily.   Yes [provider]  benzonatate  (TESSALON ) 200 MG capsule Take 1 capsule (200 mg total) by mouth 3 (three) times daily as needed. 05/30/19  Yes Sowles, Krichna, MD  BIOTIN PO Take by mouth.   Yes [provider]  DULoxetine  (CYMBALTA ) 60 MG capsule Take 1 capsule (60 mg total) by mouth daily. 03/30/19  Yes Sowles, Krichna, MD  fluticasone  (FLONASE ) 50 MCG/ACT nasal spray Place 2 sprays into both nostrils daily. 03/30/19  Yes Sowles, Krichna, MD  hydrOXYzine  (ATARAX /VISTARIL ) 10 MG tablet Take 1 tablet (10 mg total) by mouth 3 (three) times daily as needed. 12/28/18  Yes Sowles,  Krichna, MD  MAGNESIUM PO Take by mouth.   Yes [provider]  pregabalin  (LYRICA ) 100 MG capsule Take 1 capsule (100 mg total) by mouth 3 (three) times daily.  05/30/19  Yes Sowles, Krichna, MD  rizatriptan  (MAXALT ) 10 MG tablet Take 1 tablet (10 mg total) by mouth as needed for migraine. May repeat in 2 hours if needed 04/12/19  Yes Sowles, Krichna, MD  terbinafine  (LAMISIL ) 250 MG tablet Take 1 tablet (250 mg total) by mouth daily. 03/09/19  Yes Hyatt, Max T, DPM  tiotropium (SPIRIVA  HANDIHALER) 18 MCG inhalation capsule Place 1 capsule (18 mcg total) into inhaler and inhale daily. 03/30/19  Yes Sowles, Krichna, MD  tiZANidine  (ZANAFLEX ) 4 MG tablet Take 1 tablet (4 mg total) by mouth at bedtime. 06/29/17  Yes Sowles, Krichna, MD  traZODone  (DESYREL ) 100 MG tablet Take 1 tablet (100 mg total) by mouth at bedtime. Pt taking 2 tablets qhs 04/27/19  Yes Sowles, Krichna, MD  fluticasone  furoate-vilanterol (BREO ELLIPTA ) 100-25 MCG/INH AEPB Inhale 1 puff into the lungs daily. Patient not taking: Reported on 06/21/2019 03/30/19   Sowles, Krichna, MD   Allergies  Allergen Reactions   Statins Other (See Comments)    Fever, chills, body aches   Gluten Meal Diarrhea and Nausea Only    FAMILY HISTORY:  family history includes Hypertension in his brother and father; Lung cancer in his maternal grandfather; Lung disease in his father. SOCIAL HISTORY:  reports that he quit smoking about 11 years ago. His smoking use included cigarettes and cigars. He started smoking about 47 years ago. He has a 42 pack-year smoking history. He has never used smokeless tobacco. He reports that he does not currently use alcohol. He reports that he does not currently use drugs after having used the following drugs: Crack cocaine, Cocaine, Hashish, Heroin, Hydrocodone , LSD, Marijuana, Oxycodone , and Other-see comments.  BP 110/70 (BP Location: Right Arm, Patient Position: Sitting, Cuff Size: Large)   Pulse 65   Temp 98.3 F (36.8 C) (Oral)   Ht 5' 11.5 (1.816 m)   Wt 198 lb 3.2 oz (89.9 kg)   SpO2 96%   BMI 27.26 kg/m     Review of Systems: Gen:  Denies  fever, sweats, chills  weight loss  HEENT: Denies blurred vision, double vision, ear pain, eye pain, hearing loss, nose bleeds, sore throat Cardiac:  No dizziness, chest pain or heaviness, chest tightness,edema, No JVD Resp:   No cough, -sputum production, +shortness of breath,-wheezing, -hemoptysis,  Other:  All other systems negative   Physical Examination:   General Appearance: No distress  EYES PERRLA, EOM intact.   NECK Supple, No JVD Pulmonary: normal breath sounds, No wheezing.  CardiovascularNormal S1,S2.  No m/r/g.   Abdomen: Benign, Soft, non-tender. Neurology UE/LE 5/5 strength, no focal deficits Ext pulses intact, cap refill intact ALL OTHER ROS ARE NEGATIVE       ASSESSMENT AND PLAN SYNOPSIS  63 year old pleasant white male seen today for follow-up assessment for COPD along with interstitial lung disease with CT scan findings consistent with bilateral cystic and send emphysematous changes along with honeycomb pattern and mild form of the peripheral edges of the lung with a recent change in CT scan with a follow-up PET scan that shows resolution of nodular opacity in the right upper lobe . Patient also has signs symptoms of obstructive sleep apnea  Home sleep test showed AHI between 6 and 8   ILD/COPD assessment No exacerbation at this time No evidence of  heart failure at this time No evidence or signs of infection at this time No respiratory distress No fevers, chills, nausea, vomiting, diarrhea No evidence of lower extremity edema No evidence hemoptysis Recommend restarting Symbicort  Albuterol  as needed Avoid Allergens and Irritants Avoid secondhand smoke Avoid SICK contacts Recommend  Masking  when appropriate Recommend Keep up-to-date with vaccinations   Assessment of OSA Previous AHI 8 Continue CPAP as prescribed  Excellent compliance report Reviewed compliance report in detail with patient Patient definitely benefits the use of CPAP therapy as prescribed Using CPAP  nightly and with naps Pressure setting is comfortable and is sleeping well. CPAP prescription 4-10 AHI reduced to 0.6  No evidence of acute heart failure at this time No respiratory distress No fevers, chills, nausea, vomiting, diarrhea No evidence hemoptysis  Patient Instructions Continue to use CPAP every night, minimum of 4-6 hours a night.  Change equipment every 30 days or as directed by DME.  Wash your tubing with warm soap and water  daily, hang to dry. Wash humidifier portion weekly. Use bottled, distilled water  and change daily   Be aware of reduced alertness and do not drive or operate heavy machinery if experiencing this or drowsiness.  Exercise encouraged, as tolerated. Encouraged proper weight management.  Important to get eight or more hours of sleep  Limiting the use of the computer and television before bedtime.  Decrease naps during the day, so night time sleep will become enhanced.  Limit caffeine, and sleep deprivation.  HTN, stroke, uncontrolled diabetes and heart failure are potential risk factors.  Risk of untreated sleep apnea including cardiac arrhthymias, stroke, DM, pulm HTN.    Abnormal CT chest Follow up Lung cancer screening program Recommend follow-up CT chest  follow-up lung cancer screening program   Excessive daytime sleepiness and sleep apnea Patient started on CPAP therapy Auto CPAP 4-10 AHI reduced to 0.6 100% compliance for days 100% compliance for greater than 4 hours Patient is a benefits from therapy   MEDICATION ADJUSTMENTS/LABS AND TESTS ORDERED: Recommend repeat CT chest Symbicort  2 puffs in the morning 2 puffs at night Please rinse mouth after use Continue CPAP as prescribed Follow-up lung cancer screening protocol Avoid Allergens and Irritants Avoid secondhand smoke Avoid SICK contacts Recommend  Masking  when appropriate Recommend Keep up-to-date with vaccinations    CURRENT MEDICATIONS REVIEWED AT LENGTH WITH PATIENT  TODAY   Patient  satisfied with Plan of action and management. All questions answered   Follow up 6 months   I spent a total of 43 minutes dedicated to the care of this patient on the date of this encounter to include pre-visit review of records, face-to-face time with the patient discussing conditions above, post visit ordering of testing, clinical documentation with the electronic health record, making appropriate referrals as documented, and communicating necessary information to the patient's healthcare team.    The Patient requires high complexity decision making for assessment and support, frequent evaluation and titration of therapies, application of advanced monitoring technologies and extensive interpretation of multiple databases.  Patient satisfied with Plan of action and management. All questions answered    Nickolas Alm Cellar, M.D.  [facility]  Medical Director [facility] Medical Director [facility] Cardio-Pulmonary Department

## 2023-11-11 ENCOUNTER — Ambulatory Visit (INDEPENDENT_AMBULATORY_CARE_PROVIDER_SITE_OTHER): Admitting: Podiatry

## 2023-11-11 DIAGNOSIS — M722 Plantar fascial fibromatosis: Secondary | ICD-10-CM | POA: Diagnosis not present

## 2023-11-11 MED ORDER — TRIAMCINOLONE ACETONIDE 40 MG/ML IJ SUSP
20.0000 mg | Freq: Once | INTRAMUSCULAR | Status: AC
  Administered 2023-11-11: 20 mg

## 2023-11-11 NOTE — Progress Notes (Signed)
 He presents today for follow-up of his plantar fasciitis states that he has had a flareup over the past few days.  States he has not been taking his Celebrex he has meloxicam  at home but has not been taking that either.  Objective: Vital signs are stable he is alert oriented x 3 left foot exquisitely painful on palpation medial calcaneal tubercle less so on the right foot.  Assessment: Plantar fasciitis bilaterally.  Plan: We injected the left heel today 20 mg Kenalog  pentagrams Marcaine 1 same procedure to the right foot as well.  Tolerated procedure well instructed him to start 50 mg of his meloxicam  1 p.o. daily and I would like to follow-up with him in 3 months if necessary.  Should he get another reoccurrence I would highly recommend that he get an MRI of and symptomatic foot.

## 2023-11-16 ENCOUNTER — Ambulatory Visit
Admission: RE | Admit: 2023-11-16 | Discharge: 2023-11-16 | Disposition: A | Discharge status: Home / Self Care | Source: Ambulatory Visit | Attending: Family Medicine | Admitting: Family Medicine

## 2023-11-16 ENCOUNTER — Other Ambulatory Visit: Payer: Self-pay | Admitting: Family Medicine

## 2023-11-16 DIAGNOSIS — J449 Chronic obstructive pulmonary disease, unspecified: Secondary | ICD-10-CM | POA: Insufficient documentation

## 2023-11-16 DIAGNOSIS — R7989 Other specified abnormal findings of blood chemistry: Secondary | ICD-10-CM

## 2023-11-16 DIAGNOSIS — R9389 Abnormal findings on diagnostic imaging of other specified body structures: Secondary | ICD-10-CM | POA: Insufficient documentation

## 2023-11-16 DIAGNOSIS — J849 Interstitial pulmonary disease, unspecified: Secondary | ICD-10-CM | POA: Diagnosis present

## 2023-11-17 ENCOUNTER — Other Ambulatory Visit: Payer: Self-pay | Admitting: Urology

## 2023-11-18 ENCOUNTER — Ambulatory Visit: Payer: Self-pay | Admitting: Internal Medicine

## 2023-11-18 DIAGNOSIS — G4734 Idiopathic sleep related nonobstructive alveolar hypoventilation: Secondary | ICD-10-CM

## 2023-11-18 DIAGNOSIS — G4733 Obstructive sleep apnea (adult) (pediatric): Secondary | ICD-10-CM

## 2023-11-24 NOTE — Addendum Note (Signed)
 Addended by: Malya Cirillo J on: 11/24/2023 10:06 AM   Modules accepted: Orders

## 2023-11-25 ENCOUNTER — Telehealth: Payer: Self-pay | Admitting: Internal Medicine

## 2023-11-25 NOTE — Telephone Encounter (Signed)
 Dr. Isaiah ordered ONO to be done and 02 was ordered based off the ONO results. The order was sent to [employer] per Avelina since the patient has OSA Dx the patient will need to have Cpap Titration Study done to prove he needs 02 at night. Per Avelina the patient had O2 picked up from us  on 01/21/2021 AMA.

## 2023-11-26 ENCOUNTER — Other Ambulatory Visit: Payer: Self-pay | Admitting: Internal Medicine

## 2023-11-26 DIAGNOSIS — G4733 Obstructive sleep apnea (adult) (pediatric): Secondary | ICD-10-CM

## 2023-11-26 NOTE — Telephone Encounter (Signed)
 I have notified the patient. Nothing further needed.

## 2023-11-26 NOTE — Telephone Encounter (Signed)
 The form and records have been sent to [employer] to get approved and schedule the patient

## 2023-11-26 NOTE — Progress Notes (Unsigned)
 Insurance company is requesting CPAP titration study

## 2023-11-27 ENCOUNTER — Ambulatory Visit

## 2023-11-27 VITALS — BP 110/70 | Ht 71.5 in | Wt 200.0 lb

## 2023-11-27 DIAGNOSIS — Z0000 Encounter for general adult medical examination without abnormal findings: Secondary | ICD-10-CM | POA: Diagnosis not present

## 2023-11-27 NOTE — Progress Notes (Signed)
 Because this visit was a virtual/telehealth visit,  certain criteria was not obtained, such a blood pressure, CBG if applicable, and timed get up and go. Any medications not marked as taking were not mentioned during the medication reconciliation part of the visit. Any vitals not documented were not able to be obtained due to this being a telehealth visit or patient was unable to self-report a recent blood pressure reading due to a lack of equipment at home via telehealth. Vitals that have been documented are verbally provided by the patient.  This visit was performed by a medical professional under my direct supervision. I was immediately available for consultation/collaboration. I have reviewed and agree with the Annual Wellness Visit documentation.  Subjective:   Ronald Ellis is a 63 y.o. who presents for a Medicare Wellness preventive visit.  As a reminder, Annual Wellness Visits don't include a physical exam, and some assessments may be limited, especially if this visit is performed virtually. We may recommend an in-person follow-up visit with your provider if needed.  Visit Complete: Virtual I connected with  Ronald Ellis on 11/27/23 by a audio enabled telemedicine application and verified that I am speaking with the correct person using two identifiers.  Patient Location: Home  Provider Location: Home Office  I discussed the limitations of evaluation and management by telemedicine. The patient expressed understanding and agreed to proceed.  Vital Signs: Because this visit was a virtual/telehealth visit, some criteria may be missing or patient reported. Any vitals not documented were not able to be obtained and vitals that have been documented are patient reported.  VideoDeclined- This patient declined Librarian, academic. Therefore the visit was completed with audio only.  Persons Participating in Visit: Patient.  AWV Questionnaire: Yes: Patient Medicare AWV  questionnaire was completed by the patient on 11/23/2023; I have confirmed that all information answered by patient is correct and no changes since this date.  Cardiac Risk Factors include: male gender;advanced age (>70men, >33 women)     Objective:    Today's Vitals   11/23/23 1939 11/27/23 0821  BP:  110/70  Weight:  200 lb (90.7 kg)  Height:  5' 11.5 (1.816 m)  PainSc: 5     Body mass index is 27.51 kg/m.     11/27/2023    8:25 AM 09/15/2022    9:22 AM 02/05/2021    3:21 PM 02/02/2020    2:44 PM 01/14/2019   10:22 AM 10/01/2016   11:17 AM 02/15/2016   10:19 AM  Advanced Directives  Does Patient Have a Medical Advance Directive? No No Yes No No No  No   Type of Surveyor, minerals;Living will      Copy of Healthcare Power of Attorney in Chart?   No - copy requested      Would patient like information on creating a medical advance directive? No - Patient declined No - Patient declined  No - Patient declined Yes (MAU/Ambulatory/Procedural Areas - Information given)       Data saved with a previous flowsheet row definition    Current Medications (verified) Outpatient Encounter Medications as of 11/27/2023  Medication Sig   Ascorbic Acid (VITAMIN C PO) Take by mouth.   L-Citrulline POWD by Does not apply route daily.   MAGNESIUM PO Take by mouth.   Misc Natural Products (MAGIC MUSHROOM MIX) CAPS Take 2 capsules by mouth daily.   MORINGA OLEIFERA PO Take 1,200 mg by mouth daily at 12  noon.   saw palmetto 80 MG capsule Take 80 mg by mouth daily.   testosterone  cypionate (DEPOTESTOSTERONE CYPIONATE) 200 MG/ML injection ADMINISTER 1 ML(200 MG) IN THE MUSCLE EVERY 14 DAYS   zinc gluconate 50 MG tablet Take 50 mg by mouth daily.   No facility-administered encounter medications on file as of 11/27/2023.    Allergies (verified) Statins and Gluten meal   History: Past Medical History:  Diagnosis Date   Allergy    Statins, gluten   Arthritis     Chronic pain    COPD (chronic obstructive pulmonary disease) (HCC)    Depression 08/06/1981   Gluten intolerance    Oxygen  deficiency    Pneumonia    Past Surgical History:  Procedure Laterality Date   BACK SURGERY  2015   BACK SURGERY     COLONOSCOPY WITH PROPOFOL  N/A 09/15/2022   Procedure: COLONOSCOPY WITH PROPOFOL ;  Surgeon: Jinny Carmine, MD;  Location: [facility];  Service: Endoscopy;  Laterality: N/A;   INGUINAL HERNIA REPAIR Bilateral    multiple times   lumbar discetomy  2005   MOLE REMOVAL     NECK SURGERY  2016   SPINE SURGERY  12/15/01   Family History  Problem Relation Age of Onset   Hypertension Father    Lung disease Father    Hypertension Brother    Lung cancer Maternal Grandfather    Social History   Socioeconomic History   Marital status: Significant Other    Spouse name: Verneita Fischer   Number of children: 4   Years of education: Not on file   Highest education level: Associate degree: occupational, Scientist, product/process development, or vocational program  Occupational History   Occupation: disbaled     Comment: Loss adjuster, [employer]ered [employer]   Tobacco Use   Smoking status: Former    Current packs/day: 0.00    Average packs/day: 1 pack/day for 42.0 years (42.0 ttl pk-yrs)    Types: Cigarettes, Cigars    Start date: 63    Quit date: 12/13/2011    Years since quitting: 11.9   Smokeless tobacco: Never   Tobacco comments:    09/09/22 - may have occasional cigar  Vaping Use   Vaping status: Never Used  Substance and Sexual Activity   Alcohol use: Not Currently    Comment: quit 5-6 years ago, only beer - but glutten free diet now   Drug use: Not Currently    Types: Crack cocaine, Cocaine, Hashish, Heroin, Hydrocodone , LSD, Marijuana, Oxycodone , Other-see comments    Comment: meths in his 20's-30's (No illicit drugs in 25-30 yrs)   Sexual activity: Yes    Partners: Female    Birth control/protection: None  Other Topics Concern   Not on file  Social  History Narrative   Approved for disability 06/2017 ( workman's comp back 2014) , secondary to neck and back injury    Social Drivers of Health   Financial Resource Strain: Medium Risk (11/27/2023)   Overall Financial Resource Strain (CARDIA)    Difficulty of Paying Living Expenses: Somewhat hard  Food Insecurity: No Food Insecurity (11/27/2023)   Hunger Vital Sign    Worried About Running Out of Food in the Last Year: Never true    Ran Out of Food in the Last Year: Never true  Transportation Needs: No Transportation Needs (11/27/2023)   PRAPARE - Administrator, Civil Service (Medical): No    Lack of Transportation (Non-Medical): No  Physical Activity: Sufficiently Active (11/27/2023)  Exercise Vital Sign    Days of Exercise per Week: 4 days    Minutes of Exercise per Session: 60 min  Stress: No Stress Concern Present (11/27/2023)   [employer] of Occupational Health - Occupational Stress Questionnaire    Feeling of Stress: Not at all  Social Connections: Socially Isolated (11/27/2023)   Social Connection and Isolation Panel    Frequency of Communication with Friends and Family: More than three times a week    Frequency of Social Gatherings with Friends and Family: Once a week    Attends Religious Services: Never    Database administrator or Organizations: No    Attends Banker Meetings: Never    Marital Status: Divorced    Tobacco Counseling Counseling given: Not Answered Tobacco comments: 09/09/22 - may have occasional cigar    Clinical Intake:  Pre-visit preparation completed: Yes  Pain : 0-10 Pain Score: 5  Pain Type: Chronic pain Pain Location: Back Pain Orientation: Lower Pain Descriptors / Indicators: Aching Pain Onset: Today Pain Frequency: Several days a week Pain Relieving Factors: take some tylenol   Pain Relieving Factors: take some tylenol   BMI - recorded: 27.51 Nutritional Status: BMI 25 -29 Overweight Nutritional Risks:  None Diabetes: No  Lab Results  Component Value Date   HGBA1C 5.4 05/25/2018   HGBA1C 5.6 06/29/2017     How often do you need to have someone help you when you read instructions, pamphlets, or other written materials from your doctor or pharmacy?: 1 - Never  Interpreter Needed?: No  Information entered by :: Ettel Albergo,CMA   Activities of Daily Living     11/23/2023    7:39 PM 08/14/2023   10:46 AM  In your present state of health, do you have any difficulty performing the following activities:  Hearing? 0 0  Vision? 0 0  Difficulty concentrating or making decisions? 0 0  Walking or climbing stairs? 0 0  Dressing or bathing? 0 0  Doing errands, shopping? 0 0  Preparing Food and eating ? N   Using the Toilet? N   In the past six months, have you accidently leaked urine? N   Do you have problems with loss of bowel control? N   Managing your Medications? N   Managing your Finances? N   Housekeeping or managing your Housekeeping? N     Patient Care Team: Sowles, Krichna, MD as PCP - General (Family Medicine) End, Lonni, MD as PCP - Cardiology (Cardiology) Isaiah Scrivener, MD as Consulting Physician (Pulmonary Disease) Cathlyn Seal, MD (Dermatology) Dow Frederic BIRCH, MD as Referring Physician (Orthopedic Surgery) Leora Lynwood SAUNDERS, MD as Consulting Physician (Orthopedic Surgery)  I have updated your Care Teams any recent Medical Services you may have received from other providers in the past year.     Assessment:   This is a routine wellness examination for [place].  Hearing/Vision screen Hearing Screening - Comments:: No difficulties  Vision Screening - Comments:: No difficulties    Goals Addressed             This Visit's Progress    Patient Stated       Maintain below 200lb       Depression Screen     11/27/2023    8:26 AM 08/14/2023   10:46 AM 06/04/2023    8:28 AM 08/13/2022   11:26 AM 08/08/2022    2:17 PM 07/07/2022    2:00 PM 05/13/2022    11:17 AM  PHQ 2/9 Scores  PHQ - 2 Score 0 0 0 0 0 2 3  PHQ- 9 Score 0  0  0 6 6    Fall Risk     11/23/2023    7:39 PM 08/14/2023   10:46 AM 08/13/2022   11:26 AM 08/08/2022    2:17 PM 07/07/2022    1:59 PM  Fall Risk   Falls in the past year? 1 0 0 0 0  Number falls in past yr: 1 0 0 0 0  Injury with Fall? 0 0 0 0 0  Risk for fall due to : History of fall(s) No Fall Risks No Fall Risks  No Fall Risks  Follow up Falls evaluation completed;Education provided;Falls prevention discussed Falls prevention discussed;Education provided;Falls evaluation completed Falls prevention discussed  Falls prevention discussed    MEDICARE RISK AT HOME:  Medicare Risk at Home Any stairs in or around the home?: (Patient-Rptd) Yes If so, are there any without handrails?: (Patient-Rptd) No Home free of loose throw rugs in walkways, pet beds, electrical cords, etc?: (Patient-Rptd) Yes Adequate lighting in your home to reduce risk of falls?: (Patient-Rptd) Yes Life alert?: (Patient-Rptd) No Use of a cane, walker or w/c?: (Patient-Rptd) Yes Grab bars in the bathroom?: (Patient-Rptd) No Shower chair or bench in shower?: (Patient-Rptd) No Elevated toilet seat or a handicapped toilet?: (Patient-Rptd) No  TIMED UP AND GO:  Was the test performed?  No  Cognitive Function: 6CIT completed        11/27/2023    8:24 AM 02/05/2022   10:14 AM  6CIT Screen  What Year? 0 points 0 points  What month? 0 points 0 points  What time? 0 points 0 points  Count back from 20 0 points 0 points  Months in reverse 0 points 4 points  Repeat phrase 0 points 0 points  Total Score 0 points 4 points    Immunizations Immunization History  Administered Date(s) Administered   Influenza,inj,Quad PF,6+ Mos 04/27/2019, 01/02/2020, 11/02/2020, 11/12/2021   Influenza-Unspecified 01/02/2023   PFIZER(Purple Top)SARS-COV-2 Vaccination 05/26/2019, 06/20/2019, 02/14/2020   Pneumococcal Conjugate-13 01/02/2020   Pneumococcal  Polysaccharide-23 06/29/2017   Tdap 11/26/2018    Screening Tests Health Maintenance  Topic Date Due   Zoster Vaccines- Shingrix  (1 of 2) Never done   Influenza Vaccine  10/02/2023   COVID-19 Vaccine (4 - 2025-26 season) 11/02/2023   Lung Cancer Screening  11/15/2024   Medicare Annual Wellness (AWV)  11/26/2024   Pneumococcal Vaccine: 50+ Years (3 of 3 - PCV20 or PCV21) 01/01/2025   Colonoscopy  09/14/2025   DTaP/Tdap/Td (2 - Td or Tdap) 11/25/2028   Hepatitis C Screening  Completed   HIV Screening  Completed   Hepatitis B Vaccines 19-59 Average Risk  Aged Out   HPV VACCINES  Aged Out   Meningococcal B Vaccine  Aged Out   Fecal DNA (Cologuard)  Discontinued    Health Maintenance Items Addressed:patient declined   Additional Screening:  Vision Screening: Recommended annual ophthalmology exams for early detection of glaucoma and other disorders of the eye. Is the patient up to date with their annual eye exam?  No  Who is the provider or what is the name of the office in which the patient attends annual eye exams?   Dental Screening: Recommended annual dental exams for proper oral hygiene  Community Resource Referral / Chronic Care Management: CRR required this visit?  No   CCM required this visit?  No   Plan:    I have personally reviewed and  noted the following in the patient's chart:   Medical and social history Use of alcohol, tobacco or illicit drugs  Current medications and supplements including opioid prescriptions. Patient is not currently taking opioid prescriptions. Functional ability and status Nutritional status Physical activity Advanced directives List of other physicians Hospitalizations, surgeries, and ER visits in previous 12 months Vitals Screenings to include cognitive, depression, and falls Referrals and appointments  In addition, I have reviewed and discussed with patient certain preventive protocols, quality metrics, and best practice  recommendations. A written personalized care plan for preventive services as well as general preventive health recommendations were provided to patient.   Lyle MARLA Right, [place]   11/27/2023   After Visit Summary: (MyChart) Due to this being a telephonic visit, the after visit summary with patients personalized plan was offered to patient via MyChart   Notes: Nothing significant to report at this time.

## 2023-11-27 NOTE — Patient Instructions (Signed)
 [name],  Thank you for taking the time for your Medicare Wellness Visit. I appreciate your continued commitment to your health goals. Please review the care plan we discussed, and feel free to reach out if I can assist you further.  Medicare recommends these wellness visits once per year to help you and your care team stay ahead of potential health issues. These visits are designed to focus on prevention, allowing your provider to concentrate on managing your acute and chronic conditions during your regular appointments.  Please note that Annual Wellness Visits do not include a physical exam. Some assessments may be limited, especially if the visit was conducted virtually. If needed, we may recommend a separate in-person follow-up with your provider.  Ongoing Care Seeing your primary care provider every 3 to 6 months helps us  monitor your health and provide consistent, personalized care.   Referrals If a referral was made during today's visit and you haven't received any updates within two weeks, please contact the referred provider directly to check on the status.  Recommended Screenings:  Health Maintenance  Topic Date Due   Zoster (Shingles) Vaccine (1 of 2) Never done   Flu Shot  10/02/2023   COVID-19 Vaccine (4 - 2025-26 season) 11/02/2023   Screening for Lung Cancer  11/15/2024   Medicare Annual Wellness Visit  11/26/2024   Pneumococcal Vaccine for age over 12 (3 of 3 - PCV20 or PCV21) 01/01/2025   Colon Cancer Screening  09/14/2025   DTaP/Tdap/Td vaccine (2 - Td or Tdap) 11/25/2028   Hepatitis C Screening  Completed   HIV Screening  Completed   Hepatitis B Vaccine  Aged Out   HPV Vaccine  Aged Out   Meningitis B Vaccine  Aged Out   Cologuard (Stool DNA test)  Discontinued       11/27/2023    8:25 AM  Advanced Directives  Does Patient Have a Medical Advance Directive? No  Would patient like information on creating a medical advance directive? No - Patient declined    Advance Care Planning is important because it: Ensures you receive medical care that aligns with your values, goals, and preferences. Provides guidance to your family and loved ones, reducing the emotional burden of decision-making during critical moments.  Vision: Annual vision screenings are recommended for early detection of glaucoma, cataracts, and diabetic retinopathy. These exams can also reveal signs of chronic conditions such as diabetes and high blood pressure.  Dental: Annual dental screenings help detect early signs of oral cancer, gum disease, and other conditions linked to overall health, including heart disease and diabetes.  Please see the attached documents for additional preventive care recommendations.

## 2023-12-01 ENCOUNTER — Emergency Department
Admission: EM | Admit: 2023-12-01 | Discharge: 2023-12-01 | Disposition: A | Discharge status: Home / Self Care | Attending: Emergency Medicine | Admitting: Emergency Medicine

## 2023-12-01 ENCOUNTER — Emergency Department

## 2023-12-01 ENCOUNTER — Encounter: Payer: Self-pay | Admitting: Internal Medicine

## 2023-12-01 ENCOUNTER — Other Ambulatory Visit: Payer: Self-pay

## 2023-12-01 DIAGNOSIS — M79672 Pain in left foot: Secondary | ICD-10-CM | POA: Insufficient documentation

## 2023-12-01 NOTE — ED Provider Notes (Signed)
 [facility] Provider Note    Event Date/Time   First MD Initiated Contact with Patient 12/01/23 1844     (approximate)   History   Foot Pain and Ankle Pain   HPI  Ronald Ellis is a 63 year old male presenting to the ER for evaluation of foot pain.  Patient reports a history of plantar fasciitis for which he follows up with a foot specialist and is awaiting outpatient MRI next week.  He reports that he was walking when he felt a snapping sensation in his left foot.  Reports pain along his plantar aspect of his medial foot.  He is able to ambulate.    Physical Exam   Triage Vital Signs: ED Triage Vitals  Encounter Vitals Group     BP 12/01/23 1703 (!) 149/103     Girls Systolic BP Percentile --      Girls Diastolic BP Percentile --      Boys Systolic BP Percentile --      Boys Diastolic BP Percentile --      Pulse Rate 12/01/23 1703 89     Resp 12/01/23 1703 17     Temp 12/01/23 1703 97.7 F (36.5 C)     Temp src --      SpO2 12/01/23 1703 97 %     Weight 12/01/23 1705 190 lb (86.2 kg)     Height 12/01/23 1705 5' 11.5 (1.816 m)     Head Circumference --      Peak Flow --      Pain Score 12/01/23 1705 6     Pain Loc --      Pain Education --      Exclude from Growth Chart --     Most recent vital signs: Vitals:   12/01/23 1703 12/01/23 1707  BP: (!) 149/103 (!) 148/98  Pulse: 89   Resp: 17   Temp: 97.7 F (36.5 C)   SpO2: 97%      General: Awake, interactive  CV:  Regular rate, good peripheral perfusion.  Resp:  Unlabored respirations.  Abd:  Nondistended.  Neuro:  Symmetric facial movement, fluid speech MSK:  There is tenderness and swelling primarily along the medial aspect of the left plantar foot near the midfoot.  There is no overlying ecchymosis.  There are intact DP pulses bilaterally.  Intact dorsiflexion and plantarflexion.  Negative Thompson test.  Intact sensation throughout the digits.  Patient is able to fully range his  toes, but does report some pain with active flexion of the great toe, no significant pain with passive flexion.   ED Results / Procedures / Treatments   Labs (all labs ordered are listed, but only abnormal results are displayed) Labs Reviewed - No data to display   EKG EKG independently reviewed and interpreted by myself demonstrates:    RADIOLOGY Imaging independently reviewed and interpreted by myself demonstrates:  Foot x-Lataya Varnell without acute fracture, calcaneal heel spur noted  Formal Radiology Read:  DG Foot Complete Left Result Date: 12/01/2023 CLINICAL DATA:  pain after feeling a snap in bottom of foot EXAM: LEFT FOOT - COMPLETE 3+ VIEW COMPARISON:  Jul 15, 2023 FINDINGS: No acute fracture or dislocation. Undersurface calcaneal heel spur. Soft tissues are unremarkable. No radiopaque foreign body. IMPRESSION: No acute fracture or dislocation. Electronically Signed   By: Rogelia Myers M.D.   On: 12/01/2023 17:47    PROCEDURES:  Critical Care performed: No  Procedures   MEDICATIONS ORDERED IN ED: Medications -  No data to display   IMPRESSION / MDM / ASSESSMENT AND PLAN / ED COURSE  I reviewed the triage vital signs and the nursing notes.  Differential diagnosis includes, but is not limited to, fracture, dislocation, soft tissue injury, ligamentous injury, Achilles tear  Patient's presentation is most consistent with acute complicated illness / injury requiring diagnostic workup.  63 year old male presenting to the emergency department for evaluation of foot pain.  Neurovascularly intact on exam.  No evidence of Achilles tendon rupture.  Without bony injury.  Suspect likely ligamentous injury based on clinical history.  Patient does have a foot specialist that he sees and is already arranged for outpatient imaging.  Discussed supportive care measures.  Patient declines pain control here or supportive bracing with postop shoe or boot.  Strict return precautions provided.   Patient discharged stable condition.      FINAL CLINICAL IMPRESSION(S) / ED DIAGNOSES   Final diagnoses:  Left foot pain     Rx / DC Orders   ED Discharge Orders     None        Note:  This document was prepared using Dragon voice recognition software and may include unintentional dictation errors.   Levander Slate, MD 12/01/23 [phone]

## 2023-12-01 NOTE — ED Triage Notes (Signed)
 Pt arrives via POV. PT reports he is concerned a tendon may have torn in his foot. PT reports earlier today, he felt a snap in his foot. Reports hx of plantar fasciitis and bone spur. Pt AxOx4.

## 2023-12-01 NOTE — ED Provider Triage Note (Signed)
 Emergency Medicine Provider Triage Evaluation Note  Ronald Ellis , a 63 y.o. male  was evaluated in triage.  Pt complains of left foot pain. History of plantar fasciitis.  Evaluated by podiatry about a week ago and given an injection.  Today felt a snap in the bottom of the foot and now has a sensation of a ball just above the heel.  Continues to have range of motion of the toes and ankle.  Physical Exam  BP (!) 149/103   Pulse 89   Temp 97.7 F (36.5 C)   Resp 17   SpO2 97%  Gen:   Awake, no distress   Resp:  Normal effort  MSK:   Moves extremities without difficulty  Other:    Medical Decision Making  Medically screening exam initiated at 5:05 PM.  Appropriate orders placed.  Ronald Ellis was informed that the remainder of the evaluation will be completed by another provider, this initial triage assessment does not replace that evaluation, and the importance of remaining in the ED until their evaluation is complete.    Herlinda Kirk NOVAK, FNP 12/01/23 2013

## 2023-12-01 NOTE — Discharge Instructions (Signed)
 You are seen in the ER today for your foot pain.  Your x-Ronald Ellis did not show any bony injuries.  I agree that you may have a ligamentous injury in your foot.  You can use over-the-counter medication to help with your pain.  Keep your scheduled follow-up with your foot specialist.  Return to the ER for any new or worsening symptoms.

## 2023-12-04 ENCOUNTER — Ambulatory Visit (INDEPENDENT_AMBULATORY_CARE_PROVIDER_SITE_OTHER): Admitting: Family Medicine

## 2023-12-04 ENCOUNTER — Encounter: Payer: Self-pay | Admitting: Family Medicine

## 2023-12-04 VITALS — BP 114/76 | HR 87 | Resp 16 | Ht 71.5 in | Wt 195.7 lb

## 2023-12-04 DIAGNOSIS — J432 Centrilobular emphysema: Secondary | ICD-10-CM

## 2023-12-04 DIAGNOSIS — I700 Atherosclerosis of aorta: Secondary | ICD-10-CM

## 2023-12-04 DIAGNOSIS — J849 Interstitial pulmonary disease, unspecified: Secondary | ICD-10-CM

## 2023-12-04 DIAGNOSIS — G2581 Restless legs syndrome: Secondary | ICD-10-CM

## 2023-12-04 DIAGNOSIS — G43001 Migraine without aura, not intractable, with status migrainosus: Secondary | ICD-10-CM

## 2023-12-04 DIAGNOSIS — F325 Major depressive disorder, single episode, in full remission: Secondary | ICD-10-CM | POA: Diagnosis not present

## 2023-12-04 DIAGNOSIS — R7989 Other specified abnormal findings of blood chemistry: Secondary | ICD-10-CM

## 2023-12-04 DIAGNOSIS — R718 Other abnormality of red blood cells: Secondary | ICD-10-CM | POA: Diagnosis not present

## 2023-12-04 MED ORDER — RIZATRIPTAN BENZOATE 10 MG PO TABS: 10.0000 mg | ORAL_TABLET | ORAL | 0 refills | Status: AC | PRN

## 2023-12-04 MED ORDER — DULOXETINE HCL 30 MG PO CPEP
30.0000 mg | ORAL_CAPSULE | Freq: Every day | ORAL | 1 refills | Status: AC
Start: 2023-12-04 — End: ?

## 2023-12-04 NOTE — Progress Notes (Signed)
 Name: Ronald Ellis   MRN: [id number]    DOB: 07/04/1960   Date:12/04/2023       Progress Note  Subjective  Chief Complaint  Chief Complaint  Patient presents with   Medical Management of Chronic Issues   Discussed the use of AI scribe software for clinical note transcription with the patient, who gave verbal consent to proceed.  History of Present Illness Ronald Ellis is a 63 year old male with COPD and interstitial lung disease who presents for follow-up on lung health and medication management.  He has a history of COPD and interstitial lung disease, with CT scans showing cystic changes and honeycombing. A PET scan ruled out nodular opacity in the right upper lobe. He is undergoing an overnight oxygen  study due to insurance requirements. He experiences shortness of breath and significant oxygen  desaturation during previous overnight studies without CPAP, with levels dropping below 76%.  He has obstructive sleep apnea and uses a CPAP machine. No recent exacerbations have occurred.  He is taking duloxetine  30 mg for depression, which effectively maintains his mood. Attempts to reduce the dose were ineffective, so he continues with the current regimen.   He also receives testosterone  injections managed by his urologist, with recent levels at 406, elevation of HCT in the past but last labs normalized  He has hypercholesterolemia with a previous cholesterol level of 168. He is not interested in taking  cholesterol medication  He experiences migraines without aura, occurring seasonally in midsummer or late fall. No recent migraines have occurred, and he keeps Maxalt  on hand for potential future episodes.  He reports a recent episode of plantar fasciitis, resulting in a 'full snap' and requiring an emergency room visit. He is awaiting an MRI and has a follow-up appointment scheduled. Pain is managed with Advil, taking two tablets in the morning and two in the evening.  He has a history of  restless legs but reports improvement after discontinuing pregabalin . He no longer takes medications like Spiriva , trazodone , or Breo, and only uses albuterol  as needed for respiratory issues.  He reports good emotional health and denies any current issues with depression or sleep. He does not take hydroxyzine  for anxiety or pregabalin  for restless legs anymore. He denies needing medication for sleep.    Patient Active Problem List   Diagnosis Date Noted   Lower urinary tract symptoms (LUTS) 08/14/2023   Positive colorectal cancer screening using Cologuard test 09/15/2022   Polyp of sigmoid colon 09/15/2022   Emphysema lung (HCC) 08/12/2021   Nocturnal hypoxia 06/25/2020   Migraine without aura and without status migrainosus, not intractable 05/02/2020   Seasonal affective disorder 05/02/2020   Chest pain of uncertain etiology 06/24/2019   Atherosclerosis of aorta 12/17/2018   Personal history of tobacco use, presenting hazards to health 12/16/2018   History of drug dependence/abuse (HCC) 11/26/2018   Celiac disease 02/05/2018   Right-sided low back pain with right-sided sciatica 12/05/2015   Plantar fasciitis of right foot 10/15/2015   Chronic neck and back pain 07/19/2015    Past Surgical History:  Procedure Laterality Date   BACK SURGERY  2015   BACK SURGERY     COLONOSCOPY WITH PROPOFOL  N/A 09/15/2022   Procedure: COLONOSCOPY WITH PROPOFOL ;  Surgeon: Jinny Carmine, MD;  Location: [facility];  Service: Endoscopy;  Laterality: N/A;   INGUINAL HERNIA REPAIR Bilateral    multiple times   lumbar discetomy  2005   MOLE REMOVAL     NECK SURGERY  2016  SPINE SURGERY  12/15/01    Family History  Problem Relation Age of Onset   Hypertension Father    Lung disease Father    Hypertension Brother    Lung cancer Maternal Grandfather     Social History   Tobacco Use   Smoking status: Former    Current packs/day: 0.00    Average packs/day: 1 pack/day for 42.0 years  (42.0 ttl pk-yrs)    Types: Cigarettes, Cigars    Start date: 38    Quit date: 12/13/2011    Years since quitting: 11.9   Smokeless tobacco: Never   Tobacco comments:    09/09/22 - may have occasional cigar  Substance Use Topics   Alcohol use: Not Currently    Comment: quit 5-6 years ago, only beer - but glutten free diet now     Current Outpatient Medications:    albuterol  (VENTOLIN  HFA) 108 (90 Base) MCG/ACT inhaler, Inhale 2 puffs into the lungs every 6 (six) hours as needed for wheezing or shortness of breath., Disp: , Rfl:    Ascorbic Acid (VITAMIN C PO), Take by mouth., Disp: , Rfl:    DULoxetine  (CYMBALTA ) 30 MG capsule, Take 1 capsule (30 mg total) by mouth daily., Disp: 90 capsule, Rfl: 1   L-Citrulline POWD, by Does not apply route daily., Disp: , Rfl:    MAGNESIUM PO, Take by mouth., Disp: , Rfl:    Misc Natural Products (MAGIC MUSHROOM MIX) CAPS, Take 2 capsules by mouth daily., Disp: , Rfl:    MORINGA OLEIFERA PO, Take 1,200 mg by mouth daily at 12 noon., Disp: , Rfl:    rizatriptan  (MAXALT ) 10 MG tablet, Take 1 tablet (10 mg total) by mouth as needed for migraine. May repeat in 2 hours if needed, Disp: 10 tablet, Rfl: 0   saw palmetto 80 MG capsule, Take 80 mg by mouth daily., Disp: , Rfl:    testosterone  cypionate (DEPOTESTOSTERONE CYPIONATE) 200 MG/ML injection, ADMINISTER 1 ML(200 MG) IN THE MUSCLE EVERY 14 DAYS, Disp: 4 mL, Rfl: 1   zinc gluconate 50 MG tablet, Take 50 mg by mouth daily., Disp: , Rfl:   Allergies  Allergen Reactions   Statins Other (See Comments)    Fever, chills, body aches   Gluten Meal Diarrhea and Nausea Only    I personally reviewed active problem list, medication list, allergies, family history with the patient/caregiver today.   ROS  Ten systems reviewed and is negative except as mentioned in HPI    Objective Physical Exam CONSTITUTIONAL: Patient appears well-developed and well-nourished.  No distress. HEENT: Head atraumatic,  normocephalic, neck supple. CARDIOVASCULAR: Normal rate, regular rhythm and normal heart sounds.  No murmur heard. No BLE edema. PULMONARY: Effort normal and breath sounds normal. No respiratory distress. MUSCULOSKELETAL: Normal gait. Without gross motor or sensory deficit. PSYCHIATRIC: Patient has a normal mood and affect. behavior is normal. Judgment and thought content normal.  Vitals:   12/04/23 1014  BP: 114/76  Pulse: 87  Resp: 16  SpO2: 98%  Weight: 195 lb 11.2 oz (88.8 kg)  Height: 5' 11.5 (1.816 m)    Body mass index is 26.91 kg/m.  Recent Results (from the past 2160 hours)  Testosterone      Status: None   Collection Time: 10/06/23 11:18 AM  Result Value Ref Range   Testosterone  406 264 - 916 ng/dL    Comment: Adult male reference interval is based on a population of healthy nonobese males (BMI <30) between 19 and 39 years  old. [place] 7982,897;8838-8826. PMID: 71675896.     Diabetic Foot Exam:     PHQ2/9:    12/04/2023   10:12 AM 11/27/2023    8:26 AM 08/14/2023   10:46 AM 06/04/2023    8:28 AM 08/13/2022   11:26 AM  Depression screen PHQ 2/9  Decreased Interest 0 0 0 0 0  Down, Depressed, Hopeless 0 0 0 0 0  PHQ - 2 Score 0 0 0 0 0  Altered sleeping 0 0  0   Tired, decreased energy 0 0  0   Change in appetite 0 0  0   Feeling bad or failure about yourself  0 0  0   Trouble concentrating 0 0  0   Moving slowly or fidgety/restless 0 0  0   Suicidal thoughts 0 0  0   PHQ-9 Score 0 0  0   Difficult doing work/chores Not difficult at all Not difficult at all  Not difficult at all     phq 9 is negative  Fall Risk:    12/04/2023   10:11 AM 11/23/2023    7:39 PM 08/14/2023   10:46 AM 08/13/2022   11:26 AM 08/08/2022    2:17 PM  Fall Risk   Falls in the past year? 1 1 0 0 0  Number falls in past yr: 1 1 0 0 0  Injury with Fall? 0 0 0 0 0  Risk for fall due to : History of fall(s) History of fall(s) No Fall Risks No Fall Risks   Follow up   Falls evaluation completed;Education provided;Falls prevention discussed Falls prevention discussed;Education provided;Falls evaluation completed Falls prevention discussed      Assessment & Plan Chronic obstructive pulmonary disease (COPD) and interstitial lung disease COPD and interstitial lung disease with cystic changes and honeycombing on CT. No nodular opacity. No exacerbation. Oxygen  therapy not approved despite low overnight levels. - Continue albuterol  as needed. - Follow insurance recommendations for oxygen  study.  Obstructive sleep apnea Obstructive sleep apnea managed with CPAP. Significant oxygen  drop during sleep without CPAP. - Continue CPAP therapy. - Follow insurance recommendations for overnight oxygen  study.  Plantar fasciitis, right foot Right foot plantar fasciitis with recent full snap. Pain managed with Advil. Awaiting MRI results and follow-up with Dr. Verta. - Take Advil as needed for pain. - Await MRI results. - Follow up with Dr. Verta.  Major depressive disorder, recurrent, in remission Major depressive disorder in remission with duloxetine  30 mg. Reports feeling good, no dose change desired. - Continue duloxetine  30 mg daily.  Migraine without aura Seasonal migraines, no recent episodes. Maxalt  available if needed. - Ensure Maxalt  is available for use as needed.  Hypercholesterolemia Hypercholesterolemia with LDL at 168.  Testosterone  deficiency on replacement therapy Testosterone  deficiency managed with injections. Last level 406. Monitored by [facility]. - Continue testosterone  injections as prescribed by urologist.  Elevated HCT Recently checked by urologist and improved

## 2023-12-09 ENCOUNTER — Ambulatory Visit

## 2023-12-09 DIAGNOSIS — M66872 Spontaneous rupture of other tendons, left ankle and foot: Secondary | ICD-10-CM | POA: Diagnosis not present

## 2023-12-09 DIAGNOSIS — M722 Plantar fascial fibromatosis: Secondary | ICD-10-CM | POA: Diagnosis not present

## 2023-12-09 NOTE — Progress Notes (Signed)
 Subjective:  Patient ID: Ronald Ellis, male    DOB: 07-27-1960,  MRN: [id number]  Chief Complaint  Patient presents with   Foot Pain    left foot injury. He had an injection x 2 weeks ago with Dr. Verta for plantar fasciitis. He heard a pop and felt pain about a week ago. ER visit on last Thursday. Xray taken.    63 y.o. male presents with the above complaint.  He had an injection 2 weeks ago with Dr. Verta for plantar fasciitis.  He was doing well until 1 week ago when he noticed a popping sensation in the bottom of his foot in the area of his previous plantar fascial pain.  He denies any trauma to the area.  He went to the ER last Thursday where they took x-rays that did not show any fracture.  He does wear orthotics that he states helped.  Review of Systems: Negative except as noted in the HPI. Denies N/V/F/Ch.  Past Medical History:  Diagnosis Date   Allergy    Statins, gluten   Arthritis    Chronic pain    COPD (chronic obstructive pulmonary disease) (HCC)    Depression 08/06/1981   Gluten intolerance    Oxygen  deficiency    Pneumonia    Sleep apnea     Current Outpatient Medications:    albuterol  (VENTOLIN  HFA) 108 (90 Base) MCG/ACT inhaler, Inhale 2 puffs into the lungs every 6 (six) hours as needed for wheezing or shortness of breath., Disp: , Rfl:    Ascorbic Acid (VITAMIN C PO), Take by mouth., Disp: , Rfl:    DULoxetine  (CYMBALTA ) 30 MG capsule, Take 1 capsule (30 mg total) by mouth daily., Disp: 90 capsule, Rfl: 1   L-Citrulline POWD, by Does not apply route daily., Disp: , Rfl:    MAGNESIUM PO, Take by mouth., Disp: , Rfl:    Misc Natural Products (MAGIC MUSHROOM MIX) CAPS, Take 2 capsules by mouth daily., Disp: , Rfl:    MORINGA OLEIFERA PO, Take 1,200 mg by mouth daily at 12 noon., Disp: , Rfl:    rizatriptan  (MAXALT ) 10 MG tablet, Take 1 tablet (10 mg total) by mouth as needed for migraine. May repeat in 2 hours if needed, Disp: 10 tablet, Rfl: 0   saw palmetto  80 MG capsule, Take 80 mg by mouth daily., Disp: , Rfl:    testosterone  cypionate (DEPOTESTOSTERONE CYPIONATE) 200 MG/ML injection, ADMINISTER 1 ML(200 MG) IN THE MUSCLE EVERY 14 DAYS, Disp: 4 mL, Rfl: 1   zinc gluconate 50 MG tablet, Take 50 mg by mouth daily., Disp: , Rfl:   Social History   Tobacco Use  Smoking Status Former   Current packs/day: 0.00   Average packs/day: 1 pack/day for 42.0 years (42.0 ttl pk-yrs)   Types: Cigarettes, Cigars   Start date: 80   Quit date: 12/13/2011   Years since quitting: 11.9  Smokeless Tobacco Never  Tobacco Comments   09/09/22 - may have occasional cigar    Allergies  Allergen Reactions   Statins Other (See Comments)    Fever, chills, body aches   Gluten Meal Diarrhea and Nausea Only   Objective:  There were no vitals filed for this visit. There is no height or weight on file to calculate BMI. Constitutional Well developed. Well nourished. Oriented to person, place, and time.  Vascular Dorsalis pedis pulses palpable bilaterally. Posterior tibial pulses palpable bilaterally. Capillary refill normal to all digits.  No cyanosis or clubbing noted. Pedal  hair growth normal.  Neurologic Normal speech. Epicritic sensation to light touch grossly present bilaterally. Negative tinel sign at tarsal tunnel bilaterally.   Dermatologic Skin texture and turgor are within normal limits.  No open wounds. No skin lesions.  Musculoskeletal: 5 out of 5 muscle strength to all dorsiflexors, everters, inverters.  Decreased muscle strength to FDL, may be guarding due to pain in area of tarsal tunnel.  There is significant pain to palpation in the distal tarsal tunnel and area of plantar fascia origin/abductor hallucis.  No clearly palpable dell, palpation exam limited due to significant discomfort.  Rectus foot shape.   Radiographs: XR from ED 12/01/23 IMPRESSION: No acute fracture or dislocation.  Assessment:   1. Plantar fasciitis of left foot   2.  Nontraumatic rupture of tendon of left foot    Plan:  - Patient was evaluated and treated and all questions answered.  Chronic left heel pain/Plantar fasciitis - Discussed with patient his current condition.  Based on sensation of hearing a pop combined with his clinical presentation and area of pain, I do recommend that we order an MRI to evaluate his plantar fascia as well as his FDL tendon.  I do feel that his FDL tendon is guarding from pain, limiting his strength.  However I would like to visualize this tendon while we are evaluating his plantar fascia.  Recommend MRI for imaging.  Ordered MRI left foot and left ankle.  With his ongoing chronic heel pain, MRI was going to be considered prior to him experiencing this popping sensation.  - I discussed with patient recommendation to stabilize the foot utilizing a cam walker which was fitted for and dispensed today.  We did discuss that if his plantar fascia is ruptured, supporting the foot and arch utilizing orthotic and CAM Vannie is indicated. - Return to clinic after MRI is completed   Prentice Ovens, [facility] Fellowship Trained Podiatric Surgeon [facility]

## 2023-12-15 ENCOUNTER — Ambulatory Visit: Admitting: Internal Medicine

## 2023-12-16 ENCOUNTER — Ambulatory Visit
Admission: RE | Admit: 2023-12-16 | Discharge: 2023-12-16 | Disposition: A | Discharge status: Home / Self Care | Source: Ambulatory Visit

## 2023-12-16 DIAGNOSIS — M66872 Spontaneous rupture of other tendons, left ankle and foot: Secondary | ICD-10-CM

## 2023-12-16 DIAGNOSIS — M722 Plantar fascial fibromatosis: Secondary | ICD-10-CM | POA: Insufficient documentation

## 2023-12-16 DIAGNOSIS — G4733 Obstructive sleep apnea (adult) (pediatric): Secondary | ICD-10-CM | POA: Diagnosis not present

## 2023-12-16 DIAGNOSIS — M79672 Pain in left foot: Secondary | ICD-10-CM | POA: Diagnosis not present

## 2023-12-16 DIAGNOSIS — M65972 Unspecified synovitis and tenosynovitis, left ankle and foot: Secondary | ICD-10-CM | POA: Diagnosis not present

## 2023-12-23 ENCOUNTER — Encounter: Payer: Self-pay | Admitting: Podiatry

## 2023-12-31 DIAGNOSIS — G4733 Obstructive sleep apnea (adult) (pediatric): Secondary | ICD-10-CM | POA: Diagnosis not present

## 2024-01-04 ENCOUNTER — Ambulatory Visit: Attending: Sleep Medicine

## 2024-01-04 DIAGNOSIS — Z87891 Personal history of nicotine dependence: Secondary | ICD-10-CM | POA: Insufficient documentation

## 2024-01-04 DIAGNOSIS — J439 Emphysema, unspecified: Secondary | ICD-10-CM | POA: Insufficient documentation

## 2024-01-04 DIAGNOSIS — R0902 Hypoxemia: Secondary | ICD-10-CM | POA: Diagnosis not present

## 2024-01-04 DIAGNOSIS — R0683 Snoring: Secondary | ICD-10-CM | POA: Insufficient documentation

## 2024-01-04 DIAGNOSIS — G4733 Obstructive sleep apnea (adult) (pediatric): Secondary | ICD-10-CM

## 2024-01-05 ENCOUNTER — Other Ambulatory Visit: Payer: Self-pay | Admitting: Family Medicine

## 2024-01-05 DIAGNOSIS — F325 Major depressive disorder, single episode, in full remission: Secondary | ICD-10-CM

## 2024-01-11 ENCOUNTER — Ambulatory Visit (INDEPENDENT_AMBULATORY_CARE_PROVIDER_SITE_OTHER): Payer: Self-pay

## 2024-01-11 DIAGNOSIS — G4733 Obstructive sleep apnea (adult) (pediatric): Secondary | ICD-10-CM

## 2024-01-18 NOTE — Progress Notes (Unsigned)
 01/20/2024 11:54 AM   Ronald Ellis December 30, 1960 [id number]  Referring provider: Glenard Mire, MD [address] [place],  [place] [address]  Urological history: 1.  Hypogonadism - Testosterone  level pending - Hemoglobin/hematocrit level pending - Testosterone  cypionate 200 mg/milliliters, 1 cc every 14 days  2. BPH with LU TS - PSA pending   3. Erectile dysfunction  No chief complaint on file.  HPI: Ronald Ellis is a 63 y.o. man who presents today for follow-up.  Previous records reviewed.  He reports good adherence to testosterone  cypionate 200 mg/milliliters, 1 cc every 14 days.  Denies new complaints of low libido, erectile dysfunction, fatigue, or mood changes.  No complaints of gynecomastia, visual changes, or thromboembolic symptoms.  Energy level, libido and overall sense of wellbeing being reported as stable/ improved compared to prior visit.    Testosterone  level pending   Hemoglobin/hematocrit pending  Liver enzymes (06/2023) normal   I PSS ***  He reports sensation of incomplete bladder emptying,   urinary frequency,   urinary intermittency,   urinary urgency,   a weak urinary stream,   having to strain to void,   nocturia x ***,   leaking before being able to reach the restroom,   leaking with coughing,   leaking without awareness,   and post void dribbling.     He is wearing *** pads//depends  daily.    Patient denies any modifying or aggravating factors.  Patient denies any recent UTI's, gross hematuria, dysuria or suprapubic/flank pain.  Patient denies any fevers, chills, nausea or vomiting.  ***  He has a family history of PCa, colon cancer, ovarian cancer and/or breast cancer with ***.   He does not have a family history of PCa, colon cancer, ovarian cancer, and/or breast cancer .***     PSA pending   Serum creatinine (06/2023) 0.90, eGFR 97  Fluid consumption: ***  SHIM ***  He does not have confidence that he  could get and keep an erection, his erections are not firm enough for penetrative intercourse, he has difficulty maintaining his erections,  and he is not finding intercourse satisfactory for him.  ***  Patient still having spontaneous erections.  ***   He denies any pain or curvature with erections.    He is not able to ejaculate, has pain with ejaculation, and has blood in his ejaculate fluid.   ***  Testosterone  level pending   Cholesterol (06/2023) elevated total cholesterol, elevated LDL, elevated cholesterol/HDL level and elevated non-HDL cholesterol   Tried and failed ***  PMH: Past Medical History:  Diagnosis Date   Allergy    Statins, gluten   Arthritis    Chronic pain    COPD (chronic obstructive pulmonary disease) (HCC)    Depression 08/06/1981   Gluten intolerance    Oxygen  deficiency    Pneumonia    Sleep apnea     Surgical History: Past Surgical History:  Procedure Laterality Date   BACK SURGERY  2015   BACK SURGERY     COLONOSCOPY WITH PROPOFOL  N/A 09/15/2022   Procedure: COLONOSCOPY WITH PROPOFOL ;  Surgeon: Jinny Carmine, MD;  Location: [facility];  Service: Endoscopy;  Laterality: N/A;   INGUINAL HERNIA REPAIR Bilateral    multiple times   lumbar discetomy  2005   MOLE REMOVAL     NECK SURGERY  2016   SPINE SURGERY  12/15/01    Home Medications:  Allergies as of 01/20/2024       Reactions  Statins Other (See Comments)   Fever, chills, body aches   Gluten Meal Diarrhea, Nausea Only        Medication List        Accurate as of January 18, 2024 11:54 AM. If you have any questions, ask your nurse or doctor.          albuterol  108 (90 Base) MCG/ACT inhaler Commonly known as: VENTOLIN  HFA Inhale 2 puffs into the lungs every 6 (six) hours as needed for wheezing or shortness of breath.   DULoxetine  30 MG capsule Commonly known as: Cymbalta  Take 1 capsule (30 mg total) by mouth daily.   L-Citrulline Powd by Does not apply route  daily.   Magic Mushroom Mix Caps Take 2 capsules by mouth daily.   MAGNESIUM PO Take by mouth.   MORINGA OLEIFERA PO Take 1,200 mg by mouth daily at 12 noon.   rizatriptan  10 MG tablet Commonly known as: Maxalt  Take 1 tablet (10 mg total) by mouth as needed for migraine. May repeat in 2 hours if needed   saw palmetto 80 MG capsule Take 80 mg by mouth daily.   testosterone  cypionate 200 MG/ML injection Commonly known as: DEPOTESTOSTERONE CYPIONATE ADMINISTER 1 ML(200 MG) IN THE MUSCLE EVERY 14 DAYS   VITAMIN C PO Take by mouth.   zinc gluconate 50 MG tablet Take 50 mg by mouth daily.        Allergies:  Allergies  Allergen Reactions   Statins Other (See Comments)    Fever, chills, body aches   Gluten Meal Diarrhea and Nausea Only    Family History: Family History  Problem Relation Age of Onset   Hypertension Father    Lung disease Father    Hypertension Brother    Lung cancer Maternal Grandfather     Social History:  reports that he quit smoking about 12 years ago. His smoking use included cigarettes and cigars. He started smoking about 47 years ago. He has a 42 pack-year smoking history. He has never used smokeless tobacco. He reports that he does not currently use alcohol. He reports that he does not currently use drugs after having used the following drugs: Crack cocaine, Cocaine, Hashish, Heroin, Hydrocodone , LSD, Marijuana, Oxycodone , and Other-see comments.  ROS: Pertinent ROS in HPI  Physical Exam: There were no vitals taken for this visit.  Constitutional:  Well nourished. Alert and oriented, No acute distress. HEENT: [place] AT, moist mucus membranes.  Trachea midline, no masses. Cardiovascular: No clubbing, cyanosis, or edema. Respiratory: Normal respiratory effort, no increased work of breathing. GI: Abdomen is soft, non tender, non distended, no abdominal masses. Liver and spleen not palpable.  No hernias appreciated.  Stool sample for occult  testing is not indicated.   GU: No CVA tenderness.  No bladder fullness or masses.  Patient with circumcised/uncircumcised phallus. ***Foreskin easily retracted***  Urethral meatus is patent.  No penile discharge. No penile lesions or rashes. Scrotum without lesions, cysts, rashes and/or edema.  Testicles are located scrotally bilaterally. No masses are appreciated in the testicles. Left and right epididymis are normal. Rectal: Patient with  normal sphincter tone. Anus and perineum without scarring or rashes. No rectal masses are appreciated. Prostate is approximately *** grams, *** nodules are appreciated. Seminal vesicles are normal. Skin: No rashes, bruises or suspicious lesions. Lymph: No cervical or inguinal adenopathy. Neurologic: Grossly intact, no focal deficits, moving all 4 extremities. Psychiatric: Normal mood and affect.  Laboratory Data: See Epic and HPI   I have  reviewed the labs.   Pertinent Imaging: N/A  Assessment & Plan:  ***  1. Hypogonadism  -testosterone  levels are pending  -H & H pending  -continue testosterone  cypionate 200 mg/milliliters, 1 cc every 14 days until laboratory results are available and we will adjust if necessary  2. BPH with LUTS -PSA pending  -DRE benign -symptoms - *** -most bothersome symptoms are *** -continue conservative management, avoiding bladder irritants and timed voiding's -Initiate alpha-blocker (***), discussed side effects *** -Initiate 5 alpha reductase inhibitor (***), discussed side effects *** -Continue tamsulosin  0.4 mg daily, alfuzosin 10 mg daily, Rapaflo 8 mg daily, terazosin, doxazosin, Cialis 5 mg daily and finasteride 5 mg daily, dutasteride 0.5 mg daily***:refills given -Cannot tolerate medication or medication failure, schedule cystoscopy ***  3. Erectile dysfunction:    - I explained that conditions like diabetes, hypertension, coronary artery disease, peripheral vascular disease, smoking, alcohol consumption, age,  sleep apnea and BPH can diminish the ability to have an erection - I explained the ED may be a risk marker for underlying CVD and he should follow up with PCP for further studies *** - A recent study published in Sex Med 2018 Apr 13 revealed moderate to vigorous aerobic exercise for 40 minutes 4 times per week can decrease erectile problems caused by physical inactivity, obesity, hypertension, metabolic syndrome and/or cardiovascular diseases *** - We discussed trying a *** different PDE5 inhibitor, intra-urethral suppositories, intracavernous vasoactive drug injection therapy, vacuum erection devices and penile prosthesis implantation   No follow-ups on file.  These notes generated with voice recognition software. I apologize for typographical errors.  CLOTILDA HELON RIGGERS  [facility] [address]  [address] [place], [place] [address] [phone]

## 2024-01-20 ENCOUNTER — Encounter: Payer: Self-pay | Admitting: Urology

## 2024-01-20 ENCOUNTER — Ambulatory Visit: Admitting: Urology

## 2024-01-20 VITALS — BP 125/85 | HR 68 | Ht 72.0 in | Wt 195.0 lb

## 2024-01-20 DIAGNOSIS — N529 Male erectile dysfunction, unspecified: Secondary | ICD-10-CM

## 2024-01-20 DIAGNOSIS — N138 Other obstructive and reflux uropathy: Secondary | ICD-10-CM | POA: Diagnosis not present

## 2024-01-20 DIAGNOSIS — N401 Enlarged prostate with lower urinary tract symptoms: Secondary | ICD-10-CM

## 2024-01-20 DIAGNOSIS — E291 Testicular hypofunction: Secondary | ICD-10-CM

## 2024-01-21 ENCOUNTER — Ambulatory Visit: Payer: Self-pay | Admitting: Urology

## 2024-01-21 LAB — PSA: Prostate Specific Ag, Serum: 1.9 ng/mL (ref 0.0–4.0)

## 2024-01-21 LAB — HEMOGLOBIN AND HEMATOCRIT, BLOOD
Hematocrit: 56.8 % — ABNORMAL HIGH (ref 37.5–51.0)
Hemoglobin: 18.3 g/dL — ABNORMAL HIGH (ref 13.0–17.7)

## 2024-01-21 LAB — TESTOSTERONE: Testosterone: 239 ng/dL — ABNORMAL LOW (ref 264–916)

## 2024-03-17 ENCOUNTER — Telehealth: Payer: Self-pay

## 2024-03-17 DIAGNOSIS — L709 Acne, unspecified: Secondary | ICD-10-CM

## 2024-03-17 NOTE — Telephone Encounter (Signed)
 Copied from CRM #[id number]. Topic: Referral - Request for Referral >> Mar 17, 2024  9:52 AM Darshell M wrote: Did the patient discuss referral with their provider in the last year? Yes (If No - schedule appointment) (If Yes - send message)  Appointment offered? No  Type of order/referral and detailed reason for visit: Dermatologist  Preference of office, provider, location: Dr. Arlyss in [facility]  If referral order, have you been seen by this specialty before? Yes (If Yes, this issue or another issue? When? Where?  Can we respond through MyChart? Yes

## 2024-03-22 NOTE — Telephone Encounter (Unsigned)
 Copied from CRM [id number]. Topic: Referral - Question >> Mar 22, 2024 11:09 AM Larissa RAMAN wrote: Reason for CRM: Sarah with [facility] requesting a callback from referral coordinator Callback # [phone] Ext. [phone]

## 2024-06-03 ENCOUNTER — Ambulatory Visit: Admitting: Family Medicine

## 2024-08-15 ENCOUNTER — Encounter: Admitting: Family Medicine

## 2024-12-02 ENCOUNTER — Ambulatory Visit
